# Patient Record
Sex: Female | Born: 1950 | Race: White | Hispanic: No | Smoking: Former smoker
Health system: Southern US, Community
[De-identification: ages and names within clinical notes are randomized; demographics above are authoritative.]

## PROBLEM LIST (undated history)

## (undated) DIAGNOSIS — I739 Peripheral vascular disease, unspecified: Secondary | ICD-10-CM

## (undated) DIAGNOSIS — Z8719 Personal history of other diseases of the digestive system: Secondary | ICD-10-CM

## (undated) DIAGNOSIS — F32A Depression, unspecified: Secondary | ICD-10-CM

## (undated) DIAGNOSIS — Z5189 Encounter for other specified aftercare: Secondary | ICD-10-CM

## (undated) DIAGNOSIS — G43909 Migraine, unspecified, not intractable, without status migrainosus: Secondary | ICD-10-CM

## (undated) DIAGNOSIS — I10 Essential (primary) hypertension: Secondary | ICD-10-CM

## (undated) DIAGNOSIS — M549 Dorsalgia, unspecified: Secondary | ICD-10-CM

## (undated) DIAGNOSIS — K219 Gastro-esophageal reflux disease without esophagitis: Secondary | ICD-10-CM

## (undated) DIAGNOSIS — Q8901 Asplenia (congenital): Secondary | ICD-10-CM

## (undated) DIAGNOSIS — F4310 Post-traumatic stress disorder, unspecified: Secondary | ICD-10-CM

## (undated) DIAGNOSIS — G4730 Sleep apnea, unspecified: Secondary | ICD-10-CM

## (undated) DIAGNOSIS — G4733 Obstructive sleep apnea (adult) (pediatric): Secondary | ICD-10-CM

## (undated) DIAGNOSIS — R739 Hyperglycemia, unspecified: Secondary | ICD-10-CM

## (undated) DIAGNOSIS — F329 Major depressive disorder, single episode, unspecified: Secondary | ICD-10-CM

## (undated) DIAGNOSIS — IMO0001 Reserved for inherently not codable concepts without codable children: Secondary | ICD-10-CM

## (undated) DIAGNOSIS — E669 Obesity, unspecified: Secondary | ICD-10-CM

## (undated) DIAGNOSIS — R011 Cardiac murmur, unspecified: Secondary | ICD-10-CM

## (undated) DIAGNOSIS — G8929 Other chronic pain: Secondary | ICD-10-CM

## (undated) DIAGNOSIS — E785 Hyperlipidemia, unspecified: Secondary | ICD-10-CM

## (undated) DIAGNOSIS — K5900 Constipation, unspecified: Secondary | ICD-10-CM

## (undated) DIAGNOSIS — M1990 Unspecified osteoarthritis, unspecified site: Secondary | ICD-10-CM

## (undated) DIAGNOSIS — F419 Anxiety disorder, unspecified: Secondary | ICD-10-CM

## (undated) HISTORY — DX: Anxiety disorder, unspecified: F41.9

## (undated) HISTORY — DX: Hyperglycemia, unspecified: R73.9

## (undated) HISTORY — DX: Gastro-esophageal reflux disease without esophagitis: K21.9

## (undated) HISTORY — DX: Sleep apnea, unspecified: G47.30

## (undated) HISTORY — DX: Obesity, unspecified: E66.9

## (undated) HISTORY — DX: Hyperlipidemia, unspecified: E78.5

## (undated) HISTORY — DX: Post-traumatic stress disorder, unspecified: F43.10

## (undated) HISTORY — PX: TUBAL LIGATION: SHX77

## (undated) HISTORY — DX: Asplenia (congenital): Q89.01

## (undated) HISTORY — DX: Constipation, unspecified: K59.00

## (undated) HISTORY — PX: COLONOSCOPY: SHX174

## (undated) HISTORY — PX: DILATION AND CURETTAGE OF UTERUS: SHX78

## (undated) HISTORY — PX: HERNIA REPAIR: SHX51

## (undated) HISTORY — PX: EYE SURGERY: SHX253

## (undated) HISTORY — DX: Obstructive sleep apnea (adult) (pediatric): G47.33

## (undated) HISTORY — DX: Essential (primary) hypertension: I10

## (undated) HISTORY — DX: Cardiac murmur, unspecified: R01.1

---

## 1976-09-16 HISTORY — PX: SPLENECTOMY, TOTAL: SHX788

## 1976-09-16 HISTORY — PX: LEG SURGERY: SHX1003

## 1998-03-05 ENCOUNTER — Emergency Department (HOSPITAL_COMMUNITY): Admission: EM | Admit: 1998-03-05 | Discharge: 1998-03-05 | Payer: Self-pay | Admitting: Emergency Medicine

## 1999-09-17 HISTORY — PX: FRACTURE SURGERY: SHX138

## 2000-05-03 ENCOUNTER — Encounter: Payer: Self-pay | Admitting: Emergency Medicine

## 2000-05-04 ENCOUNTER — Encounter: Payer: Self-pay | Admitting: Orthopedic Surgery

## 2000-05-04 ENCOUNTER — Inpatient Hospital Stay (HOSPITAL_COMMUNITY): Admission: EM | Admit: 2000-05-04 | Discharge: 2000-05-07 | Payer: Self-pay | Admitting: Emergency Medicine

## 2000-08-14 ENCOUNTER — Ambulatory Visit (HOSPITAL_COMMUNITY): Admission: RE | Admit: 2000-08-14 | Discharge: 2000-08-14 | Payer: Self-pay | Admitting: Orthopedic Surgery

## 2000-08-14 ENCOUNTER — Encounter: Payer: Self-pay | Admitting: Orthopedic Surgery

## 2000-09-03 ENCOUNTER — Ambulatory Visit (HOSPITAL_COMMUNITY): Admission: RE | Admit: 2000-09-03 | Discharge: 2000-09-03 | Payer: Self-pay | Admitting: Orthopedic Surgery

## 2000-09-03 ENCOUNTER — Encounter: Payer: Self-pay | Admitting: Orthopedic Surgery

## 2000-09-17 ENCOUNTER — Encounter: Payer: Self-pay | Admitting: Orthopedic Surgery

## 2000-09-17 ENCOUNTER — Ambulatory Visit (HOSPITAL_COMMUNITY): Admission: RE | Admit: 2000-09-17 | Discharge: 2000-09-17 | Payer: Self-pay | Admitting: Orthopedic Surgery

## 2000-10-15 ENCOUNTER — Encounter: Payer: Self-pay | Admitting: Orthopedic Surgery

## 2000-10-15 ENCOUNTER — Ambulatory Visit (HOSPITAL_COMMUNITY): Admission: RE | Admit: 2000-10-15 | Discharge: 2000-10-15 | Payer: Self-pay | Admitting: Orthopedic Surgery

## 2001-01-01 ENCOUNTER — Encounter: Payer: Self-pay | Admitting: Family Medicine

## 2001-01-01 ENCOUNTER — Ambulatory Visit (HOSPITAL_COMMUNITY): Admission: RE | Admit: 2001-01-01 | Discharge: 2001-01-01 | Payer: Self-pay | Admitting: Family Medicine

## 2002-04-02 ENCOUNTER — Encounter: Payer: Self-pay | Admitting: Internal Medicine

## 2002-04-02 ENCOUNTER — Ambulatory Visit (HOSPITAL_COMMUNITY): Admission: RE | Admit: 2002-04-02 | Discharge: 2002-04-02 | Payer: Self-pay | Admitting: Internal Medicine

## 2002-04-02 ENCOUNTER — Encounter (INDEPENDENT_AMBULATORY_CARE_PROVIDER_SITE_OTHER): Payer: Self-pay | Admitting: Specialist

## 2002-07-01 ENCOUNTER — Encounter: Payer: Self-pay | Admitting: Orthopedic Surgery

## 2002-07-01 ENCOUNTER — Ambulatory Visit (HOSPITAL_COMMUNITY): Admission: RE | Admit: 2002-07-01 | Discharge: 2002-07-01 | Payer: Self-pay | Admitting: Orthopedic Surgery

## 2002-08-17 ENCOUNTER — Encounter: Admission: RE | Admit: 2002-08-17 | Discharge: 2002-11-15 | Payer: Self-pay | Admitting: Anesthesiology

## 2002-11-22 ENCOUNTER — Encounter: Payer: Self-pay | Admitting: Internal Medicine

## 2002-11-22 ENCOUNTER — Ambulatory Visit (HOSPITAL_COMMUNITY): Admission: RE | Admit: 2002-11-22 | Discharge: 2002-11-22 | Payer: Self-pay | Admitting: Internal Medicine

## 2003-11-07 ENCOUNTER — Encounter: Admission: RE | Admit: 2003-11-07 | Discharge: 2003-11-07 | Payer: Self-pay | Admitting: Family Medicine

## 2003-11-29 ENCOUNTER — Encounter: Admission: RE | Admit: 2003-11-29 | Discharge: 2003-11-29 | Payer: Self-pay | Admitting: Family Medicine

## 2004-01-02 ENCOUNTER — Observation Stay (HOSPITAL_COMMUNITY): Admission: RE | Admit: 2004-01-02 | Discharge: 2004-01-03 | Payer: Self-pay | Admitting: Urology

## 2004-01-09 ENCOUNTER — Emergency Department (HOSPITAL_COMMUNITY): Admission: EM | Admit: 2004-01-09 | Discharge: 2004-01-09 | Payer: Self-pay

## 2004-03-17 ENCOUNTER — Encounter: Admission: RE | Admit: 2004-03-17 | Discharge: 2004-03-17 | Payer: Self-pay | Admitting: Family Medicine

## 2004-07-09 ENCOUNTER — Ambulatory Visit (HOSPITAL_BASED_OUTPATIENT_CLINIC_OR_DEPARTMENT_OTHER): Admission: RE | Admit: 2004-07-09 | Discharge: 2004-07-09 | Payer: Self-pay | Admitting: Family Medicine

## 2004-12-05 IMAGING — CR DG CHEST 1V PORT
1 series · 1 of 1 positions shown · non-contrast
Comparison: [DATE].

CLINICAL DATA: Shortness of breath, chest pain. 
 PORTABLE CHEST - [DATE]:

[view not recorded]
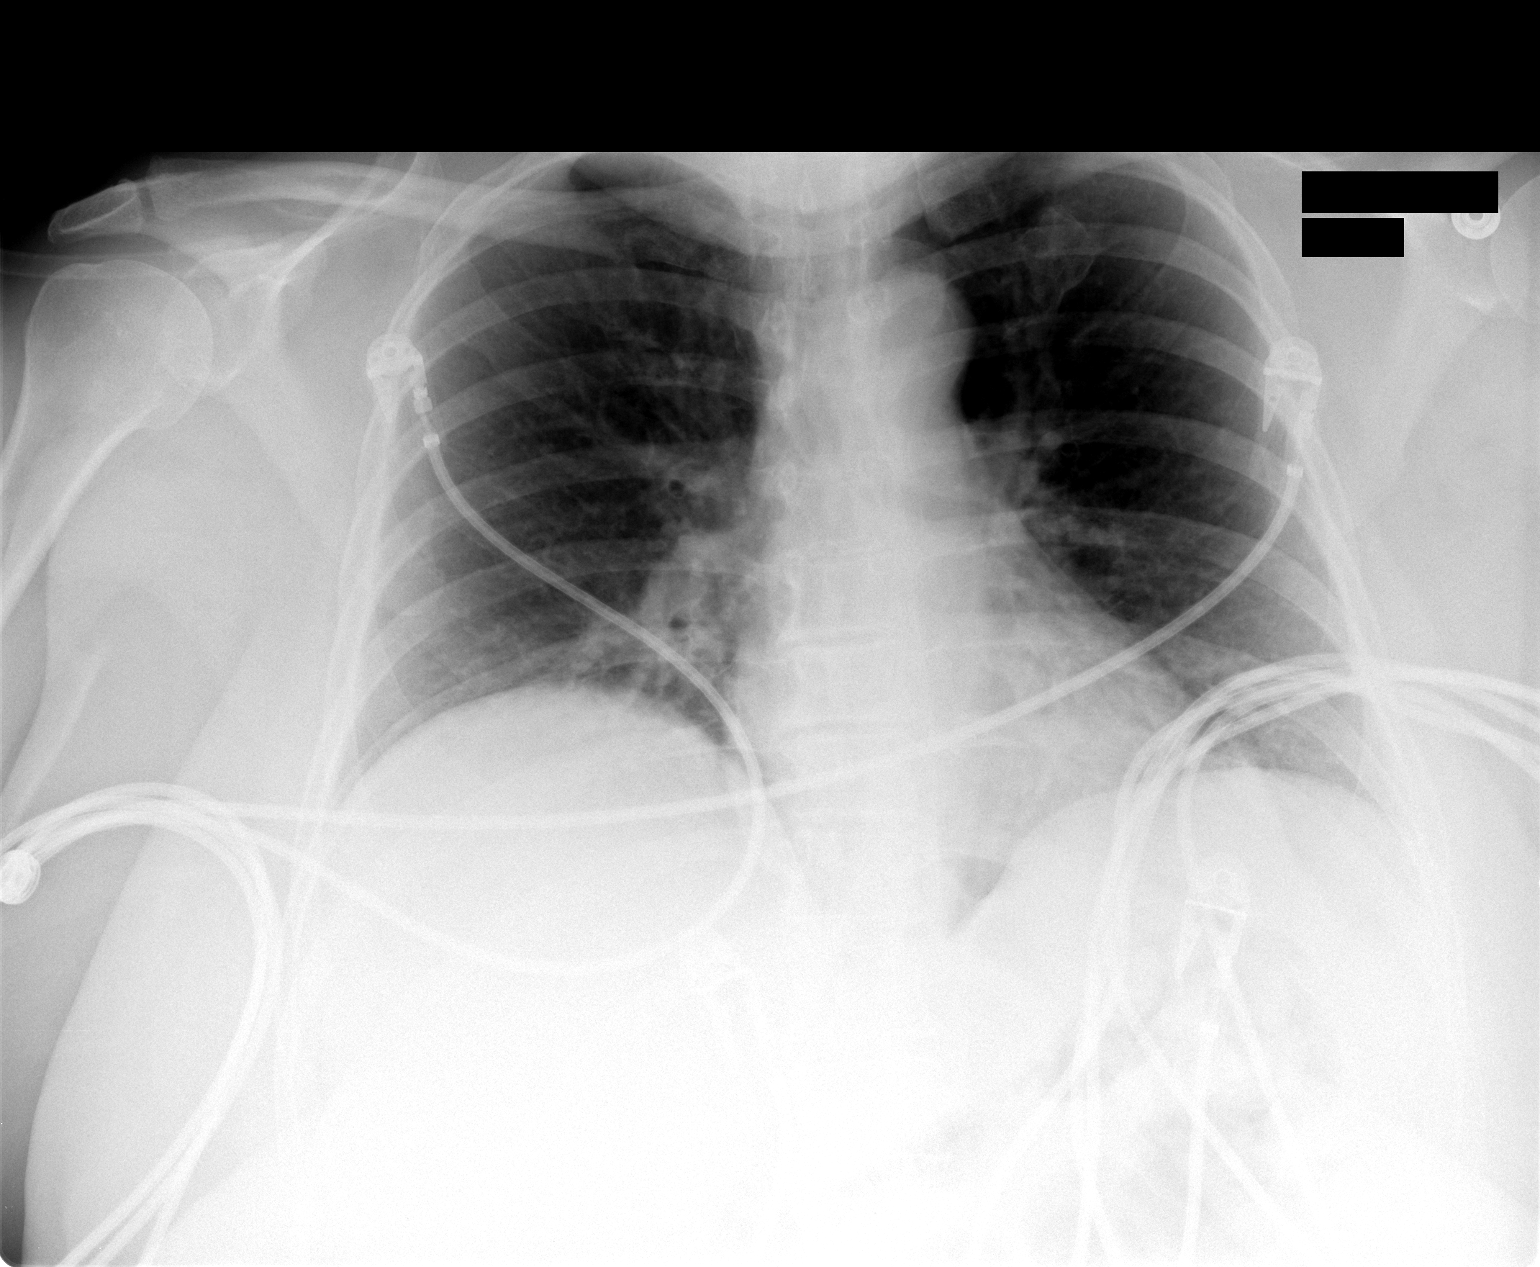

[1 of 1 positions shown; findings below may reference images not displayed]

FINDINGS: Low lung volumes are present causing crowding of the pulmonary vasculature.  When account is made for the low lung volumes, the appearance is similar to the prior exam.  No definite airspace opacity.  Heart size is within normal limits.
IMPRESSION: Low lung volumes.   No acute finding.

## 2004-12-06 ENCOUNTER — Ambulatory Visit: Payer: Self-pay | Admitting: Family Medicine

## 2004-12-06 ENCOUNTER — Observation Stay (HOSPITAL_COMMUNITY): Admission: EM | Admit: 2004-12-06 | Discharge: 2004-12-07 | Payer: Self-pay | Admitting: Emergency Medicine

## 2005-05-30 ENCOUNTER — Emergency Department (HOSPITAL_COMMUNITY): Admission: EM | Admit: 2005-05-30 | Discharge: 2005-05-30 | Payer: Self-pay | Admitting: Emergency Medicine

## 2006-04-16 ENCOUNTER — Encounter (INDEPENDENT_AMBULATORY_CARE_PROVIDER_SITE_OTHER): Payer: Self-pay | Admitting: Specialist

## 2006-04-16 ENCOUNTER — Ambulatory Visit (HOSPITAL_BASED_OUTPATIENT_CLINIC_OR_DEPARTMENT_OTHER): Admission: RE | Admit: 2006-04-16 | Discharge: 2006-04-16 | Payer: Self-pay | Admitting: Orthopedic Surgery

## 2006-07-17 HISTORY — PX: CARPAL TUNNEL RELEASE: SHX101

## 2006-08-06 ENCOUNTER — Ambulatory Visit (HOSPITAL_BASED_OUTPATIENT_CLINIC_OR_DEPARTMENT_OTHER): Admission: RE | Admit: 2006-08-06 | Discharge: 2006-08-06 | Payer: Self-pay | Admitting: Orthopedic Surgery

## 2006-10-08 ENCOUNTER — Ambulatory Visit (HOSPITAL_BASED_OUTPATIENT_CLINIC_OR_DEPARTMENT_OTHER): Admission: RE | Admit: 2006-10-08 | Discharge: 2006-10-08 | Payer: Self-pay | Admitting: Orthopedic Surgery

## 2007-07-23 ENCOUNTER — Ambulatory Visit (HOSPITAL_COMMUNITY): Admission: RE | Admit: 2007-07-23 | Discharge: 2007-07-23 | Payer: Self-pay | Admitting: Family Medicine

## 2007-10-09 ENCOUNTER — Inpatient Hospital Stay (HOSPITAL_COMMUNITY): Admission: RE | Admit: 2007-10-09 | Discharge: 2007-10-11 | Payer: Self-pay | Admitting: Neurosurgery

## 2007-10-09 HISTORY — PX: CERVICAL SPINE SURGERY: SHX589

## 2008-03-16 HISTORY — PX: CARDIAC CATHETERIZATION: SHX172

## 2008-06-14 ENCOUNTER — Encounter: Admission: RE | Admit: 2008-06-14 | Discharge: 2008-06-14 | Payer: Self-pay | Admitting: Neurosurgery

## 2009-10-24 ENCOUNTER — Encounter: Payer: Self-pay | Admitting: Internal Medicine

## 2009-11-03 ENCOUNTER — Encounter: Payer: Self-pay | Admitting: Internal Medicine

## 2009-12-03 ENCOUNTER — Encounter: Admission: RE | Admit: 2009-12-03 | Discharge: 2009-12-03 | Payer: Self-pay | Admitting: Neurosurgery

## 2010-02-02 ENCOUNTER — Encounter: Payer: Self-pay | Admitting: Internal Medicine

## 2010-03-22 ENCOUNTER — Encounter (INDEPENDENT_AMBULATORY_CARE_PROVIDER_SITE_OTHER): Payer: Self-pay | Admitting: *Deleted

## 2010-03-22 ENCOUNTER — Ambulatory Visit: Payer: Self-pay | Admitting: Internal Medicine

## 2010-03-22 DIAGNOSIS — K649 Unspecified hemorrhoids: Secondary | ICD-10-CM | POA: Insufficient documentation

## 2010-03-22 DIAGNOSIS — K921 Melena: Secondary | ICD-10-CM

## 2010-03-22 DIAGNOSIS — K5900 Constipation, unspecified: Secondary | ICD-10-CM | POA: Insufficient documentation

## 2010-04-04 ENCOUNTER — Encounter (INDEPENDENT_AMBULATORY_CARE_PROVIDER_SITE_OTHER): Payer: Self-pay | Admitting: *Deleted

## 2010-04-04 ENCOUNTER — Telehealth: Payer: Self-pay | Admitting: Internal Medicine

## 2010-04-10 ENCOUNTER — Ambulatory Visit: Payer: Self-pay | Admitting: Internal Medicine

## 2010-04-16 ENCOUNTER — Encounter: Payer: Self-pay | Admitting: Internal Medicine

## 2010-04-16 LAB — HM COLONOSCOPY

## 2010-08-02 LAB — HM MAMMOGRAPHY

## 2010-10-18 NOTE — Letter (Signed)
Summary: [facility] Instructions  [facility]  [address] [place], [place] [address]   Phone: [phone]  Fax: [phone]       Lynn [name]    10/28/1950    MRN: [id number]       Procedure Day /Date:  Tuesday 04/10/2010     Arrival Time: 1:30 pm     Procedure Time: 2:30 pm     Location of Procedure:                    _ x_  [facility] (4th Floor)    PREPARATION FOR COLONOSCOPY WITH MIRALAX  Starting 5 days prior to your procedure Thursday 04/05/2010 do not eat nuts, seeds, popcorn, corn, beans, peas,  salads, or any raw vegetables.  Do not take any fiber supplements (e.g. Metamucil, Citrucel, and Benefiber). ____________________________________________________________________________________________________   THE DAY BEFORE  YOUR PROCEDURE:  TUESDAY 7/26  1   Drink clear liquids the entire day-NO SOLID FOOD  2   Do not drink anything colored red or purple.  Avoid juices with pulp.  No orange juice.  3   Drink at least 64 oz. (8 glasses) of fluid/clear liquids during the day to prevent dehydration and help the prep work efficiently.  CLEAR LIQUIDS INCLUDE: Water Jello Ice Popsicles Tea (sugar ok, no milk/cream) Powdered fruit flavored drinks Coffee (sugar ok, no milk/cream) Gatorade Juice: apple, white grape, white cranberry  Lemonade Clear bullion, consomm, broth Carbonated beverages (any kind) Strained chicken noodle soup Hard Candy           THE DAY OF YOUR PROCEDURE:  TUESDAY 7/26  4   Mix the entire bottle of Miralax with 64 oz. of Gatorade/Powerade in the morning and put in the refrigerator to chill.  5   At 5:30 am take 2 Dulcolax/Bisacodyl tablets.  6.   At 7:00 am take one Reglan/Metoclopramide tablet.    7.  Starting at 7:30 am drink one 8 oz glass of the Miralax mixture every 15-20 minutes until you have finished drinking the entire 64 oz.   8   If you are nauseated, you may take the 2nd Reglan/Metoclopramide tablet  at 9:00 am        9    At 10:30 am  take 2 more DULCOLAX/Bisacodyl tablets.       You may drink clear liquids until 12:30 pm  (2 HOURS BEFORE PROCEDURE).   MEDICATION INSTRUCTIONS  Unless otherwise instructed, you should take regular prescription medications with a small sip of water as early as possible the morning of your procedure.    Additional medication instructions: Do not take Beanazapril/HCTZ day of procedure.         OTHER INSTRUCTIONS  You will need a responsible adult at least 58 years of age to accompany you and drive you home.   This person must remain in the waiting room during your procedure.  Wear loose fitting clothing that is easily removed.  Leave jewelry and other valuables at home.  However, you may wish to bring a book to read or an iPod/MP3 player to listen to music as you wait for your procedure to start.  Remove all body piercing jewelry and leave at home.  Total time from sign-in until discharge is approximately 2-3 hours.  You should go home directly after your procedure and rest.  You can resume normal activities the day after your procedure.  The day of your procedure you should not:  Drive   Make legal decisions   Operate machinery   Drink alcohol   Return to work  You will receive specific instructions about eating, activities and medications before you leave.  Instructions will be mailed and reviewed with patient by phone. The above instructions have been reviewed and explained to me by  Ezra Sites RN  April 04, 2010 12:43 PM     I fully understand and can verbalize these instructions _____________________________ Date _______

## 2010-10-18 NOTE — Letter (Signed)
Summary: [facility]  [facility]   Imported By: Sherian Rein 03/23/2010 12:18:04  _____________________________________________________________________  External Attachment:    Type:   Image     Comment:   External Document

## 2010-10-18 NOTE — Progress Notes (Signed)
Summary: prep ?  Phone Note Call from Patient Call back at Home Phone [phone]   Caller: Patient Call For: Dr. Leone Payor Reason for Call: Talk to Nurse Summary of Call: cannot afford prep Initial call taken by: Vallarie Mare,  April 04, 2010 11:48 AM  Follow-up for Phone Call        Pt says Medicaid will not pay for Moviprep. Will call in Rx for Miralax, Duccolax, and Reglan.  Will mail new prep instructions to patient and she will call us when she receives instructions to review with her. Follow-up by: Ezra Sites RN,  April 04, 2010 12:24 PM

## 2010-10-18 NOTE — Procedures (Signed)
Summary: Colonoscopy: Hemorrhoids   Colonoscopy  Procedure date:  04/02/2002  Findings:      Results: Hemorrhoids.     Pathology:  Hyperplastic polyp.     Location:  [facility].    Procedures Next Due Date:    Colonoscopy: 04/2012  Patient Name: Lynn [name], Lynn [name] MRN: [id number] Procedure Procedures: Colonoscopy CPT: 19147.    with biopsy. CPT: Q5068410.  Personnel: Endoscopist: Iva Boop, MD, [facility].  Referred By: Marcos Eke. Hal Hope, MD.  Exam Location: Exam performed in Endoscopy Suite. Outpatient  Patient Consent: Procedure, Alternatives, Risks and Benefits discussed, consent obtained,  Indications Symptoms: Constipation Hematochezia.  History  Pre-Exam Physical: Performed Apr 02, 2002. Cardio-pulmonary exam, HEENT exam , Abdominal exam, Mental status exam WNL.  Exam Exam: Extent of exam reached: Cecum, extent intended: Cecum.  Patient position: on left side. Colon retroflexion performed. Images taken. ASA Classification: II. Tolerance: good.  Monitoring: Pulse and BP monitoring, Oximetry used. Supplemental O2 given.  Colon Prep Used Visicol for colon prep. Prep results: fair, adequate exam.  Fluoroscopy: Fluoroscopy was not used.  Sedation Meds: Fentanyl 25 mcg. given IV. Versed 2 mg. given IV.  Findings - NORMAL EXAM: Cecum to Sigmoid Colon.  POLYP: Sigmoid Colon, diminutive, sessile polyp. Distance from Anus 20 cm. removed, retrieved, Polyp sent to pathology. ICD9: Colon Polyps: 211.3.  HEMORRHOIDS: External. Size: Grade II. Not bleeding. Not thrombosed. ICD9: Hemorrhoids, External: 455.3. Comments: Hypertrophied anal papillae also. Likely source of bleeding.   Assessment Abnormal examination, see findings above.  Diagnoses: 211.3: Colon Polyps.  455.3: Hemorrhoids, External.   Events  Unplanned Interventions: No intervention was required.  Plans Medication Plan: Continue current medications. Anti-constipation: Zelnorm   Patient  Education: Patient given standard instructions for: Polyps. Hemorrhoids.  Disposition: After procedure patient sent to recovery.  Scheduling/Referral: Await pathology to schedule patient.  Comments: If polyp is adenpmatous would repeat colonoscopy in 5 years. If not, routine colon cancer screening.  CC:   Nadyne Coombes, MD  This report was created from the original endoscopy report, which was reviewed and signed by the above listed endoscopist.

## 2010-10-18 NOTE — Procedures (Signed)
Summary: EGD:    EGD  Procedure date:  04/02/2002  Findings:      Findings: Esophagitis  Findings: Gastritis  Findings: Ulcer--Duodenal Findings: Hiatal Hernia Location: [facility]   Patient Name: Lynn [name], Lynn [name] MRN: [id number] Procedure Procedures: Panendoscopy (EGD) CPT: 43235.    with biopsy(s)/brushing(s). CPT: D1846139.    with esophageal dilation. CPT: G9296129.  Personnel: Endoscopist: Iva Boop, MD, [facility].  Referred By: Marcos Eke. Hal Hope, MD.  Exam Location: Exam performed in Endoscopy Suite. Outpatient  Patient Consent: Procedure, Alternatives, Risks and Benefits discussed, consent obtained,  Comments: 56 Fr. Elease Hashimoto passed without heme. Indications Symptoms: Dysphagia. Abdominal pain, location: RUQ.  History  Pre-Exam Physical: Performed Apr 02, 2002  Cardio-pulmonary exam, HEENT exam, Abdominal exam, Mental status exam WNL.  Exam Exam Info: Maximum depth of insertion Duodenum, intended Duodenum. Patient position: on left side. Vocal cords visualized. Gastric retroflexion performed. Images taken. ASA Classification: II. Tolerance: excellent.  Sedation Meds: Fentanyl 100 mcg. given IV. Versed 10 mg. given IV. Cetacaine Spray 2 sprays given aerosolized.  Monitoring: BP and pulse monitoring done. Oximetry used. Supplemental O2 given  Fluoroscopy: Fluoroscopy was not used.  Findings ESOPHAGEAL INFLAMMATION: suspected as a result of reflux. Severity is mild, erythema only.  Proximal margin 39 cm from mouth,  distal margin 39 cm. Edema present. [place] Classification: Grade 0. ICD9: Esophagitis, Reflux: 530.11.  HIATAL HERNIA: Diaphragm 39 cm from mouth. Z-line/GE Junction 40 cm from mouth. Regular, 1 cms. in length. ICD9: Hernia, Hiatal: 553.3. ULCER: in Duodenal Bulb Minimum Size: 4 mm. Maximum size: 5 mm. Superficial ulcers x 2. An image was taken. RUT done, results pending.  ICD9: Ulcer, Duodenal, Acute without Hemorrhage: 532.30.    MUCOSAL ABNORMALITY: Antrum. Erythematous mucosa. Biopsy/Mucosal Abn taken. RUT done, results pending. ICD9: Gastritis, Unspecified: 535. 50.  Normal: Duodenal Apex to Duodenal 2nd Portion.   Assessment Abnormal examination, see findings above.  Diagnoses: 530.11: Esophagitis, Reflux.  553.3: Hernia, Hiatal.  535.50: Gastritis, Unspecified.  532.30: Ulcer, Duodenal, Acute without Hemorrhage.   Comments: Mild esophagitis, small superficial ulcers in bulb. Events  Unplanned Intervention: No unplanned interventions were required.  Plans Medication(s): PPI: Aciphex 20 mg QD, starting Apr 02, 2002 for indefinitely.   Patient Education: Patient given standard instructions for: Ulcer. Reflux.  Comments: Will treat H. pylori if positive. PPI should treat reflux and ulcers.  Disposition: After procedure patient sent to recovery.  Scheduling: Clinic Visit, to Clydie Braun L. Hal Hope, MD, prn   Comments: Return to GI as needed. If persistent dysphagia consider Ba swallow.  CC:   Nadyne Coombes, MD  This report was created from the original endoscopy report, which was reviewed and signed by the above listed endoscopist.

## 2010-10-18 NOTE — Letter (Signed)
Summary: Patient Notice-Hyperplastic Polyps  [facility]  [address] [place], [place] [address]   Phone: [phone]  Fax: [phone]        April 16, 2010 MRN: [id number]    [facility] [address] [place], [place]  [address]    Dear [name],  I am pleased to inform you that the colon polyps removed during your recent colonoscopy were found to be hyperplastic.  These types of polyps are NOT pre-cancerous.  It is therefore my recommendation that you have a repeat colonoscopy examination in 10 years for routine colorectal cancer screening.  Should you develop new or worsening symptoms of abdominal pain, bowel habit changes or bleeding from the rectum or bowels, please schedule an evaluation with either your primary care physician or with me.  Please call us if you are having persistent problems or have questions about your condition that have not been fully answered at this time.   Sincerely,  Iva Boop MD, [facility] This letter has been electronically signed by your physician.  Appended Document: Patient Notice-Hyperplastic Polyps Letter mailed 8.3.2011

## 2010-10-18 NOTE — Letter (Signed)
Summary: [facility] Instructions  [facility]  [address] [place], [place] [address]   Phone: [phone]  Fax: [phone]       Lynn [name]    03-03-1951    MRN: [id number]      Procedure Day Dorna Bloom: TUESDAY, April 10, 2010     Arrival Time: 1:30 PM      Procedure Time: 2:30 PM    Location of Procedure:                    _X_  [facility] (4th Floor)  PREPARATION FOR COLONOSCOPY WITH MOVIPREP   Starting 5 days prior to your procedure 04/05/10 do not eat nuts, seeds, popcorn, corn, beans, peas,  salads, or any raw vegetables.  Do not take any fiber supplements (e.g. Metamucil, Citrucel, and Benefiber).  THE DAY BEFORE YOUR PROCEDURE         MONDAY, 04/09/10  1.  Drink clear liquids the entire day-NO SOLID FOOD  2.  Do not drink anything colored red or purple.  Avoid juices with pulp.  No orange juice.  3.  Drink at least 64 oz. (8 glasses) of fluid/clear liquids during the day to prevent dehydration and help the prep work efficiently.  CLEAR LIQUIDS INCLUDE: Water Jello Ice Popsicles Tea (sugar ok, no milk/cream) Powdered fruit flavored drinks Coffee (sugar ok, no milk/cream) Gatorade Juice: apple, white grape, white cranberry  Lemonade Clear bullion, consomm, broth Carbonated beverages (any kind) Strained chicken noodle soup Hard Candy                             4.  In the morning, mix first dose of MoviPrep solution:    Empty 1 Pouch A and 1 Pouch B into the disposable container    Add lukewarm drinking water to the top line of the container. Mix to dissolve    Refrigerate (mixed solution should be used within 24 hrs)  5.  Begin drinking the prep at 5:00 p.m. The MoviPrep container is divided by 4 marks.   Every 15 minutes drink the solution down to the next mark (approximately 8 oz) until the full liter is complete.   6.  Follow completed prep with 16 oz of clear liquid of your choice (Nothing red or purple).  Continue to drink  clear liquids until bedtime.  7.  Before going to bed, mix second dose of MoviPrep solution:    Empty 1 Pouch A and 1 Pouch B into the disposable container    Add lukewarm drinking water to the top line of the container. Mix to dissolve    Refrigerate  THE DAY OF YOUR PROCEDURE      TUESDAY, 04/10/10  Beginning at 9:30 a.m. (5 hours before procedure):         1. Every 15 minutes, drink the solution down to the next mark (approx 8 oz) until the full liter is complete.  2. Follow completed prep with 16 oz. of clear liquid of your choice.    3. You may drink clear liquids until 12:30 PM (2 HOURS BEFORE PROCEDURE).  MEDICATION INSTRUCTIONS  Unless otherwise instructed, you should take regular prescription medications with a small sip of water   as early as possible the morning of your procedure.       OTHER INSTRUCTIONS  You will need a responsible adult at least 58 years of age to accompany you and drive you  home.   This person must remain in the waiting room during your procedure.  Wear loose fitting clothing that is easily removed.  Leave jewelry and other valuables at home.  However, you may wish to bring a book to read or  an iPod/MP3 player to listen to music as you wait for your procedure to start.  Remove all body piercing jewelry and leave at home.  Total time from sign-in until discharge is approximately 2-3 hours.  You should go home directly after your procedure and rest.  You can resume normal activities the  day after your procedure.  The day of your procedure you should not:   Drive   Make legal decisions   Operate machinery   Drink alcohol   Return to work  You will receive specific instructions about eating, activities and medications before you leave.  The above instructions have been reviewed and explained to me by   Annabell Sabal, CMA (AAMA)    I fully understand and can verbalize these instructions _____________________________ Date  _________
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
## 2010-10-18 NOTE — Procedures (Signed)
Summary: Colonoscopy  Patient: Lynn [name] Note: All result statuses are Final unless otherwise noted.  Tests: (1) Colonoscopy (COL)   COL Colonoscopy           DONE     [facility]     [address]     [place], [place]  [address]           COLONOSCOPY PROCEDURE REPORT           PATIENT:  [name], [name]  MR#:  [id number]     BIRTHDATE:  Sep 21, 1950, 58 yrs. old  GENDER:  female     ENDOSCOPIST:  Iva Boop, MD, [facility]     REF. BY:  Lynnea Ferrier, [name].     PROCEDURE DATE:  04/10/2010     PROCEDURE:  Colonoscopy with snare polypectomy     ASA CLASS:  Class III     INDICATIONS:  hematochezia     MEDICATIONS:   Fentanyl 100 mcg, Versed 9 mg IV           DESCRIPTION OF PROCEDURE:   After the risks benefits and     alternatives of the procedure were thoroughly explained, informed     consent was obtained.  Digital rectal exam was performed and     revealed small external hemorrhoids.   The LB CF-H180AL [id number]     endoscope was introduced through the anus and advanced to the     cecum, which was identified by both the appendix and ileocecal     valve, without limitations.  The quality of the prep was adequate,     using MoviPrep.  The instrument was then slowly withdrawn as the     colon was fully examined.     Insertion: 2:46 minutes Withdrawal: 13:00 minutes     <<PROCEDUREIMAGES>>           FINDINGS:  There were multiple polyps identified and removed.     Ascending (7mm), sigmoid (3 polyps 2-54mm). Three polyps were     snared without cautery. Retrieval was successful. One sigmoid     polyp was snared without cautery and retrieval was unsuccessful.     External hemorrhoids were found.  This was otherwise a normal     examination of the colon.   Retroflexed views in the rectum     revealed no abnormalities.    The scope was then withdrawn from     the patient and the procedure completed.           COMPLICATIONS:  None     ENDOSCOPIC IMPRESSION:     1) Polyps,  multiple (4 removed, 3 recovered)     2) External hemorrhoids     3) Otherwise normal examination     RECOMMENDATIONS:     Continue MiraLax (as needed) for constipation. This should help     reduce or elimnate bleeding from hemorrhoids.     REPEAT EXAM:  In for Colonoscopy, pending biopsy results.           Iva Boop, MD, [facility]           CC:  Lynnea Ferrier, MD     The Patient           n.     eSIGNED:   Iva Boop at 04/10/2010 02:52 PM           Ree Kida, [id number]  Note: An exclamation mark (!) indicates a result that was not dispersed into  the flowsheet. Document Creation Date: 04/10/2010 2:52 PM _______________________________________________________________________  (1) Order result status: Final Collection or observation date-time: 04/10/2010 14:33 Requested date-time:  Receipt date-time:  Reported date-time:  Referring Physician:   Ordering Physician: Stan Head [phone]) Specimen Source:  Source: Launa Grill Order Number: [id number] Lab site:   Appended Document: Colonoscopy     Procedures Next Due Date:    Colonoscopy: 04/2020

## 2010-10-19 NOTE — Letter (Signed)
Summary: [facility]  [facility]   Imported By: Sherian Rein 03/23/2010 12:26:49  _____________________________________________________________________  External Attachment:    Type:   Image     Comment:   External Document

## 2010-12-14 ENCOUNTER — Encounter: Payer: Self-pay | Admitting: Internal Medicine

## 2010-12-14 DIAGNOSIS — E782 Mixed hyperlipidemia: Secondary | ICD-10-CM | POA: Insufficient documentation

## 2010-12-14 DIAGNOSIS — F410 Panic disorder [episodic paroxysmal anxiety] without agoraphobia: Secondary | ICD-10-CM | POA: Insufficient documentation

## 2010-12-14 DIAGNOSIS — I10 Essential (primary) hypertension: Secondary | ICD-10-CM | POA: Insufficient documentation

## 2010-12-14 DIAGNOSIS — E559 Vitamin D deficiency, unspecified: Secondary | ICD-10-CM | POA: Insufficient documentation

## 2010-12-14 DIAGNOSIS — K219 Gastro-esophageal reflux disease without esophagitis: Secondary | ICD-10-CM

## 2010-12-14 DIAGNOSIS — F4310 Post-traumatic stress disorder, unspecified: Secondary | ICD-10-CM | POA: Insufficient documentation

## 2010-12-14 DIAGNOSIS — E785 Hyperlipidemia, unspecified: Secondary | ICD-10-CM

## 2010-12-14 NOTE — Assessment & Plan Note (Signed)
Followed by Dr. Caryn Section

## 2011-01-29 NOTE — Op Note (Signed)
Lynn [name], Lynn [name]               ACCOUNT NO.:  [id number]   MEDICAL RECORD NO.:  [id number]          PATIENT TYPE:  INP   LOCATION:  3172                         FACILITY:  [facility]   PHYSICIAN:  [name], [name].  DATE OF BIRTH:  Jun 19, 1951   DATE OF PROCEDURE:  10/09/2007  DATE OF DISCHARGE:                               OPERATIVE REPORT   PREOPERATIVE DIAGNOSIS:  Herniated cervical disk with spondylosis,  cervical disk degeneration and radiculopathy C4-5, C5-6, C6-7 levels.   POSTOPERATIVE DIAGNOSIS:  Herniated cervical disk with spondylosis,  cervical disk degeneration and radiculopathy C4-5, C5-6, C6-7 levels.   PROCEDURE:  Anterior cervical decompression and fusion C4-5, C5-6 and C6-  7 levels with allograft bone graft, morselized bone autograft and  anterior cervical plate.   SURGEON:  [name], [name].   ASSISTANT:  Cristi Loron, [name]., and Georgiann Cocker, R.N.   ANESTHESIA:  General endotracheal anesthesia.   BLOOD LOSS:  Minimal.   COMPLICATIONS:  None.   DISPOSITION:  To recovery.   INDICATIONS:  Lynn [name] was a 60 year old woman with multilevel  cervical spondylosis and foraminal stenosis with right upper extremity  pain and weakness.  It was elected to take her to surgery for anterior  cervical decompression and fusion C4-5, C5-6 and C6-7 levels.   PROCEDURE:  Lynn [name] was brought to the operating room.  Following  satisfactory and uncomplicated induction of general endotracheal  anesthesia and placement of intravenous lines,  her neck was placed in  slight extension.  She was placed in 5 pounds of Halter retraction.  Anterior neck was then prepped and draped in the usual sterile fashion.  Planned incision was infiltrated with 0.25% Marcaine and 0.50% lidocaine  with 1:200,000 epinephrine.  Incision was made, carried through  platysmal layer.  Subplatysmal dissection was performed exposing the  anterior border of sternocleidomastoid muscle  using blunt dissection.  Carotid sheath was kept lateral, and the trachea and esophagus kept  medial exposing the anterior cervical spine.  A bent spinal needle was  placed over what was felt to the C4-5 level on intraoperative x-ray, did  not visualize this level well, so consequently an additional x-ray was  obtained with a marker at the C3-4 and C4-5 level, and on this x-ray,  the upper marker was clearly demonstrated.  Subsequently, anterior  cervical spine was exposed further with take down of longus colli  muscles bilaterally using electrocautery and Key elevator, and self-  retaining Shadow-Line retractor was placed along with up and down  retraction.  Ventral osteophytes were removed with a Leksell rongeur.  The interspaces at C4-5, C5-6 and C6-7 appeared to be highly  degenerated, and each was incised.  Disk material was removed in a  piecemeal fashion.  Endplates were stripped of residual disk material,  and subsequently distraction pins were placed initially at C6 and C7,  and using the high-speed drill, end plates were decorticated and  uncinate spurs drilled down, and subsequently under loupe magnification,  the central spinal cord dura was decompressed as were both the C7 nerve  roots.  Hemostasis was  assured, and after trial sizing, a 7-mm allograft  bone wedge was selected, fashioned with a high-speed drill, packed with  morcellized bone autograft and also demineralized bone matrix and tamped  into the interspace countersunk appropriately.  Attention was then  turned the C5-6 level where a similar decompression was performed, and  both C6 nerve roots were widely decompressed as they extend to the  neural foramina along with the spinal cord dura.  Hemostasis again  assured, and after trial sizing, a similarly sized graft was placed,  packed again with morselized bone autograft, and demineralized bone  matrix.  At the C4-5 level where a similar decompression was performed,   spinal cord dura and both C5 nerve roots were decompressed.  Hemostasis  was assured, and after trial sizing, a 6-mm bone graft was selected,  fashioned with a high-speed drill, packed with morselized bone autograft  and allograft and demineralized bone matrix inserted in the interspace  and countersunk appropriately.  A 54-mm Trestle anterior cervical plate  was then affixed to the anterior cervical spine using variable angle 14-  mm screws, two at C4, two at C5, two at C6, two at C7.  All screws had  excellent purchase.  Locking mechanisms were engaged.  Final x-ray was  obtained which demonstrated the upper aspect of the plate at C4, but  none of the remainder of the cervical spine was visualized.  Hemostasis  was assured.  The soft tissues inspected and found to be in good repair.  A #7 JP drain was placed through a separate stab incision, anchored with  a separate stitch.  The platysma layer was closed with 3-0 Vicryl  sutures, and the skin edges were reapproximated with 3-0 Vicryl  interrupted inverted sutures, and the wound was dressed with Benzoin and  Steri-Strips telfa gauze and tape.  The patient was extubated in the  operating room, taken to recovery, having tolerated the procedure well.      [name], [name].  Electronically Signed     JDS/MEDQ  D:  10/09/2007  T:  10/09/2007  Job:  [id number]

## 2011-02-01 NOTE — Procedures (Signed)
Lynn [name], [name]               ACCOUNT NO.:  [id number]   MEDICAL RECORD NO.:  [id number]          PATIENT TYPE:  OUT   LOCATION:  SLEEP CENTER                 FACILITY:  [facility]   PHYSICIAN:  Clinton D. Maple Hudson, [name]. DATE OF BIRTH:  07-16-1951   DATE OF STUDY:  07/09/2004                              NOCTURNAL POLYSOMNOGRAM   REFERRING PHYSICIAN:  Ernestina Penna, [name].   INDICATION FOR STUDY AND HISTORY:  1.  Hypersomnia with sleep apnea.  2.  Complaint of nightmares.   Epworth sleepiness score , neck size 19 inches, BMI 49, weight 315 pounds.   MEDICATIONS:  None listed.   SLEEP ARCHITECTURE:  She took all routine medications before arrival except  that she took Xanax at 10:53 p.m.  Total sleep time 310 minutes with sleep  efficiency of 78%.  Stage I was 15%; stage II, 77%; Stages III and IV were  absent.  REM was 7% of total sleep time.  Sleep latency was 42 minutes, REM  latency was 328 minutes.  Awake after sleep onset 45 minutes.  Arousal index  49 which is increased and corresponded fairly closely with respiratory  events.   RESPIRATORY DATA:  Split study protocol.  RDI 98.4 per hour indicating very  severe obstructive sleep apnea/hypopnea syndrome before CPAP.  This included  28 obstructive apneas and 182 hypopneas before CPAP.  Most events were while  sleeping supine.  REM RDI was 2.6  per hour.  CPAP was titrated to 16 CWP,  RDI 0.9 per our using a small Respironics Comfort gel nasal mask with heated  humidifier and chin strap.   OXYGEN DATA:  Moderate snoring with oxygen desaturation to a nadir of 84%  before CPAP.  After CPAP control, oxygen saturation held 95 to 96% on room  air.   CARDIAC DATA:  Sinus rhythm wit occasional PVCs.   MOVEMENT AND PARASOMNIA:  One hundred limb jerks were recorded of which 10  were associated with arousal or awakening for a periodic limb movement with  arousal index of 1.9 per hour which is probably insignificant.  No sleep  talking, sleep walking, or nightmares/night terror disturbance was noted by  the technician.   IMPRESSION AND RECOMMENDATIONS:  1.  Severe obstructive sleep apnea/hypopnea syndrome, RDI 98.4 per hour with      desaturation to 94%.  CPAP titration to 16 CWP using a small Respironics      Comfort gel nasal mask with heated humidifier and chin strap.   Clinton D. Maple Hudson, [name].  Diplomate, Biomedical engineer of Sleep Medicine                                                           Clinton D. Maple Hudson, [name].  Diplomate, American Board   CDY/MEDQ  D:  07/15/2004 09:04:20  T:  07/15/2004 10:05:37  Job:  [id number]
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
## 2011-02-01 NOTE — Op Note (Signed)
NAMERICHARDINE, PEPPERS               ACCOUNT NO.:  [id number]   MEDICAL RECORD NO.:  [id number]          PATIENT TYPE:  AMB   LOCATION:  DSC                          FACILITY:  [facility]   PHYSICIAN:  Cindee Salt, [name].       DATE OF BIRTH:  September 06, 1951   DATE OF PROCEDURE:  10/08/2006  DATE OF DISCHARGE:                               OPERATIVE REPORT   PREOPERATIVE DIAGNOSIS:  Carpal tunnel syndrome, right hand.   POSTOPERATIVE DIAGNOSIS:  Carpal tunnel syndrome, right hand.   OPERATION:  Decompression, right median nerve.   SURGEON:  Cindee Salt, [name].   ASSISTANT:  Carolyne Fiscal, RN   ANESTHESIA:  Forearm-based IV regional.   HISTORY:  The patient is a 60 year old female with a history of carpal  tunnel syndrome, EMG nerve conductions positive, which has not responded  to conservative treatment.  She is desirous of proceeding to have this  released.   Postoperative course is discussed along with the risks and  complications.  She is aware there is no guarantee with the surgery, the  possibility of infection, recurrence, injury to arteries, nerves,  tendons, incomplete relief of symptoms, dystrophy.  She has elected to  proceed.  The preoperative area she is seen, questions encouraged and  answered, the extremity marked by both the patient and surgeon.   PROCEDURE:  The patient is brought to the operating room, where a  forearm based-IV regional anesthetic was carried out without difficulty.  She was prepped using DuraPrep, supine position, right arm free.  A  longitudinal incision was made in the palm, carried down through  subcutaneous tissue.  Bleeders were electrocauterized.  The palmar  fascia was split, superficial palmar arch identified, the flexor tendon  to the ring and little finger identified to the ulnar side of the median  nerve.  The carpal retinaculum was incised with sharp dissection.  A  angle and Sewell retractor were placed between the skin and forearm  fascia.  The  fascia was released for approximately 1.5 cm proximal to  the wrist crease under direct vision.  The canal was explored and no  further lesions were identified.  Area of compression to the nerve was  immediately apparent.  The wound was irrigated.  The skin was then  closed with interrupted 5-0 nylon sutures.  A sterile compressive  dressing and splint was applied.  The patient tolerated the procedure  well and was taken to the recovery room for observation in satisfactory  condition.  She is discharged home to return to the [facility]  [place] in 1 week on [place] NX.           ______________________________  Cindee Salt, [name].     GK/MEDQ  D:  10/08/2006  T:  10/08/2006  Job:  [id number]
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
## 2011-02-01 NOTE — Op Note (Signed)
[name], [name]               ACCOUNT NO.:  [id number]   MEDICAL RECORD NO.:  [id number]          PATIENT TYPE:  AMB   LOCATION:  DSC                          FACILITY:  [facility]   PHYSICIAN:  Cindee Salt, [name].       DATE OF BIRTH:  [birth year]   DATE OF PROCEDURE:  04/16/2006  DATE OF DISCHARGE:                                 OPERATIVE REPORT   PREOPERATIVE DIAGNOSIS:  Carpal boss left hand.   POSTOPERATIVE DIAGNOSIS:  Carpal boss left hand.   OPERATION:  Excision carpal boss left hand.   SURGEON:  Dr. Cindee Salt.   ASSISTANT:  Carolyne Fiscal, R.N.   ANESTHESIA:  Forearm based IV regional.   HISTORY:  The patient is a 60 year old female referred for a mass on the  dorsal aspect of her left hand.  This is hard, firm, bony in nature.  X-rays  reveal carpal boss present at the base of the index, middle carpometacarpal  joint.  She is aware of the risks and complications including infection,  recurrence and instability.  She is desirous of proceeding.  She is also  aware of injury to the radial nerve, sensory branches and dystrophy.  In the  preoperative area, questions are entertained, encouraged and answered.  The  area marked by both the patient and surgeon.   PROCEDURE:  The patient was brought to the operating room where a forearm  based IV regional anesthetic was carried out without difficulty.  She was  prepped using DuraPrep, supine position, left arm free.  After 3-minute dry  time, she was draped.  A transverse incision was made over the mass, carried  down through subcutaneous tissue.  Bleeders were electrocauterized.  The  radial nerve was identified and protected.  Dissection carried down to the  mass which was immediately apparent. With blunt and sharp dissection, this  was isolated. A large bony excrescent was present.  This was removed down to  the level of the carpometacarpal joint.  This was saucerization until normal  cartilage was identified, no gross instability was  noted.  The wound was  irrigated.  The specimen was sent to pathology.  The periosteum was then  closed with figure-of-eight 4-0 Vicryl sutures.  The extensor tendons were  not violated. The skin was closed with a subcuticular 4-0 Monocryl suture.  Steri-Strips were applied.  A sterile compressive dressing and splint  applied.  The patient tolerated the procedure well and was taken to the  recovery room for observation in satisfactory condition.  She is discharged  home to return to the [facility] [place] in 1 week on Vicodin.           ______________________________  Cindee Salt, [name].    GK/MEDQ  D:  04/16/2006  T:  04/16/2006  Job:  [id number]   cc:   Ernestina Penna, [name].
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
## 2011-02-01 NOTE — Consult Note (Signed)
NAME:  Lynn [name], Lynn [name]                         ACCOUNT NO.:  [id number]   MEDICAL RECORD NO.:  [id number]                   PATIENT TYPE:  REC   LOCATION:  TPC                                  FACILITY:  [facility]   PHYSICIAN:  Zachary George, DO                      DATE OF BIRTH:  05/28/1951   DATE OF CONSULTATION:  11/08/2002  DATE OF DISCHARGE:                                   CONSULTATION   HISTORY OF PRESENT ILLNESS:  The patient returns to clinic today for  reevaluation.  She was last seen on September 28, 2002.  She continues to do  very well in terms of her low back pain which has improved following  lumbar  epidural steroid injections.  She denies any numbness and paresthesias in  her right lower extremity at this point.  Her pain is 4/10 on a subjective  scale and quite tolerable.  She was unable to take Celebrex secondary to  side effects.  Overall her  function and quality of life indices have  improved.  Her sleep has been variable and improve typically with  Alprazolam.  She does describe some cramping in her lumbar paraspinal  muscles from time to time as well as a drawing sensation in the plantar  aspects of her feet bilaterally.  I reviewed the health and history form and  14-point review of systems.   PHYSICAL EXAMINATION:  GENERAL APPEARANCE:  A healthy female in no acute  distress.  VITAL SIGNS:  Blood pressure 125/66, pulse 78, respiratory rate 20, O2  saturation 98% on room air.  BACK:  Examination of the back reveals level pelvis without scoliosis.  There is minimal tenderness to palpation of the lumbar paraspinals but some  tightness noted.  Range of motion is functional.  NEUROLOGIC:  Manual muscle testing is 5/5 bilateral lower extremities with  the exception of 3/5 left ankle dorsiflexion.  Sensory examination is intact  to light touch bilateral lower extremities.  Muscle stretch reflexes are  diminished bilateral lower extremities but symmetric.    IMPRESSION:  1. Degenerative disc disease of lumbar spine.  2. Low back pain with right lower extremity radicular symptoms, greatly     improved.  Radicular symptoms have essentially resolved.  3. Nonrestorative sleep disorder.   PLAN:  1. Discuss further treatment options with the patient.  I will give her a     trial of Flexeril 10 mg one p.o. q.h.s. as needed #30 with two refills to     help with sleep as well as intermittent lumbar spasms.  This should also     help with the cramping in her feet.  2. The patient to continue with her home exercise program.  3. The patient is to return to clinic in three months for reevaluation.   The patient was educated on the above findings and recommendations and  understands.  There were no barriers to communication.                                               Zachary George, DO    JW/MEDQ  D:  11/08/2002  T:  11/08/2002  Job:  [id number]   cc:   Reinaldo Meeker, [name].  [address]  [place]  [place] [address]  Fax: [phone]

## 2011-02-01 NOTE — Op Note (Signed)
Lynn [name], Lynn [name]               ACCOUNT NO.:  [id number]   MEDICAL RECORD NO.:  [id number]          PATIENT TYPE:  AMB   LOCATION:  DSC                          FACILITY:  [facility]   PHYSICIAN:  Cindee Salt, [name].       DATE OF BIRTH:  April 03, 1951   DATE OF PROCEDURE:  08/06/2006  DATE OF DISCHARGE:                                 OPERATIVE REPORT   PREOPERATIVE DIAGNOSIS:  Carpal tunnel syndrome, left hand.   POSTOPERATIVE DIAGNOSIS:  Carpal tunnel syndrome, left hand.   OPERATION:  Decompression, left median nerve.   SURGEON:  Cindee Salt, [name].   ASSISTANT:  Carolyne Fiscal R.N.   ANESTHESIA:  Forearm based IV regional.   HISTORY:  The patient is a 60 year old female with a history of carpal  tunnel syndrome, EMG nerve conductions positive not responsive to  conservative treatment.  She is admitted now for decompression of the median  nerve.  She is aware of risks and complications including incomplete relief  of symptoms, dystrophy, infection, recurrence, injury to arteries, nerves,  tendons, possibility of a period of pain prior to return of sensation due to  the changes on nerve conduction.  In the preoperative area, she is seen,  questions encouraged and answered.  The extremity marked by both the patient  and surgeon.   PROCEDURE:  The patient is brought to the operating room where a IV regional  anesthetic forearm based was carried out without difficulty.  She was  prepped using DuraPrep, supine position, left arm free.  After 3-minute dry  time she was draped.  A longitudinal incision was made in the palm, carried  down through subcutaneous tissue.  Bleeders were electrocauterized, palmar  fascia was split, superficial palmar arch identified, the flexor tendon to  the ring and little finger identified to the ulnar side of the median nerve.  Carpal retinaculum was incised with sharp dissection.  A right-angle and  Sewall retractor were placed between skin and forearm fascia.  The  fascia  was released for approximately a centimeter and half proximal to the wrist  crease under direct vision.  Canal was explored.  Tenosynovium tissue was  moderately thickened.  A very significant compression to the nerve with a  area of hyperemia was immediately apparent.  The wound was irrigated.  Skin  was closed interrupted 5-0 nylon sutures.  Sterile compressive dressing and  splint was applied.  The patient tolerated the procedure well and was taken  to the recovery observation in satisfactory condition.  She is discharged  home to return to the [facility] [place] in one week on Vicodin.          ______________________________  Cindee Salt, [name].    GK/MEDQ  D:  08/06/2006  T:  08/06/2006  Job:  [id number]   cc:   Dr. Toni Arthurs

## 2011-02-01 NOTE — Consult Note (Signed)
NAME:  Lynn [name], Lynn [name]                         ACCOUNT NO.:  [id number]   MEDICAL RECORD NO.:  [id number]                   PATIENT TYPE:  REC   LOCATION:  [facility]                                  FACILITY:  [facility]   PHYSICIAN:  Zachary George, DO                      DATE OF BIRTH:  20-Sep-1950   DATE OF CONSULTATION:  08/31/2002  DATE OF DISCHARGE:                                   CONSULTATION   HISTORY OF PRESENT ILLNESS:  Ms. Lynn [name] returns to clinic today as scheduled  for a lumbar epidural steroid injection for degenerative disk disease of the  lumbar spine with right lower extremity radicular symptoms. She was  initially seen by Dr. Stevphen Rochester on 08/18/02. Ms. Lynn [name] continues to complain of  pain radiating from her low back into bilateral buttocks and right posterior  thigh. She has undergone three lumbar epidural steroid injections in 2001 at  [facility] with significant improvement. Dr. Stevphen Rochester has prescribed Tramadol.  She is taking one pill twice a day without any significant improvement. She  wishes to proceed with lumbar epidural steroid injection. I reviewed the  health and history form and 14-point review of systems. Pain today is an  8/10 on a subjective scale. She denies bowel or bladder dysfunction.   PHYSICAL EXAMINATION:  Reveals a healthy female in no acute distress. Blood  pressure 107/58, pulse 88, respirations 16, O2 saturation 95% on room air.  Examination of the back reveals tenderness to palpation bilateral lumbar  paraspinals with level pelvis. No scoliosis. Range of motion is guarded.  Manual muscle testing is 5/5 bilateral lower extremities. Sensory  examination intact to light touch bilateral lower extremities. Muscle  stretch reflexes are symmetrical bilateral lower extremities. Straight leg  raise is equivocal on the right, negative on the left.   IMPRESSION:  Degenerative disk disease of the lumbar spine with right lower  extremity radicular symptoms in a S1  distribution.   PLAN:  1. Lumbar epidural steroid injection.  2. Continue Ultram. Instruct patient to increase to two pills up to four     times a day as needed for pain.  3. The patient to return to clinic in two weeks for reevaluation and     possible repeat injection as predicated by patient's response and     symptoms.   PROCEDURE:  Lumbar epidural steroid injection. The procedure was described  to patient in detail including risks, benefits, limitations, and  alternatives. Risks included bleeding, infection, spinal headache, nerve  injury, increased low back pain. The patient wishes to proceed. Informed  consent was obtained. The patient was brought back to the fluoroscopy suite  and placed on the table in prone position. Skin was prepped and draped in  usual sterile fashion. Skin and subcutaneous tissues were anesthetized with  3 cc of 1% preservative free lidocaine. Under direct fluoroscopic guidance,  an 18-gauge 3-1/2-inch Hustead needle was advanced into the right paramedian  L5-S1 epidural space with loss-of-resistance technique. No CSF, heme, or  paresthesias were noted. This was then followed by an injection of 1.5 cc of  Kenalog 40 mg/cc plus 2.5 cc of normal saline with needle flush. There were  no complications. The patient tolerated the procedure well. Discharge  instructions were given. The patient was monitored and released in stable  condition. Post injection pain level is 0/10.   The patient was educated about findings and recommendations and understands.  There were no barriers to communication.                                               Zachary George, DO    JW/MEDQ  D:  08/31/2002  T:  09/01/2002  Job:  [id number]   cc:   Reinaldo Meeker, [name].  [address]  [place]  [place] [address]  Fax: [phone]
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
## 2011-02-01 NOTE — Op Note (Signed)
[facility]  Patient:    Lynn, [name]                      MRN: [id number] Proc. Date: 05/24/00 Adm. Date:  [id number] Disc. Date: [id number] Attending:  Burnard Bunting                           Operative Report  This is a redictation.  PREOPERATIVE DIAGNOSES:  Left hip intertrochanteric fracture.  POSTOPERATIVE DIAGNOSES:  Left hip intertrochanteric hip fracture.  OPERATION PERFORMED:  Left hip intertrochanteric fracture open reduction internal fixation with 4 hole titanium 135 degree dynamic hip screw.  SURGEON:  Burnard Bunting, [name].  ANESTHESIA:  General endotracheal.  ESTIMATED BLOOD LOSS:  300 cc.  DRAINS:  Hemovac x 1.  INDICATIONS FOR PROCEDURE:  [name] is a 60 year old female who fell on her deck on May 03, 2000 and at that time she was unable to ambulate. Plane x-rays demonstrate intertrochanteric fracture.  DESCRIPTION OF PROCEDURE:  The patient was brought to the operating room where she was placed on the fracture table. General endotracheal anesthesia was administered. Preoperative IV antibiotics were administered. Reduction was performed with a slight amount of traction and internal rotation of the left leg. Reduction was confirmed in AP and lateral planes under fluoroscopy. The left hip region was then prepped with Betadine and Duraprep solution and draped in a sterile manner using a wall drape. This wall drape utilized Ioban to cover the operative field. A 10 cm incision was then made over the lateral aspect of the femur. The skin and subcutaneous tissue was sharply divided. The fascia lata was divided, the vastus lateralis was elevated and detachment of the femur using inverted J incision. Bennett retractors were placed. Starting point for the guide pin was placed just slightly inferior to the vastus lateralis ridge. A guide pin was placed through the center portion of the head and was checked in the AP and lateral  planes. A measurement was obtained. The femoral head and neck was then reamed over the guide pin to the appropriate depth. The bone was tapped and then a 4 hole dynamic hip screw system was placed. Titanium was utilized as the patient had a reported delayed allergic reaction to the stainless steel rod implanted into her femur in the past. The sliding hip screw was checked in the AP and lateral planes and was found to be in good position. The implant was then secured to the femur using 4 bicortical screws. The traction was released and a compression screw was applied and removed. Fracture reduction was checked in the AP and lateral planes and was found to be good. The incision was thoroughly irrigated and a Hemovac drain was placed beneath the fascia lata. The fascia over the vastus lateralis was closed using a running 4-0 Vicryl suture. The fascia lata was then closed using interrupted #0 Vicryl figure-of-eight sutures. The skin and subcutaneous tissue was closed in layers using 2-0 Vicryl suture and skin staples. A compressive dressing was applied around the thigh. The patient tolerated the procedure well without immediate complications and was transferred to the recovery room in stable condition. DD:  05/24/00 TD:  05/26/00 Job: [id number]

## 2011-02-01 NOTE — Consult Note (Signed)
NAME:  Lynn [name], Lynn [name]                         ACCOUNT NO.:  [id number]   MEDICAL RECORD NO.:  [id number]                   PATIENT TYPE:  REC   LOCATION:  [facility]                                  FACILITY:  [facility]   PHYSICIAN:  Zachary George, DO                      DATE OF BIRTH:  10-22-1950   DATE OF CONSULTATION:  09/28/2002  DATE OF DISCHARGE:                                   CONSULTATION   REASON FOR CONSULTATION:  Lynn [name] returns to the clinic today for re-  evaluation and possible repeat lumbar epidural steroid injection.  She has  undergone two epidural steroid injections for degenerative disk disease of  the lumbar spine with right lower extremity radicular symptoms.  She states  overall she is improved, but she continues to have low back pain with mild  radiating pain to her right posterolateral thigh which again has improved  following two lumbar epidural steroid injections.  She feels like she still  has room for improvement and we discussed the option of repeating the  injection and the patient wishes to repeat the epidural steroid injection.  Her pain today is a 6 over 10 on a subjective scale.  Following the second  epidural steroid injections, her pain dropped down to 4 over 10 and now it  is returning back towards baseline.  Her function and quality of life  indices have improved significantly.  She also asked me about anti-  inflammatory medication.  She has a history of ulcers, but she takes  ibuprofen at this point without any significant relief.  She also does not  notice any adverse effect from the ibuprofen.  I have reviewed health and  history form and 14 point review of systems.   PHYSICAL EXAMINATION:  GENERAL:  Reveals a healthy female in no acute  distress.  VITAL SIGNS:  Blood pressure 129/67, pulse 87, respirations 20, and O2  saturations 98% on room air.  MUSCULOSKELETAL:  Major muscle testing is 5/5 bilateral lower extremities  with the exception of  3/5 left ankle dorsiflexion.  Sensory examination is  intact to light touch, bilateral lower extremities.  Muscle stretch reflexes  are diminished bilateral left extremities, but symmetric.  Examination of  the back reveals level pelvis without scoliosis and tenderness to palpation  bilateral lumbar paraspinal muscles.   IMPRESSION:  Degenerative disk disease of the lumbar spine with right lower  extremity radicular symptoms, improved.   PLAN:  1. I discussed treatment options with Ms. Bowman which include continued     conservative measures versus repeat lumbar epidural steroid injection.     The patient has had significant relief with lumbar epidural steroid     injections.  However, her pain seems to be gradually returning towards     baseline.  I think it is reasonable to proceed with a third lumbar  epidural steroid injection.  The patient wishes to proceed in this     regard.  2. I will give the patient a trial of Celebrex 200 mg 1 p.o. daily, and we     will monitor for any adverse GI effects.  I instructed the patient to     discontinue the Celebrex if she notices any gastric irritation.  3. Continue Ultram as needed.  4. The patient will return to the clinic in one month for re-evaluation.   PROCEDURE:  Lumbar epidural steroid injection.   DESCRIPTION OF PROCEDURE:  Procedure, risk stratification in detail  including the risks, benefits, limitations, and alternatives.  The risks  include bleeding, infection, spinal headache, nerve injury, increased low  back pain, and allergic reaction to medication.  The patient wishes to  proceed.  Informed consent was obtained.  The patient was brought back to  the fluoroscopy suite and placed on the table in prone position.  The skin  was prepped and draped in the usual sterile fashion.  The skin and  subcutaneous tissues were anesthetized with 3 cubic centimeters of 1%  preservative-free lidocaine.  Under direct fluoroscopic  guidance, a #18-  gauge 3-1/2 Hustead needle was advanced into the right paramedian L5-S1  epidural space with loss of resistance technique.  No CSF, heme, or  paresthesias were noted.  This was then followed by an injection of 1.5  cubic centimeters of Kenalog, 40 mg/cubic centimeter plus 2.5 cubic  centimeters of normal saline with needle flush.  There were no  complications.  The patient tolerated the procedure well.  Discharge  instructions were given.  The patient was monitored and released in stable  condition.   The patient was educated about findings and recommendations and understands.  No barriers to communication.                                                  Zachary George, DO    JW/MEDQ  D:  09/28/2002  T:  09/28/2002  Job:  [id number]   cc:   Reinaldo Meeker, [name].  [address]  [place]  [place] [address]  Fax: [phone]

## 2011-02-01 NOTE — Op Note (Signed)
NAME:  Lynn [name], Lynn [name]                         ACCOUNT NO.:  [id number]   MEDICAL RECORD NO.:  [id number]                   PATIENT TYPE:  OBV   LOCATION:  0478                                 FACILITY:  [facility]   PHYSICIAN:  Maretta Bees. Vonita Moss, [name].             DATE OF BIRTH:  1951/07/16   DATE OF PROCEDURE:  01/02/2004  DATE OF DISCHARGE:                                 OPERATIVE REPORT   PREOPERATIVE DIAGNOSIS:  Stress urinary incontinence.   POSTOPERATIVE DIAGNOSIS:  Stress urinary incontinence.   PROCEDURES PERFORMED:  1. Transobturator sling procedure.  2. Cystoscopy.   SURGEON:  Maretta Bees. Vonita Moss, MD   ASSISTANT:  Thyra Breed, MD   ANESTHESIA:  General endotracheal.   DRAINS:  A 16 French Foley catheter to straight drainage.   ESTIMATED BLOOD LOSS:  30 mL.   COMPLICATIONS:  None.   INDICATIONS FOR PROCEDURE:  Ms. Figiel is a pleasant 60 year old female,  originally evaluated for a history of urinary tract infections as well as  hematuria.  Additionally, she was found to have significant stress  incontinence with coughing, sneezing, and emesis, especially with her GERD  and also at times with intercourse.  She denies any urge incontinence and  has some mild frequency of urination.  She currently wears pads.  Following  a complete hematuria evaluation including cystoscopy to evaluate her lower  tracts as well as a renal ultrasound to evaluate her upper urinary tract,  procedures to help with her stress incontinence were described.  After  hearing the risks, benefits, and alternatives of the transobturator OB tape  sling procedure, the patient understands and is willing to proceed.   PROCEDURE IN DETAIL:  Following identification by her arm bracelet, the  patient was brought to the operating room and placed in the supine position.  She then successful induction of general endotracheal anesthesia and  received preoperative IV antibiotics.  She was then moved to the  dorsal  lithotomy position.  Her perineum and genitalia were then prepped with  Betadine and draped in the usual sterile fashion.  We then placed a 16  French Foley catheter, and the bladder was drained in its entirety.  A  weighted vaginal speculum was then placed for easier visualization of the  anterior vaginal mucosa.  We first marked the location of our two incisions  to pass through the obturator canal.  These were approximately 5 cm from the  clitoris on both the right and the left just below the palpable adductor  longus muscle.  Once these marks were complete, we placed an Allis clamp on  the anterior vaginal mucosa just proximal to the urethral opening to elevate  the vaginal mucosa.  Marcaine 0.5% was then injected into the anterior  vaginal mucosa right along the midline and approximating mid urethra.  A  long-handled knife was then used to create an approximate 3 cm incision in  the midline  overlying the urethra.  This incision was further opened  laterally with Struhle scissors and forceps.  Care was taken to avoid the  urethra.  Surgeon's finger was then used to evaluate the openings created on  either side of the urethra.  The surgeon's finger was easy to pass on either  side of the urethra just below the pubic bone.  Therefore, we proceeded with  passage of the obturator C-shaped needle.  First on the patient's left, a  #11 blade was used to make two small incisions overlying the previously  aforementioned markings.  The C-shaped obturator needle was then placed  through the incision made on the patient's left.  The needle was passed in  such a fashion that it hugged to the inside of the pubic bone and was easily  palpable throughout its course, guided by the surgeon's finger in the  previous anterior vaginal wall incision.  The needle was then passed into  plain view.  One end of the obturator sling tape was then passed through  into the needle passer.  The C needle was  then brought back through the  previous incision, thereby delivering one end of the sling material.  We  then performed an identical procedure on the patient's right and again, the  C-shaped needle passer was used to deliver the opposite end of the tape  through the incision.  A right-angle clamp was then placed between the  urethra and the tape to ensure the proper tension was applied.  Once we were  satisfied that the sling placement was adequate, the Foley catheter balloon  was deflated, and rigid cystoscopy was performed with the 70-degreee  cystoscopic lens through a 22 French sheath.  This revealed both the right  and the left ureteral orifice in their normal anatomic location, effluxing  clear urine.  Further evaluation of the bladder, especially the anterior  lateral walls revealed no evidence of trauma, perforation, or foreign  bodies.  Furthermore, the entire bladder was evaluated to reveal no evidence  of foreign body stones, bladder tumors, or mucosal irregularities.  The  cystoscope was then removed, the Foley catheter reinserted in and the  bladder again drained.  With proper tension on the tape, we then performed  closure of the anterior vaginal mucosal defect.  Then 2-0 Vicryl was  utilized, and the incision was closed in a running fashion after copiously  irrigation with antibiotic solution to the incision.  Following this, the  anterior vaginal mucosa more distal to the incision was somewhat separated  from the Allis clamp.  Therefore, the 2-0 Vicryl suture was used for two  additional passes to close this mucosa in its entirety.  The excess sling  material exiting both incisions was then trimmed.  The vaginal vault was  again copiously irrigated and dried with a sponge, the weighted vaginal  speculum removed, and an Estrace-soaked vaginal packing was inserted into  the vagina.  The two obturator incisions were then washed and dried. Dermabond was used to close these  incisions.  The patient tolerated the  procedure well, and there were no complications.  Please note that Dr. Larey Dresser was present and participated in the entire procedure as he was the  responsible surgeon.   DISPOSITION:  After awaking from general anesthesia, the patient was  transported to the postanesthesia care unit in stable condition.  From here,  she was transferred to the floor for a 23-hour admission and further  postoperative observation.  Thyra Breed, MD                            Maretta Bees. Vonita Moss, [name].    EG/MEDQ  D:  01/02/2004  T:  01/03/2004  Job:  [id number]

## 2011-06-06 LAB — PROTIME-INR
INR: 0.9
Prothrombin Time: 12.5

## 2011-06-06 LAB — BASIC METABOLIC PANEL
BUN: 24 — ABNORMAL HIGH
CO2: 29
Calcium: 9.4
Creatinine, Ser: 0.92
Glucose, Bld: 128 — ABNORMAL HIGH

## 2011-06-06 LAB — CBC
Hemoglobin: 15.2 — ABNORMAL HIGH
MCHC: 34
RDW: 12.9

## 2011-08-27 ENCOUNTER — Other Ambulatory Visit: Payer: Self-pay | Admitting: Neurosurgery

## 2011-08-27 DIAGNOSIS — IMO0002 Reserved for concepts with insufficient information to code with codable children: Secondary | ICD-10-CM

## 2011-08-31 ENCOUNTER — Ambulatory Visit
Admission: RE | Admit: 2011-08-31 | Discharge: 2011-08-31 | Disposition: A | Discharge status: Home / Self Care | Payer: Medicare Other | Source: Ambulatory Visit | Attending: Neurosurgery | Admitting: Neurosurgery

## 2011-08-31 DIAGNOSIS — IMO0002 Reserved for concepts with insufficient information to code with codable children: Secondary | ICD-10-CM

## 2011-10-07 ENCOUNTER — Encounter (HOSPITAL_COMMUNITY): Payer: Self-pay | Admitting: Pharmacy Technician

## 2011-10-07 ENCOUNTER — Other Ambulatory Visit: Payer: Self-pay | Admitting: Neurosurgery

## 2011-10-07 DIAGNOSIS — M7120 Synovial cyst of popliteal space [Baker], unspecified knee: Secondary | ICD-10-CM | POA: Diagnosis not present

## 2011-10-07 DIAGNOSIS — M545 Low back pain: Secondary | ICD-10-CM | POA: Diagnosis not present

## 2011-10-07 DIAGNOSIS — M47817 Spondylosis without myelopathy or radiculopathy, lumbosacral region: Secondary | ICD-10-CM | POA: Diagnosis not present

## 2011-10-07 DIAGNOSIS — IMO0002 Reserved for concepts with insufficient information to code with codable children: Secondary | ICD-10-CM | POA: Diagnosis not present

## 2011-10-10 DIAGNOSIS — F4310 Post-traumatic stress disorder, unspecified: Secondary | ICD-10-CM | POA: Diagnosis not present

## 2011-10-10 DIAGNOSIS — F331 Major depressive disorder, recurrent, moderate: Secondary | ICD-10-CM | POA: Diagnosis not present

## 2011-10-11 ENCOUNTER — Ambulatory Visit (HOSPITAL_COMMUNITY)
Admission: RE | Admit: 2011-10-11 | Discharge: 2011-10-11 | Disposition: A | Discharge status: Home / Self Care | Payer: Medicare Other | Source: Ambulatory Visit | Attending: Anesthesiology | Admitting: Anesthesiology

## 2011-10-11 ENCOUNTER — Inpatient Hospital Stay (HOSPITAL_COMMUNITY)
Admission: RE | Admit: 2011-10-11 | Discharge: 2011-10-11 | Disposition: A | Discharge status: Home / Self Care | Payer: Medicare Other | Source: Ambulatory Visit | Attending: Neurosurgery | Admitting: Neurosurgery

## 2011-10-11 ENCOUNTER — Other Ambulatory Visit: Payer: Self-pay

## 2011-10-11 ENCOUNTER — Encounter (HOSPITAL_COMMUNITY): Payer: Self-pay

## 2011-10-11 DIAGNOSIS — M47817 Spondylosis without myelopathy or radiculopathy, lumbosacral region: Secondary | ICD-10-CM | POA: Diagnosis not present

## 2011-10-11 DIAGNOSIS — Z01811 Encounter for preprocedural respiratory examination: Secondary | ICD-10-CM | POA: Diagnosis not present

## 2011-10-11 DIAGNOSIS — G4730 Sleep apnea, unspecified: Secondary | ICD-10-CM | POA: Diagnosis not present

## 2011-10-11 DIAGNOSIS — M5126 Other intervertebral disc displacement, lumbar region: Secondary | ICD-10-CM | POA: Diagnosis not present

## 2011-10-11 DIAGNOSIS — M7130 Other bursal cyst, unspecified site: Secondary | ICD-10-CM | POA: Diagnosis not present

## 2011-10-11 HISTORY — DX: Major depressive disorder, single episode, unspecified: F32.9

## 2011-10-11 HISTORY — DX: Peripheral vascular disease, unspecified: I73.9

## 2011-10-11 HISTORY — DX: Depression, unspecified: F32.A

## 2011-10-11 HISTORY — DX: Personal history of other diseases of the digestive system: Z87.19

## 2011-10-11 HISTORY — DX: Unspecified osteoarthritis, unspecified site: M19.90

## 2011-10-11 HISTORY — DX: Reserved for inherently not codable concepts without codable children: IMO0001

## 2011-10-11 HISTORY — DX: Encounter for other specified aftercare: Z51.89

## 2011-10-11 LAB — BASIC METABOLIC PANEL
CO2: 30 mEq/L (ref 19–32)
Calcium: 9.4 mg/dL (ref 8.4–10.5)
Creatinine, Ser: 0.81 mg/dL (ref 0.50–1.10)
GFR calc non Af Amer: 79 mL/min — ABNORMAL LOW (ref >90)
Glucose, Bld: 110 mg/dL — ABNORMAL HIGH (ref 70–99)

## 2011-10-11 LAB — SURGICAL PCR SCREEN: Staphylococcus aureus: NEGATIVE

## 2011-10-11 LAB — CBC
MCH: 31.4 pg (ref 26.0–34.0)
MCV: 91.2 fL (ref 78.0–100.0)
Platelets: 237 10*3/uL (ref 150–400)
RDW: 13.3 % (ref 11.5–15.5)
WBC: 10 10*3/uL (ref 4.0–10.5)

## 2011-10-11 NOTE — Progress Notes (Signed)
Response back from [facility], pt. was seen- 2007, records in storage, it would take a couple of weeks to retrieve the records. Will not continue to pursue .

## 2011-10-11 NOTE — Progress Notes (Signed)
Call to [facility], requested records relative to cardiac surveillance done 2009 or 2007?

## 2011-10-11 NOTE — Pre-Procedure Instructions (Signed)
[name]  10/11/2011   Your procedure is scheduled on:  10/15/2011  Report to [facility] at 10:45 AM.  Call this number if you have problems the morning of surgery: [phone]   Remember:   Do not eat food:After Midnight.  May have clear liquids: up to 4 Hours before arrival.  Clear liquids include soda, tea, black coffee, apple or grape juice, broth.  Take these medicines the morning of surgery with A SIP OF WATER:  XANAX, NEXIUM, PAXIL   Do not wear jewelry, make-up or nail polish.  Do not wear lotions, powders, or perfumes. You may wear deodorant.  Do not shave 48 hours prior to surgery.  Do not bring valuables to the hospital.  Contacts, dentures or bridgework may not be worn into surgery.  Leave suitcase in the car. After surgery it may be brought to your room.  For patients admitted to the hospital, checkout time is 11:00 AM the day of discharge.   Patients discharged the day of surgery will not be allowed to drive home.  Name and phone number of your driver:  With mother & daughter   Special Instructions: CHG Shower Use Special Wash: 1/2 bottle night before surgery and 1/2 bottle morning of surgery.   Please read over the following fact sheets that you were given: Pain Booklet, Coughing and Deep Breathing, MRSA Information and Surgical Site Infection Prevention

## 2011-10-14 MED ORDER — CEFAZOLIN SODIUM-DEXTROSE 2-3 GM-% IV SOLR
2.0000 g | INTRAVENOUS | Status: DC
  Filled 2011-10-14: qty 50

## 2011-10-15 ENCOUNTER — Encounter (HOSPITAL_COMMUNITY)
Admission: RE | Disposition: A | Discharge status: Home / Self Care | Payer: Self-pay | Source: Ambulatory Visit | Attending: Neurosurgery

## 2011-10-15 ENCOUNTER — Encounter (HOSPITAL_COMMUNITY): Payer: Self-pay | Admitting: Anesthesiology

## 2011-10-15 ENCOUNTER — Inpatient Hospital Stay (HOSPITAL_COMMUNITY): Payer: Medicare Other

## 2011-10-15 ENCOUNTER — Inpatient Hospital Stay (HOSPITAL_COMMUNITY): Payer: Medicare Other | Admitting: Anesthesiology

## 2011-10-15 ENCOUNTER — Encounter (HOSPITAL_COMMUNITY): Payer: Self-pay | Admitting: *Deleted

## 2011-10-15 ENCOUNTER — Inpatient Hospital Stay (HOSPITAL_COMMUNITY)
Admission: RE | Admit: 2011-10-15 | Discharge: 2011-10-16 | DRG: 491 | Disposition: A | Discharge status: Home / Self Care | Payer: Medicare Other | Source: Ambulatory Visit | Attending: Neurosurgery | Admitting: Neurosurgery

## 2011-10-15 DIAGNOSIS — M7130 Other bursal cyst, unspecified site: Secondary | ICD-10-CM | POA: Diagnosis not present

## 2011-10-15 DIAGNOSIS — I10 Essential (primary) hypertension: Secondary | ICD-10-CM | POA: Diagnosis present

## 2011-10-15 DIAGNOSIS — M47817 Spondylosis without myelopathy or radiculopathy, lumbosacral region: Secondary | ICD-10-CM | POA: Diagnosis not present

## 2011-10-15 DIAGNOSIS — M5126 Other intervertebral disc displacement, lumbar region: Secondary | ICD-10-CM | POA: Diagnosis not present

## 2011-10-15 DIAGNOSIS — E559 Vitamin D deficiency, unspecified: Secondary | ICD-10-CM | POA: Diagnosis present

## 2011-10-15 DIAGNOSIS — G4730 Sleep apnea, unspecified: Secondary | ICD-10-CM | POA: Diagnosis not present

## 2011-10-15 DIAGNOSIS — F410 Panic disorder [episodic paroxysmal anxiety] without agoraphobia: Secondary | ICD-10-CM | POA: Diagnosis present

## 2011-10-15 DIAGNOSIS — Z87891 Personal history of nicotine dependence: Secondary | ICD-10-CM

## 2011-10-15 DIAGNOSIS — F4310 Post-traumatic stress disorder, unspecified: Secondary | ICD-10-CM | POA: Diagnosis present

## 2011-10-15 DIAGNOSIS — IMO0002 Reserved for concepts with insufficient information to code with codable children: Secondary | ICD-10-CM | POA: Diagnosis not present

## 2011-10-15 DIAGNOSIS — E785 Hyperlipidemia, unspecified: Secondary | ICD-10-CM | POA: Diagnosis present

## 2011-10-15 DIAGNOSIS — K449 Diaphragmatic hernia without obstruction or gangrene: Secondary | ICD-10-CM | POA: Diagnosis present

## 2011-10-15 DIAGNOSIS — K219 Gastro-esophageal reflux disease without esophagitis: Secondary | ICD-10-CM | POA: Diagnosis present

## 2011-10-15 HISTORY — PX: LUMBAR LAMINECTOMY/DECOMPRESSION MICRODISCECTOMY: SHX5026

## 2011-10-15 SURGERY — LUMBAR LAMINECTOMY/DECOMPRESSION MICRODISCECTOMY
Anesthesia: General | Site: Back | Laterality: Right | Wound class: Clean

## 2011-10-15 MED ORDER — NEOSTIGMINE METHYLSULFATE 1 MG/ML IJ SOLN
INTRAMUSCULAR | Status: DC | PRN
  Administered 2011-10-15: 3 mg via INTRAVENOUS

## 2011-10-15 MED ORDER — LIDOCAINE HCL (CARDIAC) 20 MG/ML IV SOLN
INTRAVENOUS | Status: DC | PRN
  Administered 2011-10-15: 100 mg via INTRAVENOUS

## 2011-10-15 MED ORDER — ACETAMINOPHEN 325 MG PO TABS: 650.0000 mg | ORAL_TABLET | ORAL | Status: DC | PRN

## 2011-10-15 MED ORDER — MORPHINE SULFATE 4 MG/ML IJ SOLN
1.0000 mg | INTRAMUSCULAR | Status: DC | PRN
  Administered 2011-10-16: 4 mg via INTRAVENOUS
  Filled 2011-10-15: qty 1

## 2011-10-15 MED ORDER — SIMVASTATIN 20 MG PO TABS
20.0000 mg | ORAL_TABLET | Freq: Every day | ORAL | Status: DC
  Filled 2011-10-15: qty 1

## 2011-10-15 MED ORDER — KCL IN DEXTROSE-NACL 20-5-0.45 MEQ/L-%-% IV SOLN
INTRAVENOUS | Status: DC
  Administered 2011-10-15: 17:00:00 via INTRAVENOUS
  Filled 2011-10-15 (×3): qty 1000

## 2011-10-15 MED ORDER — LIDOCAINE-EPINEPHRINE 1 %-1:100000 IJ SOLN
INTRAMUSCULAR | Status: DC | PRN
  Administered 2011-10-15: 20 mL

## 2011-10-15 MED ORDER — FENTANYL CITRATE 0.05 MG/ML IJ SOLN
INTRAMUSCULAR | Status: DC | PRN
  Administered 2011-10-15: 50 ug via INTRAVENOUS
  Administered 2011-10-15: 100 ug via INTRAVENOUS
  Administered 2011-10-15 (×3): 50 ug via INTRAVENOUS
  Administered 2011-10-15: 100 ug via INTRAVENOUS
  Administered 2011-10-15 (×2): 50 ug via INTRAVENOUS

## 2011-10-15 MED ORDER — ONDANSETRON HCL 4 MG/2ML IJ SOLN: 4.0000 mg | INTRAMUSCULAR | Status: DC | PRN

## 2011-10-15 MED ORDER — FENTANYL CITRATE 0.05 MG/ML IJ SOLN
INTRAMUSCULAR | Status: AC
  Filled 2011-10-15: qty 2

## 2011-10-15 MED ORDER — ACETAMINOPHEN 650 MG RE SUPP: 650.0000 mg | RECTAL | Status: DC | PRN

## 2011-10-15 MED ORDER — ALPRAZOLAM 0.5 MG PO TABS
1.0000 mg | ORAL_TABLET | Freq: Four times a day (QID) | ORAL | Status: DC
  Administered 2011-10-15: 1 mg via ORAL
  Filled 2011-10-15: qty 2

## 2011-10-15 MED ORDER — BUPIVACAINE HCL (PF) 0.5 % IJ SOLN
INTRAMUSCULAR | Status: DC | PRN
  Administered 2011-10-15: 30 mL

## 2011-10-15 MED ORDER — OXYCODONE-ACETAMINOPHEN 10-325 MG PO TABS: 1.0000 | ORAL_TABLET | ORAL | Status: DC | PRN

## 2011-10-15 MED ORDER — PAROXETINE HCL 20 MG PO TABS
40.0000 mg | ORAL_TABLET | ORAL | Status: DC
  Administered 2011-10-16: 40 mg via ORAL
  Filled 2011-10-15 (×2): qty 2

## 2011-10-15 MED ORDER — 0.9 % SODIUM CHLORIDE (POUR BTL) OPTIME
TOPICAL | Status: DC | PRN
  Administered 2011-10-15: 1000 mL

## 2011-10-15 MED ORDER — HYDROMORPHONE HCL PF 1 MG/ML IJ SOLN
INTRAMUSCULAR | Status: AC
  Filled 2011-10-15: qty 1

## 2011-10-15 MED ORDER — EPHEDRINE SULFATE 50 MG/ML IJ SOLN
INTRAMUSCULAR | Status: DC | PRN
  Administered 2011-10-15 (×4): 5 mg via INTRAVENOUS

## 2011-10-15 MED ORDER — SODIUM CHLORIDE 0.9 % IJ SOLN: 3.0000 mL | Freq: Two times a day (BID) | INTRAMUSCULAR | Status: DC

## 2011-10-15 MED ORDER — LACTATED RINGERS IV SOLN
INTRAVENOUS | Status: DC | PRN
  Administered 2011-10-15: 15:00:00 via INTRAVENOUS

## 2011-10-15 MED ORDER — METHYLPREDNISOLONE ACETATE PF 80 MG/ML IJ SUSP
INTRAMUSCULAR | Status: DC | PRN
  Administered 2011-10-15: 80 mg

## 2011-10-15 MED ORDER — BENAZEPRIL HCL 5 MG PO TABS
5.0000 mg | ORAL_TABLET | ORAL | Status: DC
  Administered 2011-10-16: 5 mg via ORAL
  Filled 2011-10-15 (×2): qty 1

## 2011-10-15 MED ORDER — MEPERIDINE HCL 25 MG/ML IJ SOLN: 6.2500 mg | INTRAMUSCULAR | Status: DC | PRN

## 2011-10-15 MED ORDER — SODIUM CHLORIDE 0.9 % IV SOLN
INTRAVENOUS | Status: AC
  Filled 2011-10-15: qty 500

## 2011-10-15 MED ORDER — ONDANSETRON HCL 4 MG/2ML IJ SOLN
INTRAMUSCULAR | Status: DC | PRN
  Administered 2011-10-15: 4 mg via INTRAVENOUS

## 2011-10-15 MED ORDER — PHENOL 1.4 % MT LIQD: 1.0000 | OROMUCOSAL | Status: DC | PRN

## 2011-10-15 MED ORDER — CEFAZOLIN SODIUM 1-5 GM-% IV SOLN
1.0000 g | Freq: Three times a day (TID) | INTRAVENOUS | Status: AC
  Administered 2011-10-15 – 2011-10-16 (×2): 1 g via INTRAVENOUS
  Filled 2011-10-15 (×2): qty 50

## 2011-10-15 MED ORDER — SODIUM CHLORIDE 0.9 % IR SOLN
Status: DC | PRN
  Administered 2011-10-15: 16:00:00

## 2011-10-15 MED ORDER — ONDANSETRON HCL 4 MG/2ML IJ SOLN: 4.0000 mg | Freq: Once | INTRAMUSCULAR | Status: DC | PRN

## 2011-10-15 MED ORDER — MORPHINE SULFATE 4 MG/ML IJ SOLN: 0.0500 mg/kg | INTRAMUSCULAR | Status: DC | PRN

## 2011-10-15 MED ORDER — GLYCOPYRROLATE 0.2 MG/ML IJ SOLN
INTRAMUSCULAR | Status: DC | PRN
  Administered 2011-10-15: .5 mg via INTRAVENOUS

## 2011-10-15 MED ORDER — OXYCODONE-ACETAMINOPHEN 5-325 MG PO TABS
1.0000 | ORAL_TABLET | ORAL | Status: DC | PRN
  Administered 2011-10-15 – 2011-10-16 (×2): 2 via ORAL
  Filled 2011-10-15 (×2): qty 2

## 2011-10-15 MED ORDER — CEFAZOLIN SODIUM 1-5 GM-% IV SOLN
INTRAVENOUS | Status: DC | PRN
  Administered 2011-10-15: 2 g via INTRAVENOUS

## 2011-10-15 MED ORDER — KCL IN DEXTROSE-NACL 20-5-0.45 MEQ/L-%-% IV SOLN
INTRAVENOUS | Status: AC
  Administered 2011-10-15: 1000 mL
  Filled 2011-10-15: qty 1000

## 2011-10-15 MED ORDER — FENTANYL CITRATE 0.05 MG/ML IJ SOLN
INTRAMUSCULAR | Status: DC | PRN
  Administered 2011-10-15: 100 ug via INTRAVENOUS

## 2011-10-15 MED ORDER — HEMOSTATIC AGENTS (NO CHARGE) OPTIME
TOPICAL | Status: DC | PRN
  Administered 2011-10-15: 1 via TOPICAL

## 2011-10-15 MED ORDER — HYDROMORPHONE HCL PF 1 MG/ML IJ SOLN
0.2500 mg | INTRAMUSCULAR | Status: DC | PRN
  Administered 2011-10-15 (×2): 0.5 mg via INTRAVENOUS

## 2011-10-15 MED ORDER — PROPOFOL 10 MG/ML IV EMUL
INTRAVENOUS | Status: DC | PRN
  Administered 2011-10-15: 200 mg via INTRAVENOUS

## 2011-10-15 MED ORDER — MENTHOL 3 MG MT LOZG: 1.0000 | LOZENGE | OROMUCOSAL | Status: DC | PRN

## 2011-10-15 MED ORDER — PANTOPRAZOLE SODIUM 40 MG PO TBEC: 40.0000 mg | DELAYED_RELEASE_TABLET | Freq: Every day | ORAL | Status: DC

## 2011-10-15 MED ORDER — MORPHINE SULFATE 2 MG/ML IJ SOLN: 0.0500 mg/kg | INTRAMUSCULAR | Status: DC | PRN

## 2011-10-15 MED ORDER — DOCUSATE SODIUM 100 MG PO CAPS
100.0000 mg | ORAL_CAPSULE | Freq: Two times a day (BID) | ORAL | Status: DC
  Administered 2011-10-15: 100 mg via ORAL
  Filled 2011-10-15: qty 1

## 2011-10-15 MED ORDER — ROCURONIUM BROMIDE 100 MG/10ML IV SOLN
INTRAVENOUS | Status: DC | PRN
  Administered 2011-10-15: 50 mg via INTRAVENOUS

## 2011-10-15 MED ORDER — BACITRACIN 50000 UNITS IM SOLR
INTRAMUSCULAR | Status: AC
  Filled 2011-10-15: qty 1

## 2011-10-15 MED ORDER — THROMBIN 5000 UNITS EX KIT
PACK | CUTANEOUS | Status: DC | PRN
  Administered 2011-10-15 (×2): 5000 [IU] via TOPICAL

## 2011-10-15 MED ORDER — SODIUM CHLORIDE 0.9 % IJ SOLN: 3.0000 mL | INTRAMUSCULAR | Status: DC | PRN

## 2011-10-15 SURGICAL SUPPLY — 59 items
ADH SKN CLS APL DERMABOND .7 (GAUZE/BANDAGES/DRESSINGS) ×1
APL SKNCLS STERI-STRIP NONHPOA (GAUZE/BANDAGES/DRESSINGS)
BAG DECANTER FOR FLEXI CONT (MISCELLANEOUS) ×2 IMPLANT
BENZOIN TINCTURE PRP APPL 2/3 (GAUZE/BANDAGES/DRESSINGS) IMPLANT
BIT DRILL NEURO 2X3.1 SFT TUCH (MISCELLANEOUS) ×1 IMPLANT
BLADE SURG ROTATE 9660 (MISCELLANEOUS) IMPLANT
BUR ROUND FLUTED 5 RND (BURR) ×2 IMPLANT
CANISTER SUCTION 2500CC (MISCELLANEOUS) ×2 IMPLANT
CLOTH BEACON ORANGE TIMEOUT ST (SAFETY) ×2 IMPLANT
CONT SPEC 4OZ CLIKSEAL STRL BL (MISCELLANEOUS) IMPLANT
DERMABOND ADVANCED (GAUZE/BANDAGES/DRESSINGS) ×1
DERMABOND ADVANCED .7 DNX12 (GAUZE/BANDAGES/DRESSINGS) ×1 IMPLANT
DRAPE LAPAROTOMY 100X72 PEDS (DRAPES) ×2 IMPLANT
DRAPE MICROSCOPE LEICA (MISCELLANEOUS) ×2 IMPLANT
DRAPE POUCH INSTRU U-SHP 10X18 (DRAPES) ×2 IMPLANT
DRESSING TELFA 8X3 (GAUZE/BANDAGES/DRESSINGS) IMPLANT
DRILL NEURO 2X3.1 SOFT TOUCH (MISCELLANEOUS) ×2
DURAPREP 26ML APPLICATOR (WOUND CARE) ×2 IMPLANT
ELECT BLADE 4.0 EZ CLEAN MEGAD (MISCELLANEOUS) ×2
ELECT REM PT RETURN 9FT ADLT (ELECTROSURGICAL) ×2
ELECTRODE BLDE 4.0 EZ CLN MEGD (MISCELLANEOUS) ×1 IMPLANT
ELECTRODE REM PT RTRN 9FT ADLT (ELECTROSURGICAL) ×1 IMPLANT
GAUZE SPONGE 4X4 16PLY XRAY LF (GAUZE/BANDAGES/DRESSINGS) IMPLANT
GLOVE BIO SURGEON STRL SZ 6.5 (GLOVE) ×4 IMPLANT
GLOVE BIO SURGEON STRL SZ8 (GLOVE) ×2 IMPLANT
GLOVE BIOGEL PI IND STRL 7.0 (GLOVE) ×2 IMPLANT
GLOVE BIOGEL PI IND STRL 8 (GLOVE) ×1 IMPLANT
GLOVE BIOGEL PI IND STRL 8.5 (GLOVE) ×1 IMPLANT
GLOVE BIOGEL PI INDICATOR 7.0 (GLOVE) ×2
GLOVE BIOGEL PI INDICATOR 8 (GLOVE) ×1
GLOVE BIOGEL PI INDICATOR 8.5 (GLOVE) ×1
GLOVE EXAM NITRILE LRG STRL (GLOVE) IMPLANT
GLOVE EXAM NITRILE MD LF STRL (GLOVE) IMPLANT
GLOVE EXAM NITRILE XL STR (GLOVE) IMPLANT
GLOVE EXAM NITRILE XS STR PU (GLOVE) IMPLANT
GLOVE OPTIFIT SS 6.5 STRL BRWN (GLOVE) ×4 IMPLANT
GOWN BRE IMP SLV AUR LG STRL (GOWN DISPOSABLE) ×4 IMPLANT
GOWN BRE IMP SLV AUR XL STRL (GOWN DISPOSABLE) ×2 IMPLANT
GOWN STRL REIN 2XL LVL4 (GOWN DISPOSABLE) ×2 IMPLANT
KIT BASIN OR (CUSTOM PROCEDURE TRAY) ×2 IMPLANT
KIT ROOM TURNOVER OR (KITS) ×2 IMPLANT
NEEDLE BLUNT 18X1 FOR OR ONLY (NEEDLE) ×2 IMPLANT
NEEDLE HYPO 25X1 1.5 SAFETY (NEEDLE) ×2 IMPLANT
NS IRRIG 1000ML POUR BTL (IV SOLUTION) ×2 IMPLANT
PACK LAMINECTOMY NEURO (CUSTOM PROCEDURE TRAY) ×2 IMPLANT
PAD ARMBOARD 7.5X6 YLW CONV (MISCELLANEOUS) ×6 IMPLANT
SPONGE GAUZE 4X4 12PLY (GAUZE/BANDAGES/DRESSINGS) IMPLANT
SPONGE SURGIFOAM ABS GEL SZ50 (HEMOSTASIS) ×2 IMPLANT
STAPLER SKIN PROX WIDE 3.9 (STAPLE) IMPLANT
STRIP CLOSURE SKIN 1/2X4 (GAUZE/BANDAGES/DRESSINGS) IMPLANT
SUT VIC AB 0 CT1 18XCR BRD8 (SUTURE) ×1 IMPLANT
SUT VIC AB 0 CT1 8-18 (SUTURE) ×2
SUT VIC AB 2-0 CT1 18 (SUTURE) ×2 IMPLANT
SUT VIC AB 3-0 SH 8-18 (SUTURE) ×2 IMPLANT
SYR 20CC LL (SYRINGE) ×2 IMPLANT
SYR 3ML LL SCALE MARK (SYRINGE) ×2 IMPLANT
TOWEL OR 17X24 6PK STRL BLUE (TOWEL DISPOSABLE) ×2 IMPLANT
TOWEL OR 17X26 10 PK STRL BLUE (TOWEL DISPOSABLE) ×2 IMPLANT
WATER STERILE IRR 1000ML POUR (IV SOLUTION) ×2 IMPLANT

## 2011-10-15 NOTE — Preoperative (Signed)
Beta Blockers   Reason not to administer Beta Blockers:Not Applicable 

## 2011-10-15 NOTE — Anesthesia Postprocedure Evaluation (Signed)
  Anesthesia Post-op Note  Patient: Lynn [name]  Procedure(s) Performed:  LUMBAR LAMINECTOMY/DECOMPRESSION MICRODISCECTOMY - RIGHT Lumbar Four-Five Laminectomy with resection of synovial cyst  Patient Location: PACU  Anesthesia Type: General  Level of Consciousness: awake and alert   Airway and Oxygen Therapy: Patient Spontanous Breathing and Patient connected to nasal cannula oxygen  Post-op Pain: mild  Post-op Assessment: Post-op Vital signs reviewed, Patient's Cardiovascular Status Stable, Respiratory Function Stable, Patent Airway, No signs of Nausea or vomiting and Pain level controlled  Post-op Vital Signs: Reviewed and stable  Complications: No apparent anesthesia complications

## 2011-10-15 NOTE — Anesthesia Procedure Notes (Signed)
Procedure Name: Intubation Date/Time: 10/15/2011 3:09 PM Performed by: Julianne Rice Z Pre-anesthesia Checklist: Patient identified, Timeout performed, Emergency Drugs available, Suction available and Patient being monitored Patient Re-evaluated:Patient Re-evaluated prior to inductionOxygen Delivery Method: Circle System Utilized Preoxygenation: Pre-oxygenation with 100% oxygen Intubation Type: IV induction Ventilation: Mask ventilation without difficulty Laryngoscope Size: Mac and 3 Grade View: Grade I Tube type: Oral Tube size: 7.5 mm Number of attempts: 1 Airway Equipment and Method: stylet Placement Confirmation: ETT inserted through vocal cords under direct vision,  breath sounds checked- equal and bilateral and positive ETCO2 Secured at: 22 cm Tube secured with: Tape Dental Injury: Teeth and Oropharynx as per pre-operative assessment
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
## 2011-10-15 NOTE — Op Note (Signed)
10/15/2011  4:51 PM  PATIENT:  Lynn [name]  61 y.o. female  PRE-OPERATIVE DIAGNOSIS:  synovial cyst, lumbar spondylosis, HNP, lumbar radiculopathy  POST-OPERATIVE DIAGNOSIS:  synovial cyst, lumbar spondylosis, HNP, lumbar radiculopathy  PROCEDURE:  Procedure(s): LUMBAR LAMINECTOMY/DECOMPRESSION MICRODISCECTOMY and resection of synovial cyst with microdissection  SURGEON:  Surgeon(s): Dorian Heckle, MD Karn Cassis, MD  PHYSICIAN ASSISTANT:   ASSISTANTS: none   ANESTHESIA:   general  EBL:  Total I/O In: 1700 [I.V.:1700] Out: 60 [Blood:60]  BLOOD ADMINISTERED:none  DRAINS: none   LOCAL MEDICATIONS USED:  LIDOCAINE 10CC  SPECIMEN:  No Specimen  DISPOSITION OF SPECIMEN:  N/A  COUNTS:  YES  TOURNIQUET:  * No tourniquets in log *  DICTATION: DICTATION: Patient has a  L4-5 disc rupture on the right with a synovial cyst and significant right leg weakness. It was elected to take her to surgery for right L4-5 microdiscectomy and resection of synovial cyst.  Procedure: Patient was brought to the operating room and following the smooth and uncomplicated induction of general endotracheal anesthesia he was placed in a prone position on the Wilson frame. Low back was prepped and draped in the usual sterile fashion with DuraPrep. Area of planned incision was infiltrated with local lidocaine. Incision was made in the midline and carried to the lumbodorsal fascia which was incised on the right side of midline. Subperiosteal dissection was performed exposing what was felt to be L45 level. Intraoperative x-ray demonstrated marker probes at L4-5. A hemi-semi-laminectomy of L4 was performed a high-speed drill and completed with Kerrison rongeurs and a generous foraminotomy was performed overlying the superior aspect of the L5 lamina. Ligamentum flavum was detached and removed in a piecemeal fashion and the L5 nerve root was decompressed laterally with removal of the superior aspect of  the facet and ligamentum causing nerve root compression. The microscope was brought into the field and the L5 nerve root was mobilized medially. This exposed soft disc material. Multiple fragments were removed and these extended into the interspace which appeared to be quite soft with a disrupted annulus overlying the interspace. As a result it was elected to further decompress the interspace and remove loose disc material and this was done with pituitary rongeurs. The redundant annulus was also removed with osteophyte tool. A synovial cyst was identified which was adherent to the dura and was compressing the takeoff of the L5 nerve root.  Using microdissection, the cyst was mobilized and removed with resultant significant decompression of the nerve root and thecal sac. At this point it was felt that all neural elements were well decompressed and there was no evidence of residual loose disc material within the interspace. Hemostasis was assured with bipolar electrocautery and the interspace was irrigated with Depo-Medrol and fentanyl. The lumbodorsal fascia was closed with 0 Vicryl sutures the subcutaneous tissues reapproximated 2-0 Vicryl inverted sutures and the skin edges were reapproximated with 3-0 Vicryl subcuticular stitch. The wound was dressed with Dermabond. Patient was extubated in the operating room and taken to recovery in stable and satisfactory condition having tolerated his operation well counts were correct at the end of the case.  PLAN OF CARE: Admit for overnight observation  PATIENT DISPOSITION:  PACU - hemodynamically stable.   Delay start of Pharmacological VTE agent (>24hrs) due to surgical blood loss or risk of bleeding:  YES

## 2011-10-15 NOTE — Interval H&P Note (Signed)
History and Physical Interval Note:  10/15/2011 7:32 AM  Lynn [name]  has presented today for surgery, with the diagnosis of synovial cyst lumbar spondylosis lumbar radiculopathy  The various methods of treatment have been discussed with the patient and family. After consideration of risks, benefits and other options for treatment, the patient has consented to  Procedure(s): LUMBAR LAMINECTOMY/DECOMPRESSION MICRODISCECTOMY as a surgical intervention .  The patients' history has been reviewed, patient examined, no change in status, stable for surgery.  I have reviewed the patients' chart and labs.  Questions were answered to the patient's satisfaction.     [name]  Date of Initial H&P: 10/07/2011  History reviewed, patient examined, no change in status, stable for surgery.

## 2011-10-15 NOTE — Anesthesia Preprocedure Evaluation (Addendum)
Anesthesia Evaluation  Patient identified by MRN, date of birth, ID band Patient awake    Reviewed: Allergy & Precautions, H&P , NPO status , Patient's Chart, lab work & pertinent test results  Airway Mallampati: I TM Distance: >3 FB Neck ROM: Full    Dental  (+) Teeth Intact and Dental Advisory Given   Pulmonary sleep apnea , former smoker clear to auscultation Pt. has raspy voice.  States she was a smoker but voice changed after anterior cerv. surg.        Cardiovascular hypertension, Pt. on medications Regular Normal    Neuro/Psych    GI/Hepatic hiatal hernia, GERD-  Medicated and Controlled,  Endo/Other    Renal/GU      Musculoskeletal   Abdominal (+) obese,   Peds  Hematology   Anesthesia Other Findings   Reproductive/Obstetrics                         Anesthesia Physical Anesthesia Plan  ASA: III  Anesthesia Plan: General   Post-op Pain Management:    Induction: Intravenous  Airway Management Planned: Oral ETT  Additional Equipment:   Intra-op Plan:   Post-operative Plan: Extubation in OR  Informed Consent: I have reviewed the patients History and Physical, chart, labs and discussed the procedure including the risks, benefits and alternatives for the proposed anesthesia with the patient or authorized representative who has indicated his/her understanding and acceptance.   Dental advisory given  Plan Discussed with: CRNA, Anesthesiologist and Surgeon  Anesthesia Plan Comments:         Anesthesia Quick Evaluation

## 2011-10-15 NOTE — Plan of Care (Signed)
Problem: Consults Goal: Diagnosis - Spinal Surgery Outcome: Completed/Met Date Met:  10/15/11 Lumbar Laminectomy (Complex)

## 2011-10-15 NOTE — Transfer of Care (Signed)
Immediate Anesthesia Transfer of Care Note  Patient: Lynn [name]  Procedure(s) Performed:  LUMBAR LAMINECTOMY/DECOMPRESSION MICRODISCECTOMY - RIGHT Lumbar Four-Five Laminectomy with resection of synovial cyst  Patient Location: PACU  Anesthesia Type: General  Level of Consciousness: awake, alert  and oriented  Airway & Oxygen Therapy: Patient Spontanous Breathing and Patient connected to nasal cannula oxygen  Post-op Assessment: Report given to PACU RN, Post -op Vital signs reviewed and stable and Patient moving all extremities X 4  Post vital signs: Reviewed and stable  Complications: No apparent anesthesia complications

## 2011-10-16 ENCOUNTER — Encounter (HOSPITAL_COMMUNITY): Payer: Self-pay | Admitting: Neurosurgery

## 2011-10-16 NOTE — Progress Notes (Signed)
Subjective: Patient reports doing well  Objective: Vital signs in last 24 hours: Temp:  [97 F (36.1 C)-98.4 F (36.9 C)] 97.3 F (36.3 C) (01/30 0400) Pulse Rate:  [70-102] 80  (01/30 0400) Resp:  [10-25] 16  (01/30 0400) BP: (103-149)/(56-92) 103/56 mmHg (01/30 0400) SpO2:  [69 %-100 %] 96 % (01/30 0400)  Intake/Output from previous day: 01/29 0701 - 01/30 0700 In: 1940 [P.O.:240; I.V.:1700] Out: 60 [Blood:60] Intake/Output this shift:    Physical Exam: Full strength, Dressing CDI  Lab Results: No results found for this basename: WBC:2,HGB:2,HCT:2,PLT:2 in the last 72 hours BMET No results found for this basename: NA:2,K:2,CL:2,CO2:2,GLUCOSE:2,BUN:2,CREATININE:2,CALCIUM:2 in the last 72 hours  Studies/Results: Dg Lumbar Spine 1 View  10/15/2011  *RADIOLOGY REPORT*  Clinical Data: L4-L5 laminectomy.  Resection of synovial cyst.  LUMBAR SPINE - 1 VIEW  Comparison: MRI 08/31/2011.  Findings: Using the preoperative MRI for labeling, intraoperative localization demonstrates probes posterior to the L5 vertebra. Soft tissue retractors are dorsal to the L4-L5 interspace.  The probe appears to be in the L4-L5 interspinous space.  IMPRESSION: L4-L5 localization as described above.  Original Report Authenticated By: Andreas Newport, [name].    Assessment/Plan: Doing well.  DC home    LOS: 1 day    Dorian Heckle, MD 10/16/2011, 7:21 AM

## 2011-10-16 NOTE — Progress Notes (Signed)
CARE MANAGEMENT NOTE 10/16/2011   No Home Health discharge needs identified

## 2011-10-16 NOTE — Discharge Summary (Signed)
Physician Discharge Summary  Patient ID: EMEREE MAHLER MRN: [id number] DOB/AGE: May 24, 1951 61 y.o.  Admit date: 10/15/2011 Discharge date: 10/16/2011  Admission Diagnoses:  Discharge Diagnoses:  Active Problems:  * No active hospital problems. *    Discharged Condition: good  Hospital Course: unremarkable recovery following discectomy and resection of synovial cyst L4/5 Right  Consults: None  Significant Diagnostic Studies: None  Treatments: surgery: discectomy and resection of synovial cyst L4/5 Right  Discharge Exam: Blood pressure 103/56, pulse 80, temperature 97.3 F (36.3 C), temperature source Oral, resp. rate 16, SpO2 96.00%. Neurologic: Alert and oriented X 3, normal strength and tone. Normal symmetric reflexes. Normal coordination and gait Incision/Wound:CDI  Disposition:    Medication List  As of 10/16/2011  8:13 AM   TAKE these medications         ALPRAZolam 1 MG tablet   Commonly known as: XANAX   Take 1 mg by mouth 4 (four) times daily.      aspirin 81 MG tablet   Take 81 mg by mouth daily.      benazepril 5 MG tablet   Commonly known as: LOTENSIN   Take 5 mg by mouth every morning.      calcium-vitamin D 500-200 MG-UNIT per tablet   Take 1 tablet by mouth 2 (two) times daily with a meal.      Cholecalciferol 1000 UNITS tablet   Take 2,000 Units by mouth daily.      esomeprazole 40 MG capsule   Commonly known as: NEXIUM   Take 40 mg by mouth daily before breakfast.      ibuprofen 200 MG tablet   Commonly known as: ADVIL,MOTRIN   Take 400 mg by mouth every 6 (six) hours as needed.      oxyCODONE-acetaminophen 10-325 MG per tablet   Commonly known as: PERCOCET   Take 1 tablet by mouth every 4 (four) hours as needed.      PARoxetine 40 MG tablet   Commonly known as: PAXIL   Take 40 mg by mouth every morning.      pravastatin 40 MG tablet   Commonly known as: PRAVACHOL   Take 40 mg by mouth daily at 2 PM daily at 2 PM.              Signed: Denessa Cavan D 10/16/2011, 8:13 AM

## 2011-12-11 DIAGNOSIS — F331 Major depressive disorder, recurrent, moderate: Secondary | ICD-10-CM | POA: Diagnosis not present

## 2011-12-26 DIAGNOSIS — N3945 Continuous leakage: Secondary | ICD-10-CM | POA: Diagnosis not present

## 2011-12-26 DIAGNOSIS — R350 Frequency of micturition: Secondary | ICD-10-CM | POA: Diagnosis not present

## 2011-12-26 DIAGNOSIS — N3941 Urge incontinence: Secondary | ICD-10-CM | POA: Diagnosis not present

## 2012-01-24 DIAGNOSIS — I10 Essential (primary) hypertension: Secondary | ICD-10-CM | POA: Diagnosis not present

## 2012-01-24 DIAGNOSIS — E559 Vitamin D deficiency, unspecified: Secondary | ICD-10-CM | POA: Diagnosis not present

## 2012-01-24 DIAGNOSIS — E785 Hyperlipidemia, unspecified: Secondary | ICD-10-CM | POA: Diagnosis not present

## 2012-03-31 DIAGNOSIS — F331 Major depressive disorder, recurrent, moderate: Secondary | ICD-10-CM | POA: Diagnosis not present

## 2012-04-08 DIAGNOSIS — Z1231 Encounter for screening mammogram for malignant neoplasm of breast: Secondary | ICD-10-CM | POA: Diagnosis not present

## 2012-06-26 ENCOUNTER — Other Ambulatory Visit: Payer: Self-pay | Admitting: Physician Assistant

## 2012-06-26 ENCOUNTER — Ambulatory Visit
Admission: RE | Admit: 2012-06-26 | Discharge: 2012-06-26 | Disposition: A | Discharge status: Home / Self Care | Payer: Medicare Other | Source: Ambulatory Visit | Attending: Physician Assistant | Admitting: Physician Assistant

## 2012-06-26 DIAGNOSIS — R51 Headache: Secondary | ICD-10-CM | POA: Diagnosis not present

## 2012-06-26 DIAGNOSIS — R55 Syncope and collapse: Secondary | ICD-10-CM

## 2012-06-26 DIAGNOSIS — E039 Hypothyroidism, unspecified: Secondary | ICD-10-CM | POA: Diagnosis not present

## 2012-06-26 DIAGNOSIS — H538 Other visual disturbances: Secondary | ICD-10-CM | POA: Diagnosis not present

## 2012-06-26 MED ORDER — IOHEXOL 300 MG/ML  SOLN: 75.0000 mL | Freq: Once | INTRAMUSCULAR | Status: AC | PRN

## 2012-07-08 DIAGNOSIS — R55 Syncope and collapse: Secondary | ICD-10-CM | POA: Diagnosis not present

## 2012-07-14 ENCOUNTER — Other Ambulatory Visit: Payer: Self-pay | Admitting: Diagnostic Neuroimaging

## 2012-07-14 DIAGNOSIS — R55 Syncope and collapse: Secondary | ICD-10-CM

## 2012-07-15 DIAGNOSIS — R55 Syncope and collapse: Secondary | ICD-10-CM | POA: Diagnosis not present

## 2012-07-17 ENCOUNTER — Ambulatory Visit
Admission: RE | Admit: 2012-07-17 | Discharge: 2012-07-17 | Disposition: A | Discharge status: Home / Self Care | Payer: Medicare Other | Source: Ambulatory Visit | Attending: Diagnostic Neuroimaging | Admitting: Diagnostic Neuroimaging

## 2012-07-17 DIAGNOSIS — R55 Syncope and collapse: Secondary | ICD-10-CM | POA: Diagnosis not present

## 2012-07-17 MED ORDER — GADOBENATE DIMEGLUMINE 529 MG/ML IV SOLN
18.0000 mL | Freq: Once | INTRAVENOUS | Status: AC | PRN
  Administered 2012-07-17: 18 mL via INTRAVENOUS

## 2012-10-05 DIAGNOSIS — M542 Cervicalgia: Secondary | ICD-10-CM | POA: Diagnosis not present

## 2012-10-05 DIAGNOSIS — M545 Low back pain: Secondary | ICD-10-CM | POA: Diagnosis not present

## 2012-10-06 ENCOUNTER — Other Ambulatory Visit: Payer: Self-pay | Admitting: Neurosurgery

## 2012-10-06 ENCOUNTER — Other Ambulatory Visit (HOSPITAL_COMMUNITY): Payer: Self-pay | Admitting: Neurosurgery

## 2012-10-06 DIAGNOSIS — M545 Low back pain: Secondary | ICD-10-CM

## 2012-10-06 DIAGNOSIS — M546 Pain in thoracic spine: Secondary | ICD-10-CM

## 2012-10-06 DIAGNOSIS — M542 Cervicalgia: Secondary | ICD-10-CM

## 2012-10-07 ENCOUNTER — Encounter (HOSPITAL_COMMUNITY): Payer: Self-pay | Admitting: Pharmacy Technician

## 2012-10-08 ENCOUNTER — Ambulatory Visit (HOSPITAL_COMMUNITY)
Admission: RE | Admit: 2012-10-08 | Discharge: 2012-10-08 | Payer: Medicare Other | Source: Ambulatory Visit | Attending: Neurosurgery | Admitting: Neurosurgery

## 2012-10-08 ENCOUNTER — Ambulatory Visit (HOSPITAL_COMMUNITY): Payer: Medicare Other

## 2012-10-08 ENCOUNTER — Ambulatory Visit (HOSPITAL_COMMUNITY)
Admission: RE | Admit: 2012-10-08 | Discharge: 2012-10-08 | Disposition: A | Discharge status: Home / Self Care | Payer: Medicare Other | Source: Ambulatory Visit | Attending: Neurosurgery | Admitting: Neurosurgery

## 2012-10-08 ENCOUNTER — Other Ambulatory Visit (HOSPITAL_COMMUNITY): Payer: Medicare Other

## 2012-10-08 ENCOUNTER — Other Ambulatory Visit (HOSPITAL_COMMUNITY): Payer: Self-pay | Admitting: Neurosurgery

## 2012-10-08 DIAGNOSIS — M545 Low back pain, unspecified: Secondary | ICD-10-CM | POA: Insufficient documentation

## 2012-10-08 DIAGNOSIS — M4802 Spinal stenosis, cervical region: Secondary | ICD-10-CM | POA: Diagnosis not present

## 2012-10-08 DIAGNOSIS — I700 Atherosclerosis of aorta: Secondary | ICD-10-CM | POA: Insufficient documentation

## 2012-10-08 DIAGNOSIS — M542 Cervicalgia: Secondary | ICD-10-CM

## 2012-10-08 DIAGNOSIS — M79609 Pain in unspecified limb: Secondary | ICD-10-CM | POA: Insufficient documentation

## 2012-10-08 DIAGNOSIS — M546 Pain in thoracic spine: Secondary | ICD-10-CM

## 2012-10-08 DIAGNOSIS — K449 Diaphragmatic hernia without obstruction or gangrene: Secondary | ICD-10-CM | POA: Insufficient documentation

## 2012-10-08 DIAGNOSIS — M48061 Spinal stenosis, lumbar region without neurogenic claudication: Secondary | ICD-10-CM | POA: Diagnosis not present

## 2012-10-08 DIAGNOSIS — M47817 Spondylosis without myelopathy or radiculopathy, lumbosacral region: Secondary | ICD-10-CM | POA: Insufficient documentation

## 2012-10-08 DIAGNOSIS — IMO0002 Reserved for concepts with insufficient information to code with codable children: Secondary | ICD-10-CM | POA: Diagnosis not present

## 2012-10-08 DIAGNOSIS — M5380 Other specified dorsopathies, site unspecified: Secondary | ICD-10-CM | POA: Diagnosis not present

## 2012-10-08 DIAGNOSIS — I2510 Atherosclerotic heart disease of native coronary artery without angina pectoris: Secondary | ICD-10-CM | POA: Diagnosis not present

## 2012-10-08 DIAGNOSIS — Z981 Arthrodesis status: Secondary | ICD-10-CM | POA: Diagnosis not present

## 2012-10-08 DIAGNOSIS — M549 Dorsalgia, unspecified: Secondary | ICD-10-CM | POA: Diagnosis not present

## 2012-10-08 MED ORDER — OXYCODONE-ACETAMINOPHEN 5-325 MG PO TABS
ORAL_TABLET | ORAL | Status: AC
  Filled 2012-10-08: qty 1

## 2012-10-08 MED ORDER — OXYCODONE-ACETAMINOPHEN 5-325 MG PO TABS
1.0000 | ORAL_TABLET | ORAL | Status: DC | PRN
  Administered 2012-10-08: 1 via ORAL

## 2012-10-08 MED ORDER — IOHEXOL 300 MG/ML  SOLN
10.0000 mL | Freq: Once | INTRAMUSCULAR | Status: AC | PRN
  Administered 2012-10-08: 10 mL via INTRATHECAL

## 2012-10-08 MED ORDER — ONDANSETRON HCL 4 MG/2ML IJ SOLN: 4.0000 mg | Freq: Four times a day (QID) | INTRAMUSCULAR | Status: DC | PRN

## 2012-10-08 MED ORDER — DIAZEPAM 5 MG PO TABS
ORAL_TABLET | ORAL | Status: AC
  Filled 2012-10-08: qty 2

## 2012-10-08 MED ORDER — DIAZEPAM 5 MG PO TABS
10.0000 mg | ORAL_TABLET | Freq: Once | ORAL | Status: AC
  Administered 2012-10-08: 10 mg via ORAL

## 2012-10-08 NOTE — Procedures (Signed)
Omnipaque 300 6 cc L23

## 2012-10-12 DIAGNOSIS — M4310 Spondylolisthesis, site unspecified: Secondary | ICD-10-CM | POA: Diagnosis not present

## 2012-10-12 DIAGNOSIS — IMO0002 Reserved for concepts with insufficient information to code with codable children: Secondary | ICD-10-CM | POA: Diagnosis not present

## 2012-10-12 DIAGNOSIS — M4120 Other idiopathic scoliosis, site unspecified: Secondary | ICD-10-CM | POA: Diagnosis not present

## 2012-10-12 DIAGNOSIS — M48061 Spinal stenosis, lumbar region without neurogenic claudication: Secondary | ICD-10-CM | POA: Diagnosis not present

## 2012-10-15 ENCOUNTER — Other Ambulatory Visit: Payer: Self-pay | Admitting: Neurosurgery

## 2012-10-22 ENCOUNTER — Encounter (HOSPITAL_COMMUNITY): Payer: Self-pay | Admitting: Pharmacist

## 2012-10-29 ENCOUNTER — Inpatient Hospital Stay (HOSPITAL_COMMUNITY): Admission: RE | Admit: 2012-10-29 | Payer: Medicare Other | Source: Ambulatory Visit

## 2012-11-04 ENCOUNTER — Encounter (HOSPITAL_COMMUNITY)
Admission: RE | Admit: 2012-11-04 | Discharge: 2012-11-04 | Disposition: A | Discharge status: Home / Self Care | Payer: Medicare Other | Source: Ambulatory Visit | Attending: Neurosurgery | Admitting: Neurosurgery

## 2012-11-04 ENCOUNTER — Encounter (HOSPITAL_COMMUNITY): Payer: Self-pay

## 2012-11-04 DIAGNOSIS — M47817 Spondylosis without myelopathy or radiculopathy, lumbosacral region: Secondary | ICD-10-CM | POA: Diagnosis not present

## 2012-11-04 DIAGNOSIS — I10 Essential (primary) hypertension: Secondary | ICD-10-CM | POA: Diagnosis present

## 2012-11-04 DIAGNOSIS — M4120 Other idiopathic scoliosis, site unspecified: Secondary | ICD-10-CM | POA: Diagnosis not present

## 2012-11-04 DIAGNOSIS — E669 Obesity, unspecified: Secondary | ICD-10-CM | POA: Diagnosis present

## 2012-11-04 DIAGNOSIS — J984 Other disorders of lung: Secondary | ICD-10-CM | POA: Diagnosis not present

## 2012-11-04 DIAGNOSIS — Z23 Encounter for immunization: Secondary | ICD-10-CM | POA: Diagnosis not present

## 2012-11-04 DIAGNOSIS — M48061 Spinal stenosis, lumbar region without neurogenic claudication: Secondary | ICD-10-CM | POA: Diagnosis not present

## 2012-11-04 DIAGNOSIS — F329 Major depressive disorder, single episode, unspecified: Secondary | ICD-10-CM | POA: Diagnosis present

## 2012-11-04 DIAGNOSIS — Z01812 Encounter for preprocedural laboratory examination: Secondary | ICD-10-CM | POA: Diagnosis not present

## 2012-11-04 DIAGNOSIS — Z01818 Encounter for other preprocedural examination: Secondary | ICD-10-CM | POA: Diagnosis not present

## 2012-11-04 DIAGNOSIS — Q762 Congenital spondylolisthesis: Secondary | ICD-10-CM | POA: Diagnosis not present

## 2012-11-04 DIAGNOSIS — IMO0002 Reserved for concepts with insufficient information to code with codable children: Secondary | ICD-10-CM | POA: Diagnosis not present

## 2012-11-04 DIAGNOSIS — M4180 Other forms of scoliosis, site unspecified: Secondary | ICD-10-CM | POA: Diagnosis not present

## 2012-11-04 DIAGNOSIS — F411 Generalized anxiety disorder: Secondary | ICD-10-CM | POA: Diagnosis present

## 2012-11-04 DIAGNOSIS — M545 Low back pain, unspecified: Secondary | ICD-10-CM | POA: Diagnosis not present

## 2012-11-04 DIAGNOSIS — Z6841 Body Mass Index (BMI) 40.0 and over, adult: Secondary | ICD-10-CM | POA: Diagnosis not present

## 2012-11-04 DIAGNOSIS — M4310 Spondylolisthesis, site unspecified: Secondary | ICD-10-CM | POA: Diagnosis not present

## 2012-11-04 LAB — BASIC METABOLIC PANEL
BUN: 21 mg/dL (ref 6–23)
CO2: 28 mEq/L (ref 19–32)
Chloride: 106 mEq/L (ref 96–112)
Creatinine, Ser: 0.66 mg/dL (ref 0.50–1.10)
Glucose, Bld: 107 mg/dL — ABNORMAL HIGH (ref 70–99)
Potassium: 4 mEq/L (ref 3.5–5.1)

## 2012-11-04 LAB — TYPE AND SCREEN: Antibody Screen: NEGATIVE

## 2012-11-04 LAB — SURGICAL PCR SCREEN: MRSA, PCR: NEGATIVE

## 2012-11-04 LAB — CBC
HCT: 47 % — ABNORMAL HIGH (ref 36.0–46.0)
Hemoglobin: 15.8 g/dL — ABNORMAL HIGH (ref 12.0–15.0)
MCH: 30.9 pg (ref 26.0–34.0)
MCHC: 33.6 g/dL (ref 30.0–36.0)
MCV: 91.8 fL (ref 78.0–100.0)
RDW: 13.2 % (ref 11.5–15.5)

## 2012-11-04 MED ORDER — CEFAZOLIN SODIUM-DEXTROSE 2-3 GM-% IV SOLR
2.0000 g | INTRAVENOUS | Status: AC
  Administered 2012-11-05 (×2): 2 g via INTRAVENOUS
  Filled 2012-11-04: qty 50

## 2012-11-04 NOTE — Pre-Procedure Instructions (Signed)
Lynn [name]  11/04/2012   Your procedure is scheduled on:  Thursday November 05, 2012.  Report to [facility] at 5:30 AM.  Call this number if you have problems the morning of surgery: [phone]   Remember:   Do not eat food or drink liquids after midnight.   Take these medicines the morning of surgery with A SIP OF WATER: Esomeprazole (Nexium)    Do not wear jewelry, make-up or nail polish.  Do not wear lotions, powders, or perfumes.  Do not shave 48 hours prior to surgery.   Do not bring valuables to the hospital.  Contacts, dentures or bridgework may not be worn into surgery.  Leave suitcase in the car. After surgery it may be brought to your room.  For patients admitted to the hospital, checkout time is 11:00 AM the day of  discharge.   Patients discharged the day of surgery will not be allowed to drive  home.  Name and phone number of your driver: Family/Friend  Special Instructions: Shower using CHG 2 nights before surgery and the night before surgery.  If you shower the day of surgery use CHG.  Use special wash - you have one bottle of CHG for all showers.  You should use approximately 1/3 of the bottle for each shower.   Please read over the following fact sheets that you were given: Pain Booklet, Coughing and Deep Breathing, Blood Transfusion Information, MRSA Information and Surgical Site Infection Prevention
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
## 2012-11-04 NOTE — Progress Notes (Signed)
11/04/12 0833  OBSTRUCTIVE SLEEP APNEA  Score 4 or greater  Results sent to PCP

## 2012-11-05 ENCOUNTER — Encounter (HOSPITAL_COMMUNITY): Payer: Self-pay | Admitting: Anesthesiology

## 2012-11-05 ENCOUNTER — Inpatient Hospital Stay (HOSPITAL_COMMUNITY)
Admission: RE | Admit: 2012-11-05 | Discharge: 2012-11-07 | DRG: 457 | Disposition: A | Discharge status: Home Health | Payer: Medicare Other | Source: Ambulatory Visit | Attending: Neurosurgery | Admitting: Neurosurgery

## 2012-11-05 ENCOUNTER — Encounter (HOSPITAL_COMMUNITY): Payer: Self-pay | Admitting: *Deleted

## 2012-11-05 ENCOUNTER — Inpatient Hospital Stay (HOSPITAL_COMMUNITY): Payer: Medicare Other

## 2012-11-05 ENCOUNTER — Encounter (HOSPITAL_COMMUNITY)
Admission: RE | Disposition: A | Discharge status: Home Health | Payer: Self-pay | Source: Ambulatory Visit | Attending: Neurosurgery

## 2012-11-05 ENCOUNTER — Inpatient Hospital Stay (HOSPITAL_COMMUNITY): Payer: Medicare Other | Admitting: Anesthesiology

## 2012-11-05 DIAGNOSIS — Z01818 Encounter for other preprocedural examination: Secondary | ICD-10-CM

## 2012-11-05 DIAGNOSIS — M545 Low back pain: Secondary | ICD-10-CM | POA: Diagnosis not present

## 2012-11-05 DIAGNOSIS — Q762 Congenital spondylolisthesis: Secondary | ICD-10-CM

## 2012-11-05 DIAGNOSIS — Z6841 Body Mass Index (BMI) 40.0 and over, adult: Secondary | ICD-10-CM

## 2012-11-05 DIAGNOSIS — M47817 Spondylosis without myelopathy or radiculopathy, lumbosacral region: Secondary | ICD-10-CM | POA: Diagnosis present

## 2012-11-05 DIAGNOSIS — E669 Obesity, unspecified: Secondary | ICD-10-CM | POA: Diagnosis present

## 2012-11-05 DIAGNOSIS — I10 Essential (primary) hypertension: Secondary | ICD-10-CM | POA: Diagnosis present

## 2012-11-05 DIAGNOSIS — Z01812 Encounter for preprocedural laboratory examination: Secondary | ICD-10-CM

## 2012-11-05 DIAGNOSIS — F329 Major depressive disorder, single episode, unspecified: Secondary | ICD-10-CM | POA: Diagnosis present

## 2012-11-05 DIAGNOSIS — M4180 Other forms of scoliosis, site unspecified: Principal | ICD-10-CM | POA: Diagnosis present

## 2012-11-05 DIAGNOSIS — Z23 Encounter for immunization: Secondary | ICD-10-CM

## 2012-11-05 DIAGNOSIS — F411 Generalized anxiety disorder: Secondary | ICD-10-CM | POA: Diagnosis present

## 2012-11-05 DIAGNOSIS — F3289 Other specified depressive episodes: Secondary | ICD-10-CM | POA: Diagnosis present

## 2012-11-05 HISTORY — PX: ANTERIOR LAT LUMBAR FUSION: SHX1168

## 2012-11-05 HISTORY — PX: LUMBAR PERCUTANEOUS PEDICLE SCREW 3 LEVEL: SHX5562

## 2012-11-05 SURGERY — ANTERIOR LATERAL LUMBAR FUSION 3 LEVELS
Anesthesia: General | Site: Back | Laterality: Right | Wound class: Clean

## 2012-11-05 MED ORDER — LIDOCAINE HCL (CARDIAC) 20 MG/ML IV SOLN
INTRAVENOUS | Status: DC | PRN
  Administered 2012-11-05: 80 mg via INTRAVENOUS

## 2012-11-05 MED ORDER — SODIUM CHLORIDE 0.9 % IR SOLN
Status: DC | PRN
  Administered 2012-11-05: 09:00:00

## 2012-11-05 MED ORDER — OXYCODONE-ACETAMINOPHEN 5-325 MG PO TABS: 1.0000 | ORAL_TABLET | ORAL | Status: DC | PRN

## 2012-11-05 MED ORDER — DIPHENHYDRAMINE HCL 50 MG/ML IJ SOLN: 12.5000 mg | Freq: Four times a day (QID) | INTRAMUSCULAR | Status: DC | PRN

## 2012-11-05 MED ORDER — PANTOPRAZOLE SODIUM 40 MG IV SOLR
40.0000 mg | Freq: Every day | INTRAVENOUS | Status: DC
  Administered 2012-11-05 – 2012-11-06 (×2): 40 mg via INTRAVENOUS
  Filled 2012-11-05 (×3): qty 40

## 2012-11-05 MED ORDER — 0.9 % SODIUM CHLORIDE (POUR BTL) OPTIME
TOPICAL | Status: DC | PRN
  Administered 2012-11-05: 1000 mL

## 2012-11-05 MED ORDER — SENNA 8.6 MG PO TABS
1.0000 | ORAL_TABLET | Freq: Two times a day (BID) | ORAL | Status: DC
  Administered 2012-11-05 – 2012-11-07 (×4): 8.6 mg via ORAL
  Filled 2012-11-05 (×5): qty 1

## 2012-11-05 MED ORDER — DOCUSATE SODIUM 100 MG PO CAPS
100.0000 mg | ORAL_CAPSULE | Freq: Two times a day (BID) | ORAL | Status: DC
  Administered 2012-11-05 – 2012-11-07 (×4): 100 mg via ORAL
  Filled 2012-11-05 (×4): qty 1

## 2012-11-05 MED ORDER — SENNOSIDES-DOCUSATE SODIUM 8.6-50 MG PO TABS
1.0000 | ORAL_TABLET | Freq: Every evening | ORAL | Status: DC | PRN
  Filled 2012-11-05: qty 1

## 2012-11-05 MED ORDER — MORPHINE SULFATE (PF) 1 MG/ML IV SOLN
INTRAVENOUS | Status: DC
  Administered 2012-11-05: 17:00:00 via INTRAVENOUS
  Administered 2012-11-05: 1.5 mg via INTRAVENOUS
  Administered 2012-11-05: 14:00:00 via INTRAVENOUS
  Administered 2012-11-05: 19.5 mg via INTRAVENOUS
  Administered 2012-11-05: 7.5 mg via INTRAVENOUS
  Administered 2012-11-05: 6 mg via INTRAVENOUS
  Administered 2012-11-06: 5 mg via INTRAVENOUS
  Administered 2012-11-06: 4.5 mg via INTRAVENOUS
  Administered 2012-11-06: 1.5 mg via INTRAVENOUS
  Filled 2012-11-05: qty 25

## 2012-11-05 MED ORDER — FENTANYL CITRATE 0.05 MG/ML IJ SOLN: 50.0000 ug | Freq: Once | INTRAMUSCULAR | Status: DC

## 2012-11-05 MED ORDER — PHENOL 1.4 % MT LIQD: 1.0000 | OROMUCOSAL | Status: DC | PRN

## 2012-11-05 MED ORDER — HYDROMORPHONE HCL PF 1 MG/ML IJ SOLN
0.2500 mg | INTRAMUSCULAR | Status: DC | PRN
  Administered 2012-11-05 (×4): 0.5 mg via INTRAVENOUS

## 2012-11-05 MED ORDER — FLEET ENEMA 7-19 GM/118ML RE ENEM: 1.0000 | ENEMA | Freq: Once | RECTAL | Status: AC | PRN

## 2012-11-05 MED ORDER — PANTOPRAZOLE SODIUM 40 MG PO TBEC
40.0000 mg | DELAYED_RELEASE_TABLET | Freq: Every day | ORAL | Status: DC
  Administered 2012-11-05 – 2012-11-07 (×3): 40 mg via ORAL
  Filled 2012-11-05 (×3): qty 1

## 2012-11-05 MED ORDER — GLYCOPYRROLATE 0.2 MG/ML IJ SOLN
INTRAMUSCULAR | Status: DC | PRN
  Administered 2012-11-05: .6 mg via INTRAVENOUS

## 2012-11-05 MED ORDER — ZOLPIDEM TARTRATE 5 MG PO TABS: 5.0000 mg | ORAL_TABLET | Freq: Every evening | ORAL | Status: DC | PRN

## 2012-11-05 MED ORDER — ROCURONIUM BROMIDE 100 MG/10ML IV SOLN
INTRAVENOUS | Status: DC | PRN
  Administered 2012-11-05 (×2): 20 mg via INTRAVENOUS

## 2012-11-05 MED ORDER — NALOXONE HCL 0.4 MG/ML IJ SOLN: 0.4000 mg | INTRAMUSCULAR | Status: DC | PRN

## 2012-11-05 MED ORDER — BISACODYL 10 MG RE SUPP: 10.0000 mg | Freq: Every day | RECTAL | Status: DC | PRN

## 2012-11-05 MED ORDER — SODIUM CHLORIDE 0.9 % IJ SOLN: 9.0000 mL | INTRAMUSCULAR | Status: DC | PRN

## 2012-11-05 MED ORDER — SODIUM CHLORIDE 0.9 % IV SOLN: 250.0000 mL | INTRAVENOUS | Status: DC

## 2012-11-05 MED ORDER — ONDANSETRON HCL 4 MG/2ML IJ SOLN
INTRAMUSCULAR | Status: DC | PRN
  Administered 2012-11-05: 4 mg via INTRAVENOUS

## 2012-11-05 MED ORDER — ACETAMINOPHEN 650 MG RE SUPP: 650.0000 mg | RECTAL | Status: DC | PRN

## 2012-11-05 MED ORDER — SUCCINYLCHOLINE CHLORIDE 20 MG/ML IJ SOLN
INTRAMUSCULAR | Status: DC | PRN
  Administered 2012-11-05: 140 mg via INTRAVENOUS

## 2012-11-05 MED ORDER — MIDAZOLAM HCL 2 MG/2ML IJ SOLN: 1.0000 mg | INTRAMUSCULAR | Status: DC | PRN

## 2012-11-05 MED ORDER — KCL IN DEXTROSE-NACL 20-5-0.45 MEQ/L-%-% IV SOLN
INTRAVENOUS | Status: DC
  Administered 2012-11-05: 17:00:00 via INTRAVENOUS
  Filled 2012-11-05 (×5): qty 1000

## 2012-11-05 MED ORDER — BUPIVACAINE HCL (PF) 0.5 % IJ SOLN
INTRAMUSCULAR | Status: DC | PRN
  Administered 2012-11-05: 16 mL

## 2012-11-05 MED ORDER — MIDAZOLAM HCL 5 MG/5ML IJ SOLN
INTRAMUSCULAR | Status: DC | PRN
  Administered 2012-11-05 (×2): 2 mg via INTRAVENOUS

## 2012-11-05 MED ORDER — CEFAZOLIN SODIUM-DEXTROSE 2-3 GM-% IV SOLR
INTRAVENOUS | Status: AC
  Filled 2012-11-05: qty 50

## 2012-11-05 MED ORDER — ACETAMINOPHEN 325 MG PO TABS: 650.0000 mg | ORAL_TABLET | ORAL | Status: DC | PRN

## 2012-11-05 MED ORDER — DIAZEPAM 5 MG PO TABS
5.0000 mg | ORAL_TABLET | Freq: Four times a day (QID) | ORAL | Status: DC | PRN
  Administered 2012-11-06 – 2012-11-07 (×3): 5 mg via ORAL
  Filled 2012-11-05 (×4): qty 1

## 2012-11-05 MED ORDER — PAROXETINE HCL 20 MG PO TABS
40.0000 mg | ORAL_TABLET | Freq: Every day | ORAL | Status: DC
  Filled 2012-11-05 (×3): qty 2

## 2012-11-05 MED ORDER — BACITRACIN 50000 UNITS IM SOLR
INTRAMUSCULAR | Status: AC
  Filled 2012-11-05: qty 1

## 2012-11-05 MED ORDER — OXYCODONE-ACETAMINOPHEN 5-325 MG PO TABS
1.0000 | ORAL_TABLET | ORAL | Status: DC | PRN
  Administered 2012-11-05 – 2012-11-07 (×7): 1 via ORAL
  Filled 2012-11-05 (×3): qty 1
  Filled 2012-11-05: qty 2
  Filled 2012-11-05 (×3): qty 1

## 2012-11-05 MED ORDER — OXYCODONE-ACETAMINOPHEN 10-325 MG PO TABS: 1.0000 | ORAL_TABLET | ORAL | Status: DC | PRN

## 2012-11-05 MED ORDER — LACTATED RINGERS IV SOLN
INTRAVENOUS | Status: DC | PRN
  Administered 2012-11-05 (×4): via INTRAVENOUS

## 2012-11-05 MED ORDER — NEOSTIGMINE METHYLSULFATE 1 MG/ML IJ SOLN
INTRAMUSCULAR | Status: DC | PRN
  Administered 2012-11-05: 4 mg via INTRAVENOUS

## 2012-11-05 MED ORDER — HYDROMORPHONE HCL PF 1 MG/ML IJ SOLN
INTRAMUSCULAR | Status: AC
  Filled 2012-11-05: qty 1

## 2012-11-05 MED ORDER — FENTANYL CITRATE 0.05 MG/ML IJ SOLN
INTRAMUSCULAR | Status: DC | PRN
  Administered 2012-11-05 (×2): 50 ug via INTRAVENOUS
  Administered 2012-11-05: 100 ug via INTRAVENOUS
  Administered 2012-11-05: 50 ug via INTRAVENOUS
  Administered 2012-11-05: 100 ug via INTRAVENOUS
  Administered 2012-11-05 (×5): 50 ug via INTRAVENOUS
  Administered 2012-11-05: 100 ug via INTRAVENOUS
  Administered 2012-11-05: 50 ug via INTRAVENOUS

## 2012-11-05 MED ORDER — CEFAZOLIN SODIUM 1-5 GM-% IV SOLN
1.0000 g | Freq: Three times a day (TID) | INTRAVENOUS | Status: AC
  Administered 2012-11-05 – 2012-11-06 (×2): 1 g via INTRAVENOUS
  Filled 2012-11-05 (×2): qty 50

## 2012-11-05 MED ORDER — PROMETHAZINE HCL 25 MG/ML IJ SOLN: 6.2500 mg | INTRAMUSCULAR | Status: DC | PRN

## 2012-11-05 MED ORDER — ALBUMIN HUMAN 5 % IV SOLN
INTRAVENOUS | Status: DC | PRN
  Administered 2012-11-05 (×2): via INTRAVENOUS

## 2012-11-05 MED ORDER — DIPHENHYDRAMINE HCL 12.5 MG/5ML PO ELIX: 12.5000 mg | ORAL_SOLUTION | Freq: Four times a day (QID) | ORAL | Status: DC | PRN

## 2012-11-05 MED ORDER — ONDANSETRON HCL 4 MG/2ML IJ SOLN
4.0000 mg | INTRAMUSCULAR | Status: DC | PRN
  Administered 2012-11-06: 4 mg via INTRAVENOUS
  Filled 2012-11-05: qty 2

## 2012-11-05 MED ORDER — EPHEDRINE SULFATE 50 MG/ML IJ SOLN
INTRAMUSCULAR | Status: DC | PRN
  Administered 2012-11-05: 10 mg via INTRAVENOUS
  Administered 2012-11-05: 15 mg via INTRAVENOUS

## 2012-11-05 MED ORDER — LIDOCAINE-EPINEPHRINE 1 %-1:100000 IJ SOLN
INTRAMUSCULAR | Status: DC | PRN
  Administered 2012-11-05: 16 mL

## 2012-11-05 MED ORDER — MORPHINE SULFATE (PF) 1 MG/ML IV SOLN
INTRAVENOUS | Status: AC
  Filled 2012-11-05: qty 25

## 2012-11-05 MED ORDER — SODIUM CHLORIDE 0.9 % IJ SOLN: 3.0000 mL | INTRAMUSCULAR | Status: DC | PRN

## 2012-11-05 MED ORDER — PROPOFOL 10 MG/ML IV BOLUS
INTRAVENOUS | Status: DC | PRN
  Administered 2012-11-05: 180 mg via INTRAVENOUS
  Administered 2012-11-05: 100 mg via INTRAVENOUS

## 2012-11-05 MED ORDER — SODIUM CHLORIDE 0.9 % IJ SOLN
3.0000 mL | Freq: Two times a day (BID) | INTRAMUSCULAR | Status: DC
  Administered 2012-11-06 (×2): 3 mL via INTRAVENOUS

## 2012-11-05 MED ORDER — ALBUTEROL SULFATE HFA 108 (90 BASE) MCG/ACT IN AERS
INHALATION_SPRAY | RESPIRATORY_TRACT | Status: DC | PRN
  Administered 2012-11-05: 4 via RESPIRATORY_TRACT

## 2012-11-05 MED ORDER — OXYCODONE HCL 5 MG PO TABS
5.0000 mg | ORAL_TABLET | ORAL | Status: DC | PRN
  Administered 2012-11-05 – 2012-11-06 (×2): 5 mg via ORAL
  Filled 2012-11-05 (×2): qty 1

## 2012-11-05 MED ORDER — MENTHOL 3 MG MT LOZG: 1.0000 | LOZENGE | OROMUCOSAL | Status: DC | PRN

## 2012-11-05 MED ORDER — BENAZEPRIL HCL 10 MG PO TABS
10.0000 mg | ORAL_TABLET | Freq: Every day | ORAL | Status: DC
  Administered 2012-11-05 – 2012-11-07 (×3): 10 mg via ORAL
  Filled 2012-11-05 (×3): qty 1

## 2012-11-05 MED ORDER — SODIUM CHLORIDE 0.9 % IV SOLN
10.0000 mg | INTRAVENOUS | Status: DC | PRN
  Administered 2012-11-05: 20 ug/min via INTRAVENOUS

## 2012-11-05 MED ORDER — SODIUM CHLORIDE 0.9 % IV SOLN
INTRAVENOUS | Status: AC
  Filled 2012-11-05: qty 500

## 2012-11-05 MED ORDER — ONDANSETRON HCL 4 MG/2ML IJ SOLN: 4.0000 mg | Freq: Four times a day (QID) | INTRAMUSCULAR | Status: DC | PRN

## 2012-11-05 SURGICAL SUPPLY — 85 items
ADH SKN CLS APL DERMABOND .7 (GAUZE/BANDAGES/DRESSINGS) ×4
ADH SKN CLS LQ APL DERMABOND (GAUZE/BANDAGES/DRESSINGS) ×2
APL SKNCLS STERI-STRIP NONHPOA (GAUZE/BANDAGES/DRESSINGS) ×2
BAG DECANTER FOR FLEXI CONT (MISCELLANEOUS) ×6 IMPLANT
BENZOIN TINCTURE PRP APPL 2/3 (GAUZE/BANDAGES/DRESSINGS) ×3 IMPLANT
BLADE SURG ROTATE 9660 (MISCELLANEOUS) IMPLANT
BONE VOID FILLER STRIP 10CC (Bone Implant) ×3 IMPLANT
CLOTH BEACON ORANGE TIMEOUT ST (SAFETY) ×6 IMPLANT
CONT SPEC 4OZ CLIKSEAL STRL BL (MISCELLANEOUS) ×6 IMPLANT
CORENT WIDE 10X22X55 (Orthopedic Implant) ×3 IMPLANT
COROENT WIDE 10X22X55 (Orthopedic Implant) ×2 IMPLANT
COROENT XL-W 10X22X50 (Orthopedic Implant) ×6 IMPLANT
COVER BACK TABLE 24X17X13 BIG (DRAPES) IMPLANT
COVER TABLE BACK 60X90 (DRAPES) ×3 IMPLANT
DERMABOND ADHESIVE PROPEN (GAUZE/BANDAGES/DRESSINGS) ×1
DERMABOND ADVANCED (GAUZE/BANDAGES/DRESSINGS) ×4
DERMABOND ADVANCED .7 DNX12 (GAUZE/BANDAGES/DRESSINGS) ×8 IMPLANT
DERMABOND ADVANCED .7 DNX6 (GAUZE/BANDAGES/DRESSINGS) ×2 IMPLANT
DRAPE C-ARM 42X72 X-RAY (DRAPES) ×6 IMPLANT
DRAPE C-ARMOR (DRAPES) ×6 IMPLANT
DRAPE LAPAROTOMY 100X72X124 (DRAPES) ×6 IMPLANT
DRAPE POUCH INSTRU U-SHP 10X18 (DRAPES) ×6 IMPLANT
DRAPE SURG 17X23 STRL (DRAPES) ×3 IMPLANT
DRESSING TELFA 8X3 (GAUZE/BANDAGES/DRESSINGS) ×3 IMPLANT
DURAPREP 26ML APPLICATOR (WOUND CARE) ×6 IMPLANT
ELECT REM PT RETURN 9FT ADLT (ELECTROSURGICAL) ×6
ELECTRODE REM PT RTRN 9FT ADLT (ELECTROSURGICAL) ×4 IMPLANT
GAUZE SPONGE 4X4 16PLY XRAY LF (GAUZE/BANDAGES/DRESSINGS) ×3 IMPLANT
GLOVE BIO SURGEON STRL SZ8 (GLOVE) ×12 IMPLANT
GLOVE BIOGEL PI IND STRL 7.5 (GLOVE) ×2 IMPLANT
GLOVE BIOGEL PI IND STRL 8 (GLOVE) ×4 IMPLANT
GLOVE BIOGEL PI IND STRL 8.5 (GLOVE) ×6 IMPLANT
GLOVE BIOGEL PI INDICATOR 7.5 (GLOVE) ×1
GLOVE BIOGEL PI INDICATOR 8 (GLOVE) ×2
GLOVE BIOGEL PI INDICATOR 8.5 (GLOVE) ×3
GLOVE ECLIPSE 7.5 STRL STRAW (GLOVE) ×18 IMPLANT
GLOVE ECLIPSE 8.0 STRL XLNG CF (GLOVE) ×6 IMPLANT
GLOVE EXAM NITRILE LRG STRL (GLOVE) IMPLANT
GLOVE EXAM NITRILE MD LF STRL (GLOVE) IMPLANT
GLOVE EXAM NITRILE XL STR (GLOVE) IMPLANT
GLOVE EXAM NITRILE XS STR PU (GLOVE) IMPLANT
GLOVE INDICATOR 8.0 STRL GRN (GLOVE) ×6 IMPLANT
GOWN BRE IMP SLV AUR LG STRL (GOWN DISPOSABLE) IMPLANT
GOWN BRE IMP SLV AUR XL STRL (GOWN DISPOSABLE) ×18 IMPLANT
GOWN STRL REIN 2XL LVL4 (GOWN DISPOSABLE) IMPLANT
KIT BASIN OR (CUSTOM PROCEDURE TRAY) ×6 IMPLANT
KIT DILATOR XLIF 5 (KITS) ×2 IMPLANT
KIT INFUSE MEDIUM (Orthopedic Implant) ×3 IMPLANT
KIT MAXCESS (KITS) ×3 IMPLANT
KIT NEEDLE NVM5 EMG ELECT (KITS) ×2 IMPLANT
KIT NEEDLE NVM5 EMG ELECTRODE (KITS) ×1
KIT POSITION SURG JACKSON T1 (MISCELLANEOUS) ×3 IMPLANT
KIT ROOM TURNOVER OR (KITS) ×6 IMPLANT
KIT XLIF (KITS) ×1
MARKER SKIN DUAL TIP RULER LAB (MISCELLANEOUS) ×3 IMPLANT
NEEDLE HYPO 25X1 1.5 SAFETY (NEEDLE) ×6 IMPLANT
NEEDLE TARGET 11MM (NEEDLE) ×12 IMPLANT
NS IRRIG 1000ML POUR BTL (IV SOLUTION) ×6 IMPLANT
PACK LAMINECTOMY NEURO (CUSTOM PROCEDURE TRAY) ×6 IMPLANT
PAD ARMBOARD 7.5X6 YLW CONV (MISCELLANEOUS) ×9 IMPLANT
PATTIES SURGICAL .5 X.5 (GAUZE/BANDAGES/DRESSINGS) IMPLANT
PATTIES SURGICAL .5 X1 (DISPOSABLE) IMPLANT
PATTIES SURGICAL 1X1 (DISPOSABLE) IMPLANT
ROD 100MM (Rod) ×6 IMPLANT
SCREW CANN PA ILLICO 5.5X45 (Screw) ×6 IMPLANT
SCREW CANN PA ILLICO 6.5X45 (Screw) ×12 IMPLANT
SCREW SET SPINAL STD HEXALOBE (Screw) ×18 IMPLANT
SPONGE GAUZE 4X4 12PLY (GAUZE/BANDAGES/DRESSINGS) ×3 IMPLANT
SPONGE LAP 4X18 X RAY DECT (DISPOSABLE) IMPLANT
STAPLER SKIN PROX WIDE 3.9 (STAPLE) ×3 IMPLANT
STRIP CLOSURE SKIN 1/2X4 (GAUZE/BANDAGES/DRESSINGS) ×3 IMPLANT
STRIP NEXOSS 5CC (Neuro Prosthesis/Implant) ×3 IMPLANT
SUT VIC AB 1 CT1 18XBRD ANBCTR (SUTURE) ×8 IMPLANT
SUT VIC AB 1 CT1 8-18 (SUTURE) ×12
SUT VIC AB 2-0 CT1 18 (SUTURE) ×12 IMPLANT
SUT VIC AB 3-0 SH 8-18 (SUTURE) ×12 IMPLANT
SYR 20ML ECCENTRIC (SYRINGE) ×6 IMPLANT
SYR INSULIN 1ML 31GX6 SAFETY (SYRINGE) IMPLANT
TAPE CLOTH 3X10 TAN LF (GAUZE/BANDAGES/DRESSINGS) ×9 IMPLANT
TAPE CLOTH SURG 4X10 WHT LF (GAUZE/BANDAGES/DRESSINGS) ×3 IMPLANT
TIP TROCAR NITINOL ILLICO 18 (INSTRUMENTS) ×18 IMPLANT
TOWEL OR 17X24 6PK STRL BLUE (TOWEL DISPOSABLE) ×6 IMPLANT
TOWEL OR 17X26 10 PK STRL BLUE (TOWEL DISPOSABLE) ×6 IMPLANT
TRAY FOLEY CATH 14FRSI W/METER (CATHETERS) ×6 IMPLANT
WATER STERILE IRR 1000ML POUR (IV SOLUTION) ×6 IMPLANT

## 2012-11-05 NOTE — OR Nursing (Signed)
End of part #1 of procedure 1052 Start of part #2 iof procedure 1126

## 2012-11-05 NOTE — Anesthesia Procedure Notes (Signed)
Procedure Name: Intubation Date/Time: 11/05/2012 7:42 AM Performed by: Armandina Gemma Pre-anesthesia Checklist: Patient identified, Emergency Drugs available, Suction available, Patient being monitored and Timeout performed Patient Re-evaluated:Patient Re-evaluated prior to inductionOxygen Delivery Method: Circle system utilized Preoxygenation: Pre-oxygenation with 100% oxygen Intubation Type: IV induction Ventilation: Mask ventilation without difficulty Laryngoscope Size: Miller and 2 Grade View: Grade I Tube type: Oral Tube size: 7.0 mm Number of attempts: 1 Airway Equipment and Method: Stylet Placement Confirmation: ETT inserted through vocal cords under direct vision,  positive ETCO2 and breath sounds checked- equal and bilateral Secured at: 22 cm Tube secured with: Tape Dental Injury: Teeth and Oropharynx as per pre-operative assessment  Comments: Very poor dentition- teeth as preop

## 2012-11-05 NOTE — Progress Notes (Signed)
Utilization Review Completed.   Jahquan Klugh, RN, BSN Nurse Case Manager  [phone]  

## 2012-11-05 NOTE — Preoperative (Signed)
Beta Blockers   Reason not to administer Beta Blockers:Not Applicable 

## 2012-11-05 NOTE — Op Note (Signed)
11/05/2012  1:35 PM  PATIENT:  Lynn [name]  61 y.o. female  PRE-OPERATIVE DIAGNOSIS:  Spondylolisthesis, Scoliosis, Lumbar stenosis, Lumbar radiculopathy L 23, L 34, L 45  POST-OPERATIVE DIAGNOSIS:  Spondylolisthesis, Scoliosis, Lumbar stenosis, Lumbar radiculopathy L 23, L 34, L 45   PROCEDURE:  Procedure(s) with comments: ANTERIOR LATERAL LUMBAR FUSION 3 LEVELS (Right) - Right Lumbar two -three, three-four,four-five  XLIF with percutaneous pedicle screws, C ARM LUMBAR PERCUTANEOUS PEDICLE SCREW 3 LEVEL (N/A)  SURGEON:  Surgeon(s) and Role:    * Keva Darty, MD - Primary    * David S Jones, MD - Assisting  PHYSICIAN ASSISTANT:   ASSISTANTS: Poteat, RN   ANESTHESIA:   general  EBL:  Total I/O In: 3500 [I.V.:3000; IV Piggyback:500] Out: 450 [Urine:300; Blood:150]  BLOOD ADMINISTERED:none  DRAINS: none   LOCAL MEDICATIONS USED:  LIDOCAINE   SPECIMEN:  No Specimen  DISPOSITION OF SPECIMEN:  N/A  COUNTS:  YES  TOURNIQUET:  * No tourniquets in log *  DICTATION: Patient is a 61-year-old with spondylosis stenosis and scoliosis of the lumbar spine. It was elected to take her to surgery for anterolateral decompression and posterior pedicle screw fixation.  Procedure: Patient was brought to the operating room and placed in a left lateral decubitus position on the operative table and using orthogonally projected C-arm fluoroscopy the patient was placed so that the L2-3 L3-4 and L4-5 levels were visualized in AP and lateral plane. The patient was then taped into position. The table was flexed so as to expose the L4-5 level as the patient has a high iliac crest. Skin was marked along with a posterior finger dissection incision. Her flank was then prepped and draped in usual sterile fashion and incisions were made sequentially at L4-5 L3-4 and L2-3 levels. Posterior finger dissection was made to enter the retroperitoneal space and then subsequently the probe was inserted into the  psoas muscle from the right side initially at the L4-5 level. After mapping the neural elements were able to dock the probe per the midpoint of this vertebral level and without indications electrically of too close proximity to the neural tissues. Subsequently the self-retaining tractor was.after sequential dilators were utilized the shim was employed and the interspace was cleared of psoas muscle and then incised. A thorough discectomy was performed. Various instruments were used to clear the interspace of disc material. After thorough discectomy was performed and this was performed using AP and lateral fluoroscopy a 10 lordotic by 55 x 22 mm implant was packed with BMP and NexOss with autologous blood. This was tamped into position using the slides and its position was confirmed on AP and lateral fluoroscopy. Subsequently exposure was performed at the L3-4 level and similar dissection was performed with locking of the self-retaining retractor. At this level were able to place a 10 lordotic by 22 x 55 mm implant packed in a similar fashion. At the L2-3 level were able to place a 10 mm lordotic by 55 x 22 mm implant packed in a similar fashion. Hemostasis  was assured the wounds were irrigated interrupted Vicryl sutures.  Patient was then turned into a prone position on the operating table on chest rolls and using AP and lateral fluoroscopy throughout this portion of the procedure, pedicle screws were placed using alphatech cannulated percutaneous screws. 2 screws were placed at L2 and (5.5 x 45 mm) and 1 at L3 and one at L4 of  (6.5 x 45 mm)  and 2 at L5 of   similar size. 100 mm rods were then affixed to the screw heads do a separate stab incision and locked down on the screws. All connections were then torqued and the Towers were disassembled. The wounds were irrigated and then closed with 1, 2-0 and 3-0 Vicryl stitches. Sterile occlusive dressing was placed with Dermabond. The patient was then extubated in the  operating room and taken to recovery in stable and satisfactory condition having tolerated her operation well. Counts were correct at the end of the case.   PLAN OF CARE: Admit to inpatient   PATIENT DISPOSITION:  PACU - hemodynamically stable.   Delay start of Pharmacological VTE agent (>24hrs) due to surgical blood loss or risk of bleeding: yes  

## 2012-11-05 NOTE — Anesthesia Postprocedure Evaluation (Signed)
  Anesthesia Post-op Note  Patient: Lynn [name]  Procedure(s) Performed: Procedure(s) with comments: ANTERIOR LATERAL LUMBAR FUSION 3 LEVELS (Right) - Right Lumbar two -three, three-four,four-five  XLIF with percutaneous pedicle screws, C ARM LUMBAR PERCUTANEOUS PEDICLE SCREW 3 LEVEL (N/A)  Patient Location: PACU  Anesthesia Type:General  Level of Consciousness: awake  Airway and Oxygen Therapy: Patient Spontanous Breathing  Post-op Pain: mild  Post-op Assessment: Post-op Vital signs reviewed  Post-op Vital Signs: stable  Complications: No apparent anesthesia complications

## 2012-11-05 NOTE — Progress Notes (Signed)
Awake, alert, conversant.  Complaining of back pain, no leg pain.  Full strength both lower extremities.  Doing well.

## 2012-11-05 NOTE — Anesthesia Preprocedure Evaluation (Addendum)
Anesthesia Evaluation  Patient identified by MRN, date of birth, ID band Patient awake    Reviewed: Allergy & Precautions, H&P , NPO status , Patient's Chart, lab work & pertinent test results  Airway Mallampati: II TM Distance: >3 FB Neck ROM: Full    Dental  (+) Teeth Intact and Dental Advisory Given   Pulmonary shortness of breath, sleep apnea , former smoker,  breath sounds clear to auscultation Pt. has raspy voice.  States she was a smoker but voice changed after anterior cerv. surg.        Cardiovascular hypertension, Pt. on medications Rhythm:Regular Rate:Normal     Neuro/Psych Anxiety Depression    GI/Hepatic hiatal hernia, GERD-  Medicated and Controlled,  Endo/Other  Morbid obesity  Renal/GU      Musculoskeletal   Abdominal (+) + obese,   Peds  Hematology   Anesthesia Other Findings hoarse  Reproductive/Obstetrics                          Anesthesia Physical Anesthesia Plan  ASA: III  Anesthesia Plan: General   Post-op Pain Management:    Induction: Intravenous  Airway Management Planned: Oral ETT  Additional Equipment:   Intra-op Plan:   Post-operative Plan: Extubation in OR  Informed Consent: I have reviewed the patients History and Physical, chart, labs and discussed the procedure including the risks, benefits and alternatives for the proposed anesthesia with the patient or authorized representative who has indicated his/her understanding and acceptance.     Plan Discussed with: CRNA and Surgeon  Anesthesia Plan Comments:         Anesthesia Quick Evaluation

## 2012-11-05 NOTE — Transfer of Care (Signed)
Immediate Anesthesia Transfer of Care Note  Patient: Lynn [name]  Procedure(s) Performed: Procedure(s) with comments: ANTERIOR LATERAL LUMBAR FUSION 3 LEVELS (Right) - Right Lumbar two -three, three-four,four-five  XLIF with percutaneous pedicle screws, C ARM LUMBAR PERCUTANEOUS PEDICLE SCREW 3 LEVEL (N/A)  Patient Location: PACU  Anesthesia Type:General  Level of Consciousness: awake, alert  and oriented  Airway & Oxygen Therapy: Patient Spontanous Breathing and Patient connected to face mask oxygen  Post-op Assessment: Report given to PACU RN and Post -op Vital signs reviewed and stable  Post vital signs: Reviewed and stable  Complications: No apparent anesthesia complications

## 2012-11-05 NOTE — Interval H&P Note (Signed)
History and Physical Interval Note:  11/05/2012 6:27 AM  Lynn [name]  has presented today for surgery, with the diagnosis of Spondylolisthesis, Scoliosis, Lumbar stenosis, Lumbar radiculopathy  The various methods of treatment have been discussed with the patient and family. After consideration of risks, benefits and other options for treatment, the patient has consented to  Procedure(s) with comments: ANTERIOR LATERAL LUMBAR FUSION 3 LEVELS (Right) - Right L2-3, 3-4, 4-5 XLIF with percutaneous pedicle screws, C ARM LUMBAR PERCUTANEOUS PEDICLE SCREW 3 LEVEL (N/A) as a surgical intervention .  The patient's history has been reviewed, patient examined, no change in status, stable for surgery.  I have reviewed the patient's chart and labs.  Questions were answered to the patient's satisfaction.     [name]

## 2012-11-05 NOTE — Brief Op Note (Signed)
11/05/2012  1:35 PM  PATIENT:  Lynn [name]  62 y.o. female  PRE-OPERATIVE DIAGNOSIS:  Spondylolisthesis, Scoliosis, Lumbar stenosis, Lumbar radiculopathy L 23, L 34, L 45  POST-OPERATIVE DIAGNOSIS:  Spondylolisthesis, Scoliosis, Lumbar stenosis, Lumbar radiculopathy L 23, L 34, L 45   PROCEDURE:  Procedure(s) with comments: ANTERIOR LATERAL LUMBAR FUSION 3 LEVELS (Right) - Right Lumbar two -three, three-four,four-five  XLIF with percutaneous pedicle screws, C ARM LUMBAR PERCUTANEOUS PEDICLE SCREW 3 LEVEL (N/A)  SURGEON:  Surgeon(s) and Role:    * Maeola Harman, MD - Primary    * Tia Alert, MD - Assisting  PHYSICIAN ASSISTANT:   ASSISTANTS: Poteat, RN   ANESTHESIA:   general  EBL:  Total I/O In: 3500 [I.V.:3000; IV Piggyback:500] Out: 450 [Urine:300; Blood:150]  BLOOD ADMINISTERED:none  DRAINS: none   LOCAL MEDICATIONS USED:  LIDOCAINE   SPECIMEN:  No Specimen  DISPOSITION OF SPECIMEN:  N/A  COUNTS:  YES  TOURNIQUET:  * No tourniquets in log *  DICTATION: Patient is a 62 year old with spondylosis stenosis and scoliosis of the lumbar spine. It was elected to take her to surgery for anterolateral decompression and posterior pedicle screw fixation.  Procedure: Patient was brought to the operating room and placed in a left lateral decubitus position on the operative table and using orthogonally projected C-arm fluoroscopy the patient was placed so that the L2-3 L3-4 and L4-5 levels were visualized in AP and lateral plane. The patient was then taped into position. The table was flexed so as to expose the L4-5 level as the patient has a high iliac crest. Skin was marked along with a posterior finger dissection incision. Her flank was then prepped and draped in usual sterile fashion and incisions were made sequentially at L4-5 L3-4 and L2-3 levels. Posterior finger dissection was made to enter the retroperitoneal space and then subsequently the probe was inserted into the  psoas muscle from the right side initially at the L4-5 level. After mapping the neural elements were able to dock the probe per the midpoint of this vertebral level and without indications electrically of too close proximity to the neural tissues. Subsequently the self-retaining tractor was.after sequential dilators were utilized the shim was employed and the interspace was cleared of psoas muscle and then incised. A thorough discectomy was performed. Various instruments were used to clear the interspace of disc material. After thorough discectomy was performed and this was performed using AP and lateral fluoroscopy a 10 lordotic by 55 x 22 mm implant was packed with BMP and NexOss with autologous blood. This was tamped into position using the slides and its position was confirmed on AP and lateral fluoroscopy. Subsequently exposure was performed at the L3-4 level and similar dissection was performed with locking of the self-retaining retractor. At this level were able to place a 10 lordotic by 22 x 55 mm implant packed in a similar fashion. At the L2-3 level were able to place a 10 mm lordotic by 55 x 22 mm implant packed in a similar fashion. Hemostasis  was assured the wounds were irrigated interrupted Vicryl sutures.  Patient was then turned into a prone position on the operating table on chest rolls and using AP and lateral fluoroscopy throughout this portion of the procedure, pedicle screws were placed using alphatech cannulated percutaneous screws. 2 screws were placed at L2 and (5.5 x 45 mm) and 1 at L3 and one at L4 of  (6.5 x 45 mm)  and 2 at L5 of  similar size. 100 mm rods were then affixed to the screw heads do a separate stab incision and locked down on the screws. All connections were then torqued and the Towers were disassembled. The wounds were irrigated and then closed with 1, 2-0 and 3-0 Vicryl stitches. Sterile occlusive dressing was placed with Dermabond. The patient was then extubated in the  operating room and taken to recovery in stable and satisfactory condition having tolerated her operation well. Counts were correct at the end of the case.   PLAN OF CARE: Admit to inpatient   PATIENT DISPOSITION:  PACU - hemodynamically stable.   Delay start of Pharmacological VTE agent (>24hrs) due to surgical blood loss or risk of bleeding: yes

## 2012-11-06 ENCOUNTER — Encounter (HOSPITAL_COMMUNITY): Payer: Self-pay | Admitting: Neurosurgery

## 2012-11-06 NOTE — Plan of Care (Signed)
Problem: Consults Goal: Diagnosis - Spinal Surgery Thoraco/Lumbar Spine Fusion     

## 2012-11-06 NOTE — Progress Notes (Signed)
Foley cath d/c'd at 0645. Patient tolerated well.

## 2012-11-06 NOTE — Evaluation (Signed)
Occupational Therapy Evaluation Patient Details Name: Lynn [name] MRN: [id number] DOB: [birth year] Today's Date: 11/06/2012 Time: 1478-2956 OT Time Calculation (min): 22 min  OT Assessment / Plan / Recommendation Clinical Impression  This 62 y.o. female underwent lumbar fusion.  Pt. will benefit from OT for the below deficits, to allow her to return home with supervision - min A from family    OT Assessment  Patient needs continued OT Services    Follow Up Recommendations  No OT follow up;Supervision - Intermittent    Barriers to Discharge None    Equipment Recommendations  3 in 1 bedside comode (tub DME TBD)    Recommendations for Other Services    Frequency  Min 2X/week    Precautions / Restrictions Precautions Precautions: Back Precaution Booklet Issued: Yes (comment) Precaution Comments: Pt instructed in 3/3 back precautions and log rolling technique Required Braces or Orthoses: Spinal Brace Spinal Brace: Lumbar corset Restrictions Weight Bearing Restrictions: No       ADL  Eating/Feeding: Independent Where Assessed - Eating/Feeding: Chair Grooming: Wash/dry hands;Supervision/safety Where Assessed - Grooming: Supported standing Upper Body Bathing: Minimal assistance Where Assessed - Upper Body Bathing: Unsupported sitting Lower Body Bathing: Maximal assistance Where Assessed - Lower Body Bathing: Supported sit to stand Upper Body Dressing: Minimal assistance Where Assessed - Upper Body Dressing: Unsupported sitting Lower Body Dressing: +1 Total assistance Where Assessed - Lower Body Dressing: Supported sit to Pharmacist, hospital: Hydrographic surveyor Method: Sit to Barista: Raised toilet seat with arms (or 3-in-1 over toilet) Toileting - Clothing Manipulation and Hygiene: Maximal assistance (unable to perform peri care due to pannis) Where Assessed - Toileting Clothing Manipulation and Hygiene: Standing Equipment Used: Back  brace;Rolling walker Transfers/Ambulation Related to ADLs: min guard with RW ADL Comments: Pt. unable to access feet for LB ADLs, and unable to perfom peri care due to pannis.  Will need AE    OT Diagnosis:    OT Problem List: Decreased strength;Decreased knowledge of use of DME or AE;Decreased knowledge of precautions;Pain;Obesity OT Treatment Interventions: Self-care/ADL training;DME and/or AE instruction;Therapeutic activities;Patient/family education   OT Goals Acute Rehab OT Goals OT Goal Formulation: With patient Time For Goal Achievement: 11/13/12 Potential to Achieve Goals: Good ADL Goals Pt Will Perform Grooming: with modified independence;Standing at sink ADL Goal: Grooming - Progress: Goal set today Pt Will Perform Lower Body Bathing: with supervision;Sit to stand from bed;Sit to stand from chair;with adaptive equipment ADL Goal: Lower Body Bathing - Progress: Goal set today Pt Will Perform Lower Body Dressing: with supervision;Sit to stand from chair;Sit to stand from bed;with adaptive equipment ADL Goal: Lower Body Dressing - Progress: Goal set today Pt Will Transfer to Toilet: with modified independence;3-in-1;Maintaining back safety precautions ADL Goal: Toilet Transfer - Progress: Goal set today Pt Will Perform Toileting - Clothing Manipulation: with modified independence;Standing ADL Goal: Toileting - Clothing Manipulation - Progress: Goal set today Pt Will Perform Toileting - Hygiene: with supervision;with adaptive equipment;Sit to stand from 3-in-1/toilet ADL Goal: Toileting - Hygiene - Progress: Goal set today Pt Will Perform Tub/Shower Transfer: with supervision;with DME;Ambulation;Tub transfer;Maintaining back safety precautions ADL Goal: Tub/Shower Transfer - Progress: Goal set today  Visit Information  Last OT Received On: 11/06/12 Assistance Needed: +1    Subjective Data  Subjective: "I think it's making me feel better"  re: working with therapies Patient  Stated Goal: To regain independence   Prior Functioning     Home Living Lives With: Daughter Available Help at  Discharge: Family;Available 24 hours/day;Other (Comment) (pt. will stay with Mom and brother at DC) Type of Home: Mobile home Home Access: Level entry Home Layout: One level Bathroom Shower/Tub: Tub/shower unit;Curtain Firefighter: Standard Bathroom Accessibility: Yes How Accessible: Accessible via walker Home Adaptive Equipment: Reacher Prior Function Level of Independence: Independent Able to Take Stairs?: Yes Driving: Yes Vocation: On disability Communication Communication: No difficulties Dominant Hand: Right         Vision/Perception     Cognition  Cognition Overall Cognitive Status: Appears within functional limits for tasks assessed/performed Arousal/Alertness: Awake/alert Orientation Level: Oriented X4 / Intact Behavior During Session: [facility] for tasks performed    Extremity/Trunk Assessment Right Upper Extremity Assessment RUE ROM/Strength/Tone: WFL for tasks assessed RUE Coordination: WFL - gross/fine motor Left Upper Extremity Assessment LUE ROM/Strength/Tone: WFL for tasks assessed LUE Coordination: WFL - gross/fine motor     Mobility Bed Mobility Bed Mobility: Rolling Right;Right Sidelying to Sit;Sitting - Scoot to [place] of Bed Rolling Right: 4: Min guard Right Sidelying to Sit: 4: Min guard;HOB flat Sitting - Scoot to Edge of Bed: 6: Modified independent (Device/Increase time) Sit to Sidelying Right: 5: Supervision;HOB flat Details for Bed Mobility Assistance: Pt demonstrates good technique and adheres to back precautions Transfers Transfers: Sit to Stand;Stand to Sit Sit to Stand: 4: Min guard;With upper extremity assist;From chair/3-in-1;From bed Stand to Sit: 4: Min guard;With upper extremity assist;To bed;To chair/3-in-1 Details for Transfer Assistance: Cues for hand placement     Exercise     Balance     End of Session OT -  End of Session Equipment Utilized During Treatment: Back brace Activity Tolerance: Patient tolerated treatment well Patient left: in bed;with call bell/phone within reach;with bed alarm set;with family/visitor present  GO     Parrish Daddario, [name] M 11/06/2012, 3:07 PM

## 2012-11-06 NOTE — Evaluation (Signed)
Physical Therapy Evaluation Patient Details Name: Lynn [name] MRN: [id number] DOB: [birth year] Today's Date: 11/06/2012 Time: 4098-1191 PT Time Calculation (min): 38 min  PT Assessment / Plan / Recommendation Clinical Impression  Pt. underwent 3 level lumbar fusion and presents to PT with decreased independence in bed mobility, transfers gait, pain, and residual numbness right thigh.  She will benefit from acute PT to address these and below issues.  Pt. did well first time up and as long as she continues along this line of progression, I believe she will be able to DC home with 24 hour assist from family.    PT Assessment  Patient needs continued PT services    Follow Up Recommendations  Home health PT;Supervision/Assistance - 24 hour;Supervision for mobility/OOB    Does the patient have the potential to tolerate intense rehabilitation      Barriers to Discharge None pt. says brother and mother available 24/7 at mother's home    Equipment Recommendations  Rolling walker with 5" wheels    Recommendations for Other Services     Frequency Min 5X/week    Precautions / Restrictions Precautions Precautions: Back Precaution Booklet Issued: Yes (comment) Precaution Comments: Pt instructed in 3/3 back precautions and log rolling technique Required Braces or Orthoses: Spinal Brace Spinal Brace: Lumbar corset Restrictions Weight Bearing Restrictions: No   Pertinent Vitals/Pain Back pain 10/10; RN in with pain med      Mobility  Bed Mobility Bed Mobility: Rolling Right;Right Sidelying to Sit;Sitting - Scoot to [place] of Bed Rolling Right: 4: Min assist Right Sidelying to Sit: 4: Min assist;HOB flat Sitting - Scoot to Edge of Bed: 6: Modified independent (Device/Increase time) Details for Bed Mobility Assistance: cues for technique and precautions/log rolling technique; needed extra time for scooting to edge of bed Transfers Transfers: Sit to Stand;Stand to Sit Sit to Stand:  4: Min assist;From bed;With upper extremity assist Stand to Sit: 4: Min assist;To chair/3-in-1;With armrests Details for Transfer Assistance: cues for hand placement and technique, min assist to rise to stand and to control descent Ambulation/Gait Ambulation/Gait Assistance: 4: Min assist Ambulation Distance (Feet): 20 Feet Assistive device: Rolling walker Ambulation/Gait Assistance Details: cues for erect posture as she tends to flex trunk slightly; min assist for stability and safety  Pt. 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  She has built up shoe for this.  Need to use built up shoe for gait training.  It will be  available in room tomorrow. Gait Pattern: Step-through pattern;Trunk flexed Gait velocity: slow Stairs: No    Exercises     PT Diagnosis: Difficulty walking;Abnormality of gait;Acute pain  PT Problem List: Decreased activity tolerance;Decreased mobility;Decreased knowledge of use of DME;Decreased knowledge of precautions;Pain PT Treatment Interventions: DME instruction;Gait training;Functional mobility training;Therapeutic activities;Patient/family education   PT Goals Acute Rehab PT Goals PT Goal Formulation: With patient Time For Goal Achievement: 11/13/12 Potential to Achieve Goals: Good Pt will Roll Supine to Right Side: with modified independence PT Goal: Rolling Supine to Right Side - Progress: Goal set today Pt will Roll Supine to Left Side: with modified independence PT Goal: Rolling Supine to Left Side - Progress: Goal set today Pt will go Supine/Side to Sit: with modified independence PT Goal: Supine/Side to Sit - Progress: Goal set today Pt will go Sit to Supine/Side: with modified independence PT Goal: Sit to Supine/Side - Progress: Goal set today Pt will go Sit to Stand: with modified independence;with upper extremity assist  PT Goal: Sit to Stand - Progress: Goal set today Pt will go Stand to Sit: with modified  independence PT Goal: Stand to Sit - Progress: Goal set today Pt will Transfer Bed to Chair/Chair to Bed: with modified independence PT Transfer Goal: Bed to Chair/Chair to Bed - Progress: Goal set today Pt will Ambulate: >150 feet;with modified independence;with least restrictive assistive device PT Goal: Ambulate - Progress: Goal set today Additional Goals Additional Goal #1: pt. will state and adhere to 3/3 back precautions and log rolling technique PT Goal: Additional Goal #1 - Progress: Goal set today  Visit Information  Last PT Received On: 11/06/12 Assistance Needed: +1    Subjective Data  Subjective: Pt. states mom and brother will be wiith her after DC Patient Stated Goal: "gardening, housework, exercise and lose weight)   Prior Functioning  Home Living Lives With: Daughter Available Help at Discharge: Family;Available 24 hours/day;Other (Comment) (pt. will stay with Mom and brother at DC) Type of Home: Mobile home Home Access: Level entry Home Layout: One level Bathroom Shower/Tub: Tub/shower unit;Curtain Firefighter: Standard Bathroom Accessibility: Yes How Accessible: Accessible via walker Home Adaptive Equipment: None Prior Function Level of Independence: Independent Able to Take Stairs?: Yes Driving: Yes Vocation: On disability Communication Communication: No difficulties Dominant Hand: Right    Cognition  Cognition Overall Cognitive Status: Appears within functional limits for tasks assessed/performed Arousal/Alertness: Awake/alert Orientation Level: Oriented X4 / Intact Behavior During Session: [facility] for tasks performed    Extremity/Trunk Assessment Right Upper Extremity Assessment RUE ROM/Strength/Tone: [facility] for tasks assessed Left Upper Extremity Assessment LUE ROM/Strength/Tone: WFL for tasks assessed Right Lower Extremity Assessment RLE ROM/Strength/Tone: WFL for tasks assessed RLE Sensation: Deficits RLE Sensation Deficits: feels "like it's  asleep" right anterolateral thigh Left Lower Extremity Assessment LLE ROM/Strength/Tone: WFL for tasks assessed LLE Sensation: WFL - Proprioception Trunk Assessment Trunk Assessment: Normal   Balance    End of Session PT - End of Session Equipment Utilized During Treatment: Gait belt;Back brace Activity Tolerance: Patient tolerated treatment well Patient left: in chair;with call bell/phone within reach Nurse Communication: Mobility status  GP     Ferman Hamming 11/06/2012, 12:48 PM Weldon Picking PT Acute Rehab Services [phone] Beeper [phone]

## 2012-11-06 NOTE — Clinical Social Work Note (Signed)
Clinical Social Work   CSW received a consult for SNF. CSW reviewed chart and discussed pt with RN during progression. Awaiting PT/OT recommendations for discharge. CSW will assess pt for SNF, if appropriate. Please call with any urgent needs. CSW will continue to follow.   Dede Query, MSW, Theresia Majors [phone]

## 2012-11-06 NOTE — Progress Notes (Signed)
Morphine PCA discontinued this AM per order. Pain controlled on PO medication. Voiding post foley catheter removal. Jamire Shabazz, [place] Marie, RN

## 2012-11-06 NOTE — Progress Notes (Signed)
Subjective: Patient reports "I have pain in my back and some numbness in this leg...no worse than before surgery."  Objective: Vital signs in last 24 hours: Temp:  [97.8 F (36.6 C)-98.5 F (36.9 C)] 98.5 F (36.9 C) (02/21 0600) Pulse Rate:  [97-114] 105 (02/21 0600) Resp:  [11-27] 13 (02/21 0746) BP: (113-165)/(65-90) 120/79 mmHg (02/21 0600) SpO2:  [92 %-100 %] 96 % (02/21 0746) Weight:  [100.9 kg (222 lb 7.1 oz)] 100.9 kg (222 lb 7.1 oz) (02/20 1555)  Intake/Output from previous day: 02/20 0701 - 02/21 0700 In: 3575 [I.V.:3075; IV Piggyback:500] Out: 1500 [Urine:1350; Blood:150] Intake/Output this shift:    Alert, conversant. MAEW. Good strength BLE. Incisions with Dermabond. No erythema, swelling, or drainage. Pt requests d/c of pca d/t alarm keeping her awake.   Lab Results:  Recent Labs  11/04/12 0819  WBC 8.0  HGB 15.8*  HCT 47.0*  PLT 225   BMET  Recent Labs  11/04/12 0819  NA 143  K 4.0  CL 106  CO2 28  GLUCOSE 107*  BUN 21  CREATININE 0.66  CALCIUM 9.5    Studies/Results: Dg Chest 2 View  11/04/2012  *RADIOLOGY REPORT*  Clinical Data: Preop lumbar fusion.  CHEST - 2 VIEW  Comparison: 10/11/2011.  Findings: Trachea is midline.  Heart size normal.  Minimal left basilar subsegmental atelectasis or scarring.  Lungs are otherwise clear.  No pleural fluid prominent osteophytosis in the thoracic spine.  IMPRESSION: No acute findings.   Original Report Authenticated By: Leanna Battles, [name].    Dg Lumbar Spine 2-3 Views  11/05/2012  *RADIOLOGY REPORT*  Clinical Data: Low back pain  DG C-ARM GT 120 MIN,LUMBAR SPINE - 2-3 VIEW  Comparison: 10/08/2012  Findings: C-arm films document L2-L5 X L I F with pedicle screw and rod construct  IMPRESSION: Satisfactory position and alignment   Original Report Authenticated By: Davonna Belling, [name].    Dg C-arm Gt 120 Min  11/05/2012  *RADIOLOGY REPORT*  Clinical Data: Low back pain  DG C-ARM GT 120 MIN,LUMBAR SPINE - 2-3 VIEW   Comparison: 10/08/2012  Findings: C-arm films document L2-L5 X L I F with pedicle screw and rod construct  IMPRESSION: Satisfactory position and alignment   Original Report Authenticated By: Davonna Belling, [name].     Assessment/Plan: Improving   LOS: 1 day  Per Dr. Venetia Maxon, d/c PCA, continue po prn medications for pain and spasm. Continue to mobilize in LSO and work with PT.   Georgiann Cocker 11/06/2012, 8:15 AM

## 2012-11-06 NOTE — Clinical Social Work Note (Signed)
Clinical Social Work   CSW received chart. PT is recommending home health services. CSW will update RNCM. CSW is signing off at this time, as no further needs are identified. Please reconsult if a need arises prior to discharge.   Dede Query, MSW, Theresia Majors [phone]

## 2012-11-07 DIAGNOSIS — M4120 Other idiopathic scoliosis, site unspecified: Secondary | ICD-10-CM | POA: Diagnosis not present

## 2012-11-07 MED ORDER — OXYCODONE HCL 5 MG PO TABS: 5.0000 mg | ORAL_TABLET | ORAL | Status: DC | PRN

## 2012-11-07 MED ORDER — OXYCODONE-ACETAMINOPHEN 5-325 MG PO TABS: 1.0000 | ORAL_TABLET | ORAL | Status: DC | PRN

## 2012-11-07 MED ORDER — INFLUENZA VIRUS VACC SPLIT PF IM SUSP
0.5000 mL | INTRAMUSCULAR | Status: AC
  Administered 2012-11-07: 0.5 mL via INTRAMUSCULAR
  Filled 2012-11-07 (×2): qty 0.5

## 2012-11-07 MED ORDER — PNEUMOCOCCAL VAC POLYVALENT 25 MCG/0.5ML IJ INJ
0.5000 mL | INJECTION | INTRAMUSCULAR | Status: AC
  Administered 2012-11-07: 0.5 mL via INTRAMUSCULAR
  Filled 2012-11-07 (×2): qty 0.5

## 2012-11-07 NOTE — Care Management Note (Signed)
   CARE MANAGEMENT NOTE 11/07/2012  Patient:  Lynn, [name]   Account Number:  [id number]  Date Initiated:  11/05/2012  Documentation initiated by:  [facility]  Subjective/Objective Assessment:   admitted postop L2-3, L3-4, L4-5 decompression and fusion     Action/Plan:   PT/OT evals   Anticipated DC Date:  11/07/2012   Anticipated DC Plan:  HOME W HOME HEALTH SERVICES      DC Planning Services  CM consult      [facility] Choice  HOME HEALTH   Choice offered to / List presented to:  C-1 Patient   DME arranged  Levan Hurst      DME agency  [facility]     HH arranged  HH-2 PT      [facility] agency  [facility]   Status of service:  Completed, signed off Medicare Important Message given?   (If response is "NO", the following Medicare IM given date fields will be blank) Date Medicare IM given:   Date Additional Medicare IM given:    Discharge Disposition:  HOME W HOME HEALTH SERVICES  Per UR Regulation:  Reviewed for med. necessity/level of care/duration of stay  If discussed at Long Length of Stay Meetings, dates discussed:    Comments:  11-07-12 Received referral for PT Home Health services. Spoke with patient who states she lives with her mother and she already has rolling walker already. Chose [facility] for home health PT services. Spoke with [facility] referral center who confirms they can receive referral for PT services. Faxed face sheet to [facility] with faxed confirmation received. Made patient's nurse aware of the above information as well. ATIKAHALLRNCM [phone]

## 2012-11-07 NOTE — Progress Notes (Signed)
Physical Therapy Treatment Patient Details Name: Lynn [name] MRN: [id number] DOB: 1951/04/07 Today's Date: 11/07/2012 Time: 8295-6213 PT Time Calculation (min): 21 min  PT Assessment / Plan / Recommendation Comments on Treatment Session  Pt. underwent 3 level lumbar fusion and has progressed well with plan to d/c home today with family assist. Pt required further instruction re: back precautions this date and therefore recommend HHPT for follow-up education/home safety eval.     Follow Up Recommendations  Home health PT;Supervision for mobility/OOB     Does the patient have the potential to tolerate intense rehabilitation     Barriers to Discharge        Equipment Recommendations  Rolling walker with 5" wheels    Recommendations for Other Services    Frequency Min 5X/week   Plan Discharge plan remains appropriate;Frequency remains appropriate    Precautions / Restrictions Precautions Precautions: Back Precaution Comments: Pt able to state 1 precaution; educated (with handout and demonstration); pt able to mobilize adhering to precautions Required Braces or Orthoses: Spinal Brace Spinal Brace: Lumbar corset;Applied in sitting position   Pertinent Vitals/Pain Back pain 6/10; RN in to provide pain medicine    Mobility  Bed Mobility Bed Mobility: Rolling Right;Right Sidelying to Sit;Sitting - Scoot to Edge of Bed Rolling Right: 6: Modified independent (Device/Increase time);With rail Right Sidelying to Sit: 6: Modified independent (Device/Increase time);HOB flat;With rails Sitting - Scoot to Edge of Bed: 6: Modified independent (Device/Increase time) Details for Bed Mobility Assistance: pt reports she has a hospital bed at home with rail Transfers Transfers: Sit to Stand;Stand to Sit Sit to Stand: 4: Min guard;With upper extremity assist;From bed Stand to Sit: 4: Min guard;With upper extremity assist;To chair/3-in-1 Details for Transfer Assistance: attempted stand from  bed pushing off bed and pt unable without physical assist; pt prefers to push down on handles of RW (with RW stabilized by PT) and then able to stand without physical assist Ambulation/Gait Ambulation/Gait Assistance: 6: Modified independent (Device/Increase time) Ambulation Distance (Feet): 150 Feet Assistive device: Rolling walker Ambulation/Gait Assistance Details: excellent use of RW; built-up shoe present and discussed need to use at all times when up on her feet; discussed possible need for elastic shoe laces to incr ease of donning (with pt and OT) Gait Pattern: Within Functional Limits Gait velocity: slow Stairs: Yes Stairs Assistance: Other (comment) (verbally discussed technique for 1 step (with RW vs HHA)) Stair Management Technique: Forwards;With walker Number of Stairs: 1    Exercises     PT Diagnosis:    PT Problem List:   PT Treatment Interventions:     PT Goals Acute Rehab PT Goals Pt will Roll Supine to Right Side: with modified independence PT Goal: Rolling Supine to Right Side - Progress: Met Pt will go Supine/Side to Sit: with modified independence PT Goal: Supine/Side to Sit - Progress: Met Pt will go Sit to Stand: with modified independence;with upper extremity assist PT Goal: Sit to Stand - Progress: Progressing toward goal Pt will go Stand to Sit: with modified independence PT Goal: Stand to Sit - Progress: Progressing toward goal Pt will Transfer Bed to Chair/Chair to Bed: with modified independence PT Transfer Goal: Bed to Chair/Chair to Bed - Progress: Progressing toward goal Pt will Ambulate: >150 feet;with modified independence;with least restrictive assistive device PT Goal: Ambulate - Progress: Progressing toward goal Additional Goals Additional Goal #1: pt. will state and adhere to 3/3 back precautions and log rolling technique PT Goal: Additional Goal #1 - Progress: Progressing  toward goal  Visit Information  Last PT Received On:  11/07/12 Assistance Needed: +1    Subjective Data  Subjective: Has questions re: bathing and dressing   Cognition       Balance     End of Session PT - End of Session Equipment Utilized During Treatment: Gait belt;Back brace Activity Tolerance: Patient tolerated treatment well Patient left: in chair;with call bell/phone within reach Nurse Communication: Mobility status;Other (comment) (DME and HHPT nees)   GP     Lavoris Canizales 11/07/2012, 10:06 AM Pager [phone]

## 2012-11-07 NOTE — Progress Notes (Signed)
Patient ID: Lynn [name], female   DOB: 1950/10/10, 62 y.o.   MRN: [id number] Doing well postop day 2 from a two-level excellent plan discharge today

## 2012-11-07 NOTE — Discharge Summary (Signed)
  Physician Discharge Summary  Patient ID: MARANDA MARTE MRN: [id number] DOB/AGE: 17-Jan-1951 62 y.o.  Admit date: 11/05/2012 Discharge date: 11/07/2012  Admission Diagnoses: Lumbar spondylosis with stenosis L3-4 L4-5  Discharge Diagnoses: Same Active Problems:   * No active hospital problems. *   Discharged Condition: good  Hospital Course: Patient is in the hospital the 20th underwent a two-level anterior lateral interbody fusion postoperatively patient did very well by postop day 2 she was angling and voiding spontaneously tolerating regular diet pain was well controlled on pills and patient stable to be discharged home scheduled followup approximately 1-2 weeks with Dr. Venetia Maxon to  Consults: Significant Diagnostic Studies: Treatments: L3-4 L4-5 anterior lateral interbody fusion Discharge Exam: Blood pressure 117/80, pulse 108, temperature 98.7 F (37.1 C), temperature source Oral, resp. rate 20, height 5\' 1"  (1.549 m), weight 100.9 kg (222 lb 7.1 oz), SpO2 93.00%. Strength out of 5 wound is clean and dry  Disposition: Home     Medication List    TAKE these medications       benazepril 10 MG tablet  Commonly known as:  LOTENSIN  Take 10 mg by mouth daily.     esomeprazole 40 MG capsule  Commonly known as:  NEXIUM  Take 40 mg by mouth daily before breakfast.     ibuprofen 200 MG tablet  Commonly known as:  ADVIL,MOTRIN  Take 400 mg by mouth every 6 (six) hours as needed. For back pain     oxyCODONE 5 MG immediate release tablet  Commonly known as:  Oxy IR/ROXICODONE  Take 1 tablet (5 mg total) by mouth every 4 (four) hours as needed.     oxyCODONE-acetaminophen 5-325 MG per tablet  Commonly known as:  PERCOCET/ROXICET  Take 1 tablet by mouth every 4 (four) hours as needed.     oxyCODONE-acetaminophen 10-325 MG per tablet  Commonly known as:  PERCOCET  Take 1 tablet by mouth every 4 (four) hours as needed for pain.     PARoxetine 40 MG tablet  Commonly known  as:  PAXIL  Take 40 mg by mouth daily.           Follow-up Information   Follow up with Dorian Heckle, MD.   Contact information:   [address], [address] [place] [address] [phone]       Signed: Mariam Dollar 11/07/2012, 8:42 AM

## 2012-11-07 NOTE — Progress Notes (Signed)
Occupational Therapy Treatment Patient Details Name: Lynn [name] MRN: [id number] DOB: 1951-05-28 Today's Date: 11/07/2012 Time: 4098-1191 OT Time Calculation (min): 19 min  OT Assessment / Plan / Recommendation Comments on Treatment Session Pt only instructed verbally on LB  ADLs and AE provided due to pt being in a hurry to leave hospital.  Pt and dtr instructed on options for HHOT if needed    Follow Up Recommendations  No OT follow up;Supervision - Intermittent    Barriers to Discharge       Equipment Recommendations  3 in 1 bedside comode    Recommendations for Other Services    Frequency Min 2X/week   Plan Discharge plan remains appropriate    Precautions / Restrictions Precautions Precautions: Back Precaution Comments: Pt instructed in back precautions Required Braces or Orthoses: Spinal Brace Spinal Brace: Lumbar corset;Applied in sitting position Restrictions Weight Bearing Restrictions: No   Pertinent Vitals/Pain     ADL  ADL Comments: Attempted to see pt at [phone]; however, pt was working with PT.  Instructed pt I would return in 45 mins - 1 hour, and pt assured OT that she would not be leaving during that time.  Upon return, pt dressed, and preparing to wheel out for discharge.  Pt decided to stay for OT session, but requested that we only verbally go over information/instruction.  Dtr present and reports that pt will not be alone during day.   Pt was instructed in use of AE for LB ADLs, and was provided a hip kit and elestic shoe laces via indigent supply.  Pt. and dtr also instructed in options for toileting and toilet aids.    Attepmeted a simulated tub transfer, but pt unable to lift Lt LE high enough to clear tub.  Discussed options, and pt reports her boyfriend has a tub transfer bench.  Verbally instructed them how to use it.  Also instructed pt and dtr if she has difficulty using AE, or experiences difficulty with ADLs at home to ask HHPT, or her MD for a  referral for HHOT.  Dtr states this isn't necessary, however, pt is unsure.      OT Diagnosis:    OT Problem List:   OT Treatment Interventions:     OT Goals ADL Goals ADL Goal: Lower Body Bathing - Progress: Progressing toward goals ADL Goal: Lower Body Dressing - Progress: Progressing toward goals ADL Goal: Toilet Transfer - Progress: Progressing toward goals ADL Goal: Toileting - Clothing Manipulation - Progress: Progressing toward goals ADL Goal: Toileting - Hygiene - Progress: Progressing toward goals ADL Goal: Tub/Shower Transfer - Progress: Progressing toward goals  Visit Information  Last OT Received On: 11/07/12 Assistance Needed: +1    Subjective Data      Prior Functioning       Cognition  Cognition Overall Cognitive Status: Appears within functional limits for tasks assessed/performed Arousal/Alertness: Awake/alert Orientation Level: Oriented X4 / Intact Behavior During Session: [facility] for tasks performed    Mobility  Bed Mobility Bed Mobility: Rolling Right;Right Sidelying to Sit;Sitting - Scoot to Edge of Bed Rolling Right: 6: Modified independent (Device/Increase time);With rail Right Sidelying to Sit: 6: Modified independent (Device/Increase time);HOB flat;With rails Sitting - Scoot to Edge of Bed: 6: Modified independent (Device/Increase time) Details for Bed Mobility Assistance: pt reports she has a hospital bed at home with rail Transfers Sit to Stand: 4: Min guard;With upper extremity assist;From bed Stand to Sit: 4: Min guard;With upper extremity assist;To chair/3-in-1 Details for Transfer Assistance:  attempted stand from bed pushing off bed and pt unable without physical assist; pt prefers to push down on handles of RW (with RW stabilized by PT) and then able to stand without physical assist    Exercises      Balance     End of Session OT - End of Session Equipment Utilized During Treatment: Back brace Activity Tolerance: Patient tolerated  treatment well Patient left: in chair;with call bell/phone within reach;with family/visitor present  GO     Lynn [name], Lynn [name] 11/07/2012, 12:06 PM

## 2012-11-09 DIAGNOSIS — IMO0001 Reserved for inherently not codable concepts without codable children: Secondary | ICD-10-CM | POA: Diagnosis not present

## 2012-11-09 DIAGNOSIS — Z4789 Encounter for other orthopedic aftercare: Secondary | ICD-10-CM | POA: Diagnosis not present

## 2012-11-09 DIAGNOSIS — I10 Essential (primary) hypertension: Secondary | ICD-10-CM | POA: Diagnosis not present

## 2012-11-09 DIAGNOSIS — F4310 Post-traumatic stress disorder, unspecified: Secondary | ICD-10-CM | POA: Diagnosis not present

## 2012-11-12 DIAGNOSIS — IMO0001 Reserved for inherently not codable concepts without codable children: Secondary | ICD-10-CM | POA: Diagnosis not present

## 2012-11-12 DIAGNOSIS — I10 Essential (primary) hypertension: Secondary | ICD-10-CM | POA: Diagnosis not present

## 2012-11-12 DIAGNOSIS — Z4789 Encounter for other orthopedic aftercare: Secondary | ICD-10-CM | POA: Diagnosis not present

## 2012-11-12 DIAGNOSIS — F4310 Post-traumatic stress disorder, unspecified: Secondary | ICD-10-CM | POA: Diagnosis not present

## 2012-11-17 DIAGNOSIS — F4310 Post-traumatic stress disorder, unspecified: Secondary | ICD-10-CM | POA: Diagnosis not present

## 2012-11-17 DIAGNOSIS — I10 Essential (primary) hypertension: Secondary | ICD-10-CM | POA: Diagnosis not present

## 2012-11-17 DIAGNOSIS — IMO0001 Reserved for inherently not codable concepts without codable children: Secondary | ICD-10-CM | POA: Diagnosis not present

## 2012-11-17 DIAGNOSIS — Z4789 Encounter for other orthopedic aftercare: Secondary | ICD-10-CM | POA: Diagnosis not present

## 2012-11-19 DIAGNOSIS — I10 Essential (primary) hypertension: Secondary | ICD-10-CM | POA: Diagnosis not present

## 2012-11-19 DIAGNOSIS — Z4789 Encounter for other orthopedic aftercare: Secondary | ICD-10-CM | POA: Diagnosis not present

## 2012-11-19 DIAGNOSIS — F4310 Post-traumatic stress disorder, unspecified: Secondary | ICD-10-CM | POA: Diagnosis not present

## 2012-11-19 DIAGNOSIS — IMO0001 Reserved for inherently not codable concepts without codable children: Secondary | ICD-10-CM | POA: Diagnosis not present

## 2012-11-21 DIAGNOSIS — F4310 Post-traumatic stress disorder, unspecified: Secondary | ICD-10-CM | POA: Diagnosis not present

## 2012-11-21 DIAGNOSIS — I10 Essential (primary) hypertension: Secondary | ICD-10-CM | POA: Diagnosis not present

## 2012-11-21 DIAGNOSIS — Z4789 Encounter for other orthopedic aftercare: Secondary | ICD-10-CM | POA: Diagnosis not present

## 2012-11-21 DIAGNOSIS — IMO0001 Reserved for inherently not codable concepts without codable children: Secondary | ICD-10-CM | POA: Diagnosis not present

## 2012-11-23 DIAGNOSIS — IMO0001 Reserved for inherently not codable concepts without codable children: Secondary | ICD-10-CM | POA: Diagnosis not present

## 2012-11-23 DIAGNOSIS — Z4789 Encounter for other orthopedic aftercare: Secondary | ICD-10-CM | POA: Diagnosis not present

## 2012-11-23 DIAGNOSIS — F4310 Post-traumatic stress disorder, unspecified: Secondary | ICD-10-CM | POA: Diagnosis not present

## 2012-11-23 DIAGNOSIS — I10 Essential (primary) hypertension: Secondary | ICD-10-CM | POA: Diagnosis not present

## 2012-11-25 DIAGNOSIS — I10 Essential (primary) hypertension: Secondary | ICD-10-CM | POA: Diagnosis not present

## 2012-11-25 DIAGNOSIS — Z4789 Encounter for other orthopedic aftercare: Secondary | ICD-10-CM | POA: Diagnosis not present

## 2012-11-25 DIAGNOSIS — IMO0001 Reserved for inherently not codable concepts without codable children: Secondary | ICD-10-CM | POA: Diagnosis not present

## 2012-11-26 DIAGNOSIS — I10 Essential (primary) hypertension: Secondary | ICD-10-CM | POA: Diagnosis not present

## 2012-11-26 DIAGNOSIS — IMO0001 Reserved for inherently not codable concepts without codable children: Secondary | ICD-10-CM | POA: Diagnosis not present

## 2012-11-26 DIAGNOSIS — Z4789 Encounter for other orthopedic aftercare: Secondary | ICD-10-CM | POA: Diagnosis not present

## 2012-11-26 DIAGNOSIS — F4310 Post-traumatic stress disorder, unspecified: Secondary | ICD-10-CM | POA: Diagnosis not present

## 2012-12-01 DIAGNOSIS — F4310 Post-traumatic stress disorder, unspecified: Secondary | ICD-10-CM | POA: Diagnosis not present

## 2012-12-01 DIAGNOSIS — Z4789 Encounter for other orthopedic aftercare: Secondary | ICD-10-CM | POA: Diagnosis not present

## 2012-12-01 DIAGNOSIS — I10 Essential (primary) hypertension: Secondary | ICD-10-CM | POA: Diagnosis not present

## 2012-12-01 DIAGNOSIS — IMO0001 Reserved for inherently not codable concepts without codable children: Secondary | ICD-10-CM | POA: Diagnosis not present

## 2013-01-06 DIAGNOSIS — M4310 Spondylolisthesis, site unspecified: Secondary | ICD-10-CM | POA: Diagnosis not present

## 2013-03-24 ENCOUNTER — Other Ambulatory Visit: Payer: Self-pay | Admitting: Physician Assistant

## 2013-03-24 ENCOUNTER — Telehealth: Payer: Self-pay | Admitting: Physician Assistant

## 2013-03-24 ENCOUNTER — Other Ambulatory Visit: Payer: Medicare Other

## 2013-03-24 DIAGNOSIS — I10 Essential (primary) hypertension: Secondary | ICD-10-CM | POA: Diagnosis not present

## 2013-03-24 DIAGNOSIS — Z79899 Other long term (current) drug therapy: Secondary | ICD-10-CM

## 2013-03-24 DIAGNOSIS — R7989 Other specified abnormal findings of blood chemistry: Secondary | ICD-10-CM | POA: Diagnosis not present

## 2013-03-24 DIAGNOSIS — E785 Hyperlipidemia, unspecified: Secondary | ICD-10-CM | POA: Diagnosis not present

## 2013-03-24 DIAGNOSIS — E559 Vitamin D deficiency, unspecified: Secondary | ICD-10-CM | POA: Diagnosis not present

## 2013-03-24 LAB — COMPLETE METABOLIC PANEL WITH GFR
ALT: 12 U/L (ref 0–35)
AST: 15 U/L (ref 0–37)
Creat: 0.78 mg/dL (ref 0.50–1.10)
GFR, Est African American: >89 mL/min
Total Bilirubin: 0.4 mg/dL (ref 0.3–1.2)

## 2013-03-24 LAB — CBC WITH DIFFERENTIAL/PLATELET
Basophils Absolute: 0 10*3/uL (ref 0.0–0.1)
Basophils Relative: 1 % (ref 0–1)
HCT: 41.9 % (ref 36.0–46.0)
Hemoglobin: 14.2 g/dL (ref 12.0–15.0)
Lymphocytes Relative: 41 % (ref 12–46)
MCHC: 33.9 g/dL (ref 30.0–36.0)
Neutro Abs: 3.1 10*3/uL (ref 1.7–7.7)
Neutrophils Relative %: 45 % (ref 43–77)
RDW: 14.5 % (ref 11.5–15.5)
WBC: 6.8 10*3/uL (ref 4.0–10.5)

## 2013-03-24 LAB — LIPID PANEL
HDL: 38 mg/dL — ABNORMAL LOW (ref >39)
LDL Cholesterol: 164 mg/dL — ABNORMAL HIGH (ref 0–99)
Triglycerides: 137 mg/dL (ref <150)
VLDL: 27 mg/dL (ref 0–40)

## 2013-03-24 MED ORDER — ESOMEPRAZOLE MAGNESIUM 40 MG PO CPDR: 40.0000 mg | DELAYED_RELEASE_CAPSULE | Freq: Every day | ORAL | Status: DC

## 2013-03-24 NOTE — Telephone Encounter (Signed)
Pt due for OV  Has appt next week  One month refill to pharmacy

## 2013-03-25 LAB — VITAMIN D 25 HYDROXY (VIT D DEFICIENCY, FRACTURES): Vit D, 25-Hydroxy: 40 ng/mL (ref 30–89)

## 2013-03-25 LAB — HEMOGLOBIN A1C
Hgb A1c MFr Bld: 5.7 % — ABNORMAL HIGH (ref <5.7)
Mean Plasma Glucose: 117 mg/dL — ABNORMAL HIGH (ref <117)

## 2013-03-31 ENCOUNTER — Encounter: Payer: Self-pay | Admitting: Physician Assistant

## 2013-03-31 ENCOUNTER — Other Ambulatory Visit: Payer: Self-pay | Admitting: Physician Assistant

## 2013-03-31 ENCOUNTER — Ambulatory Visit (INDEPENDENT_AMBULATORY_CARE_PROVIDER_SITE_OTHER): Payer: Medicare Other | Admitting: Physician Assistant

## 2013-03-31 VITALS — BP 142/88 | HR 68 | Temp 98.2°F | Resp 20 | Ht 60.5 in | Wt 204.0 lb

## 2013-03-31 DIAGNOSIS — E559 Vitamin D deficiency, unspecified: Secondary | ICD-10-CM

## 2013-03-31 DIAGNOSIS — E785 Hyperlipidemia, unspecified: Secondary | ICD-10-CM

## 2013-03-31 DIAGNOSIS — R7309 Other abnormal glucose: Secondary | ICD-10-CM

## 2013-03-31 DIAGNOSIS — Z8249 Family history of ischemic heart disease and other diseases of the circulatory system: Secondary | ICD-10-CM | POA: Diagnosis not present

## 2013-03-31 DIAGNOSIS — R739 Hyperglycemia, unspecified: Secondary | ICD-10-CM

## 2013-03-31 DIAGNOSIS — K219 Gastro-esophageal reflux disease without esophagitis: Secondary | ICD-10-CM

## 2013-03-31 DIAGNOSIS — I10 Essential (primary) hypertension: Secondary | ICD-10-CM | POA: Diagnosis not present

## 2013-03-31 DIAGNOSIS — E669 Obesity, unspecified: Secondary | ICD-10-CM | POA: Insufficient documentation

## 2013-03-31 MED ORDER — BENAZEPRIL HCL 20 MG PO TABS: 20.0000 mg | ORAL_TABLET | Freq: Every day | ORAL | Status: DC

## 2013-03-31 MED ORDER — PRAVASTATIN SODIUM 40 MG PO TABS: 40.0000 mg | ORAL_TABLET | Freq: Every day | ORAL | Status: DC
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
## 2013-03-31 NOTE — Telephone Encounter (Signed)
Medication refilled per protocol. 

## 2013-04-09 DIAGNOSIS — Z1231 Encounter for screening mammogram for malignant neoplasm of breast: Secondary | ICD-10-CM | POA: Diagnosis not present

## 2013-04-09 DIAGNOSIS — Z803 Family history of malignant neoplasm of breast: Secondary | ICD-10-CM | POA: Diagnosis not present

## 2013-04-21 ENCOUNTER — Encounter: Payer: Self-pay | Admitting: Physician Assistant

## 2013-05-05 ENCOUNTER — Telehealth: Payer: Self-pay | Admitting: Physician Assistant

## 2013-05-05 NOTE — Telephone Encounter (Signed)
Pt called and made aware of provider recommendatins

## 2013-05-05 NOTE — Telephone Encounter (Signed)
Tell her to stop the pravastatin for 2 weeks and see if headache resolves or not. Call me in 2 weeks and let me know whether h/a resolved or not and then we will make further decision.

## 2013-05-12 ENCOUNTER — Other Ambulatory Visit: Payer: Medicare Other

## 2013-05-19 ENCOUNTER — Telehealth: Payer: Self-pay | Admitting: Physician Assistant

## 2013-05-19 NOTE — Telephone Encounter (Signed)
I recommend she try the pravastatin one more time to see if headache returns or not before we give up on pravastatin and mark it as intolerance. (she just started pravastatin recently for the first time. Soon after, she called c/o headache which she thought was sec to the pravachol. -stopped prav for 2 weeks-h/a resolved).  ? Coincidence vs adv effect of pravachol.  If she will not give this another try, then use Crestor 10mg  one po QD # 30/ one refill and recheck fasting FLP/LFT 6 weeks Or, restart Prav and call me if dev h/a again or if no adv effect, then f/u with fasting lab in 6 weeks

## 2013-05-19 NOTE — Telephone Encounter (Signed)
Pt has been off of pravastatin for 2 weeks now and the headaches have gone away. What should she do now? Also, she wants to know if she still needs to come back to get her labs done since she has been off of the medication.

## 2013-05-19 NOTE — Telephone Encounter (Signed)
Pt returned my call.  Will try to restart Pravastatin.  Understands to let us know immediately if headache returns.  Still need 6 week LFT/FLP after new start date.

## 2013-06-30 ENCOUNTER — Other Ambulatory Visit: Payer: Medicare Other

## 2013-06-30 DIAGNOSIS — E785 Hyperlipidemia, unspecified: Secondary | ICD-10-CM

## 2013-06-30 LAB — HEPATIC FUNCTION PANEL
ALT: 13 U/L (ref 0–35)
AST: 16 U/L (ref 0–37)
Alkaline Phosphatase: 88 U/L (ref 39–117)
Bilirubin, Direct: 0.2 mg/dL (ref 0.0–0.3)
Total Protein: 7.2 g/dL (ref 6.0–8.3)

## 2013-06-30 LAB — LIPID PANEL
Cholesterol: 178 mg/dL (ref 0–200)
HDL: 47 mg/dL (ref >39)
Total CHOL/HDL Ratio: 3.8 Ratio

## 2013-07-01 ENCOUNTER — Ambulatory Visit: Payer: Medicare Other | Admitting: Physician Assistant

## 2013-08-14 ENCOUNTER — Emergency Department (HOSPITAL_COMMUNITY)
Admission: EM | Admit: 2013-08-14 | Discharge: 2013-08-14 | Disposition: A | Discharge status: Home / Self Care | Payer: Medicare Other | Attending: Emergency Medicine | Admitting: Emergency Medicine

## 2013-08-14 ENCOUNTER — Encounter (HOSPITAL_COMMUNITY): Payer: Self-pay | Admitting: Emergency Medicine

## 2013-08-14 ENCOUNTER — Emergency Department (HOSPITAL_COMMUNITY): Payer: Medicare Other

## 2013-08-14 DIAGNOSIS — Z8659 Personal history of other mental and behavioral disorders: Secondary | ICD-10-CM | POA: Insufficient documentation

## 2013-08-14 DIAGNOSIS — E669 Obesity, unspecified: Secondary | ICD-10-CM | POA: Diagnosis not present

## 2013-08-14 DIAGNOSIS — G8929 Other chronic pain: Secondary | ICD-10-CM | POA: Insufficient documentation

## 2013-08-14 DIAGNOSIS — E785 Hyperlipidemia, unspecified: Secondary | ICD-10-CM | POA: Diagnosis not present

## 2013-08-14 DIAGNOSIS — Z79899 Other long term (current) drug therapy: Secondary | ICD-10-CM | POA: Diagnosis not present

## 2013-08-14 DIAGNOSIS — N3000 Acute cystitis without hematuria: Secondary | ICD-10-CM | POA: Diagnosis not present

## 2013-08-14 DIAGNOSIS — Z8669 Personal history of other diseases of the nervous system and sense organs: Secondary | ICD-10-CM | POA: Diagnosis not present

## 2013-08-14 DIAGNOSIS — I10 Essential (primary) hypertension: Secondary | ICD-10-CM | POA: Diagnosis not present

## 2013-08-14 DIAGNOSIS — R109 Unspecified abdominal pain: Secondary | ICD-10-CM | POA: Diagnosis not present

## 2013-08-14 DIAGNOSIS — N3090 Cystitis, unspecified without hematuria: Secondary | ICD-10-CM | POA: Diagnosis not present

## 2013-08-14 DIAGNOSIS — Z8719 Personal history of other diseases of the digestive system: Secondary | ICD-10-CM | POA: Insufficient documentation

## 2013-08-14 DIAGNOSIS — M545 Low back pain, unspecified: Secondary | ICD-10-CM | POA: Diagnosis not present

## 2013-08-14 DIAGNOSIS — R110 Nausea: Secondary | ICD-10-CM | POA: Diagnosis not present

## 2013-08-14 DIAGNOSIS — Z87891 Personal history of nicotine dependence: Secondary | ICD-10-CM | POA: Diagnosis not present

## 2013-08-14 DIAGNOSIS — Z95818 Presence of other cardiac implants and grafts: Secondary | ICD-10-CM | POA: Insufficient documentation

## 2013-08-14 DIAGNOSIS — Z8739 Personal history of other diseases of the musculoskeletal system and connective tissue: Secondary | ICD-10-CM | POA: Insufficient documentation

## 2013-08-14 HISTORY — DX: Dorsalgia, unspecified: M54.9

## 2013-08-14 HISTORY — DX: Other chronic pain: G89.29

## 2013-08-14 LAB — URINALYSIS, ROUTINE W REFLEX MICROSCOPIC
Bilirubin Urine: NEGATIVE
Glucose, UA: NEGATIVE mg/dL
Ketones, ur: NEGATIVE mg/dL
Protein, ur: NEGATIVE mg/dL
Urobilinogen, UA: 0.2 mg/dL (ref 0.0–1.0)

## 2013-08-14 LAB — URINE MICROSCOPIC-ADD ON

## 2013-08-14 MED ORDER — CEPHALEXIN 500 MG PO CAPS: 500.0000 mg | ORAL_CAPSULE | Freq: Four times a day (QID) | ORAL | Status: DC

## 2013-08-14 MED ORDER — OXYCODONE-ACETAMINOPHEN 5-325 MG PO TABS: ORAL_TABLET | ORAL | Status: DC

## 2013-08-14 MED ORDER — METHOCARBAMOL 500 MG PO TABS: 1000.0000 mg | ORAL_TABLET | Freq: Four times a day (QID) | ORAL | Status: DC | PRN

## 2013-08-14 MED ORDER — OXYCODONE-ACETAMINOPHEN 5-325 MG PO TABS
1.0000 | ORAL_TABLET | Freq: Once | ORAL | Status: AC
  Administered 2013-08-14: 1 via ORAL
  Filled 2013-08-14: qty 1

## 2013-08-14 NOTE — ED Notes (Signed)
Left flank pain since Monday.  Nauseated.
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
## 2013-08-15 LAB — URINE CULTURE: Culture: NO GROWTH

## 2013-08-16 ENCOUNTER — Ambulatory Visit (INDEPENDENT_AMBULATORY_CARE_PROVIDER_SITE_OTHER): Payer: Medicare Other | Admitting: Physician Assistant

## 2013-08-16 ENCOUNTER — Encounter: Payer: Self-pay | Admitting: Physician Assistant

## 2013-08-16 VITALS — BP 142/90 | HR 88 | Temp 98.3°F | Resp 20 | Wt 211.0 lb

## 2013-08-16 DIAGNOSIS — M545 Low back pain: Secondary | ICD-10-CM

## 2013-08-16 NOTE — Progress Notes (Signed)
Patient ID: Lynn [name] MRN: [id number], DOB: 02-11-1951, 62 y.o. Date of Encounter: 08/16/2013, 9:31 AM    Chief Complaint:  Chief Complaint  Patient presents with  . back pain    seen ED  Dx LBP/UTI     HPI: 62 y.o. year old white female is here for followup regarding left back pain.  She went to the emergency room regarding this on 08/14/13. At that time she reported that the pain had begun 5 days prior to that. She experienced waxing and waning left-sided flank pain. She described the pain as aching and radiating into the left side of her abdomen. She had associated nausea. Pain was worse with palpation of the area as well as with certain body positions.  UrineAnalysis was performed. It showed trace leukocytes. Trace blood. Everything else was negative. Culture was sent. Culture at this time is showing no growth so far.  Chest x-ray was normal.  CT of abdomen and pelvis was normal. No renal calculus or renal obstruction. L2-L3 L5 lumbar fusion with hardware as described in the report.  She was treated with Keflex 500 mg 4 times a day x10 days, methocarbamol 500 mg 2 4 times a day when necessary, oxycodone APAP 5/325 one to 2 every 6 hours as needed #20 with 0 refills.  Today she reports that her pain is almost completely resolved. Most days she is only needed one oxycodone per day. At most, she is taking 2 per day. Also taking Methocarbamol as needed. She has been taking the Keflex as directed. She says that even with no medication, she is having no to minimal pain. She says she took no oxycodone or methocarbamol this morning and is currently having absolutely no pain.     Home Meds: See attached medication section for any medications that were entered at today's visit. The computer does not put those onto this list.The following list is a list of meds entered prior to today's visit.   Current Outpatient Prescriptions on File Prior to Visit  Medication Sig Dispense  Refill  . benazepril (LOTENSIN) 20 MG tablet Take 20 mg by mouth daily.      . cephALEXin (KEFLEX) 500 MG capsule Take 1 capsule (500 mg total) by mouth 4 (four) times daily.  40 capsule  0  . esomeprazole (NEXIUM) 40 MG capsule Take 40 mg by mouth daily at 12 noon.      . methocarbamol (ROBAXIN) 500 MG tablet Take 2 tablets (1,000 mg total) by mouth 4 (four) times daily as needed for muscle spasms (muscle spasm/pain).  25 tablet  0  . oxyCODONE-acetaminophen (PERCOCET/ROXICET) 5-325 MG per tablet 1 or 2 tabs PO q6h prn pain  20 tablet  0  . pravastatin (PRAVACHOL) 40 MG tablet Take 40 mg by mouth at bedtime.      . Simethicone (GAS RELIEF) 125 MG CAPS Take 2 capsules by mouth every 4 (four) hours as needed (for gas relief).       No current facility-administered medications on file prior to visit.    Allergies:  Allergies  Allergen Reactions  . Diclofenac Sodium     REACTION: "throat closed up"  . Hydrocodone Itching and Nausea And Vomiting  . Lipitor [Atorvastatin Calcium] Other (See Comments)    Increased LFT's, myalgias  . Niacin     REACTION: flushing  . Other Other (See Comments)    Allergic to metal states body rejects it      Review of Systems: See  HPI for pertinent ROS. All other ROS negative.    Physical Exam: Blood pressure 142/90, pulse 88, temperature 98.3 F (36.8 C), temperature source Oral, resp. rate 20, weight 211 lb (95.709 kg)., Body mass index is 40.51 kg/(m^2). General: Overweight white female  Appears in no acute distress. Neck: Supple. No thyromegaly. No lymphadenopathy. Lungs: Clear bilaterally to auscultation without wheezes, rales, or rhonchi. Breathing is unlabored. Heart: Regular rhythm. No murmurs, rubs, or gallops. Abdomen: Soft, non-tender, non-distended with normoactive bowel sounds. No hepatomegaly. No rebound/guarding. No obvious abdominal masses. Msk:  Strength and tone normal for age. Extremities/Skin: Warm and dry. No clubbing or cyanosis.  No edema. No rashes or suspicious lesions. Neuro: Alert and oriented X 3. Moves all extremities spontaneously. Gait is normal. CNII-XII grossly in tact. Psych:  Responds to questions appropriately with a normal affect.     ASSESSMENT AND PLAN:  62 y.o. year old female with  1. Left low back pain See history of present illness for details. Pain consistent with musculoskeletal pain. Told her to use heat and stretch. Used to methocarbamol as needed and use the oxycodone and very sparingly only if she has increased pain. Her only symptoms are essentially resolved. Told her she can stop the Keflex.  She also mentions that she's been having a lot of pain in her left knee. Says that she saw [facility] in the past. Has had history of grafts and a significant history of this knee. She thinks that she has seen them within the past 2 years. I told her that she should  call them and schedule follow up with them herself. If their staff states that she needs a referral from [place],  she will call here and I will be happy to place order for the referral.   Signed, Frazier Richards, PA, [facility] 08/16/2013 9:31 AM

## 2014-04-07 ENCOUNTER — Encounter: Payer: Self-pay | Admitting: Family Medicine

## 2014-04-07 ENCOUNTER — Other Ambulatory Visit: Payer: Self-pay | Admitting: Physician Assistant

## 2014-04-07 NOTE — Telephone Encounter (Signed)
Medication refill for one time only.  Patient needs to be seen.  Letter sent for patient to call and schedule 

## 2014-04-11 DIAGNOSIS — Z803 Family history of malignant neoplasm of breast: Secondary | ICD-10-CM | POA: Diagnosis not present

## 2014-04-11 DIAGNOSIS — Z1231 Encounter for screening mammogram for malignant neoplasm of breast: Secondary | ICD-10-CM | POA: Diagnosis not present

## 2014-04-11 LAB — HM MAMMOGRAPHY: HM Mammogram: NORMAL

## 2014-04-14 ENCOUNTER — Encounter: Payer: Self-pay | Admitting: Physician Assistant

## 2014-04-14 ENCOUNTER — Ambulatory Visit (INDEPENDENT_AMBULATORY_CARE_PROVIDER_SITE_OTHER): Payer: Medicare Other | Admitting: Physician Assistant

## 2014-04-14 VITALS — BP 148/92 | HR 72 | Temp 98.1°F | Resp 16 | Ht 61.0 in | Wt 225.0 lb

## 2014-04-14 DIAGNOSIS — Z8249 Family history of ischemic heart disease and other diseases of the circulatory system: Secondary | ICD-10-CM

## 2014-04-14 DIAGNOSIS — E785 Hyperlipidemia, unspecified: Secondary | ICD-10-CM

## 2014-04-14 DIAGNOSIS — Z23 Encounter for immunization: Secondary | ICD-10-CM

## 2014-04-14 DIAGNOSIS — R739 Hyperglycemia, unspecified: Secondary | ICD-10-CM

## 2014-04-14 DIAGNOSIS — R7309 Other abnormal glucose: Secondary | ICD-10-CM

## 2014-04-14 DIAGNOSIS — I10 Essential (primary) hypertension: Secondary | ICD-10-CM

## 2014-04-14 DIAGNOSIS — E669 Obesity, unspecified: Secondary | ICD-10-CM | POA: Diagnosis not present

## 2014-04-14 DIAGNOSIS — E559 Vitamin D deficiency, unspecified: Secondary | ICD-10-CM | POA: Diagnosis not present

## 2014-04-14 LAB — COMPLETE METABOLIC PANEL WITH GFR
ALBUMIN: 4.3 g/dL (ref 3.5–5.2)
ALT: 24 U/L (ref 0–35)
AST: 28 U/L (ref 0–37)
Alkaline Phosphatase: 87 U/L (ref 39–117)
BUN: 20 mg/dL (ref 6–23)
CALCIUM: 9.6 mg/dL (ref 8.4–10.5)
CHLORIDE: 107 meq/L (ref 96–112)
CO2: 29 meq/L (ref 19–32)
Creat: 0.76 mg/dL (ref 0.50–1.10)
GFR, EST NON AFRICAN AMERICAN: 86 mL/min
GFR, Est African American: >89 mL/min
GLUCOSE: 105 mg/dL — AB (ref 70–99)
Potassium: 4.9 mEq/L (ref 3.5–5.3)
Sodium: 144 mEq/L (ref 135–145)
Total Bilirubin: 0.4 mg/dL (ref 0.2–1.2)
Total Protein: 7.2 g/dL (ref 6.0–8.3)

## 2014-04-14 LAB — LIPID PANEL
Cholesterol: 206 mg/dL — ABNORMAL HIGH (ref 0–200)
HDL: 49 mg/dL (ref >39)
LDL CALC: 124 mg/dL — AB (ref 0–99)
Total CHOL/HDL Ratio: 4.2 Ratio
Triglycerides: 167 mg/dL — ABNORMAL HIGH (ref <150)
VLDL: 33 mg/dL (ref 0–40)

## 2014-04-14 LAB — HEMOGLOBIN A1C
HEMOGLOBIN A1C: 6 % — AB (ref <5.7)
MEAN PLASMA GLUCOSE: 126 mg/dL — AB (ref <117)

## 2014-04-14 MED ORDER — CLONIDINE HCL 0.1 MG PO TABS
0.1000 mg | ORAL_TABLET | Freq: Once | ORAL | Status: AC
  Administered 2014-04-14: 0.1 mg via ORAL

## 2014-04-14 MED ORDER — AMLODIPINE BESYLATE 10 MG PO TABS: 10.0000 mg | ORAL_TABLET | Freq: Every day | ORAL | Status: DC
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
## 2014-04-15 LAB — VITAMIN D 25 HYDROXY (VIT D DEFICIENCY, FRACTURES): VIT D 25 HYDROXY: 26 ng/mL — AB (ref 30–89)

## 2014-04-18 ENCOUNTER — Other Ambulatory Visit: Payer: Self-pay | Admitting: Physician Assistant

## 2014-04-18 MED ORDER — PRAVASTATIN SODIUM 80 MG PO TABS: 80.0000 mg | ORAL_TABLET | Freq: Every day | ORAL | Status: DC

## 2014-04-26 ENCOUNTER — Other Ambulatory Visit: Payer: Self-pay | Admitting: Physician Assistant

## 2014-04-26 NOTE — Telephone Encounter (Signed)
Refill appropriate and filled per protocol. 

## 2014-04-28 ENCOUNTER — Encounter: Payer: Self-pay | Admitting: Physician Assistant

## 2014-04-28 ENCOUNTER — Ambulatory Visit (INDEPENDENT_AMBULATORY_CARE_PROVIDER_SITE_OTHER): Payer: Medicare Other | Admitting: Physician Assistant

## 2014-04-28 VITALS — BP 132/70 | HR 82 | Temp 98.7°F | Resp 16 | Ht 61.0 in | Wt 223.0 lb

## 2014-04-28 DIAGNOSIS — I10 Essential (primary) hypertension: Secondary | ICD-10-CM

## 2014-04-28 MED ORDER — AMLODIPINE BESYLATE 10 MG PO TABS: 10.0000 mg | ORAL_TABLET | Freq: Every day | ORAL | Status: DC
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
## 2014-05-14 ENCOUNTER — Other Ambulatory Visit: Payer: Self-pay | Admitting: Physician Assistant

## 2014-05-14 NOTE — Telephone Encounter (Signed)
Refill appropriate and filled per protocol. 

## 2014-06-03 ENCOUNTER — Encounter: Payer: Self-pay | Admitting: Internal Medicine

## 2014-06-24 ENCOUNTER — Encounter: Payer: Self-pay | Admitting: Family Medicine

## 2014-08-25 ENCOUNTER — Encounter: Payer: Self-pay | Admitting: Physician Assistant

## 2014-08-25 ENCOUNTER — Ambulatory Visit (INDEPENDENT_AMBULATORY_CARE_PROVIDER_SITE_OTHER): Payer: Medicare Other | Admitting: Physician Assistant

## 2014-08-25 VITALS — BP 128/86 | HR 96 | Temp 98.5°F | Resp 20 | Wt 229.0 lb

## 2014-08-25 DIAGNOSIS — J040 Acute laryngitis: Secondary | ICD-10-CM

## 2014-08-25 DIAGNOSIS — B9789 Other viral agents as the cause of diseases classified elsewhere: Secondary | ICD-10-CM

## 2014-08-25 NOTE — Progress Notes (Signed)
Patient ID: Lynn [name] MRN: [id number], DOB: October 11, 1950, 63 y.o. Date of Encounter: 08/25/2014, 2:57 PM    Chief Complaint:  Chief Complaint  Patient presents with  . sick x 2 days    loss of voice, painful on left side of neck     HPI: 62 y.o. year old white female says the symptoms just started yesterday. Says that she is having hoarseness. Occasionally off and on she feels discomfort that was up towards her left ear and now sometimes feels this towards her left neck. Says it is not there all the time but occasionally feels it. Her throat really isn't sore-- just hoarse. She has had no fevers and no chills. No mucus from her nose and no cough or chest congestion.     Home Meds:   Outpatient Prescriptions Prior to Visit  Medication Sig Dispense Refill  . amLODipine (NORVASC) 10 MG tablet Take 1 tablet (10 mg total) by mouth daily. 30 tablet 11  . benazepril (LOTENSIN) 20 MG tablet TAKE 1 TABLET (20 MG TOTAL) BY MOUTH DAILY. 30 tablet 36  . methocarbamol (ROBAXIN) 500 MG tablet Take 2 tablets (1,000 mg total) by mouth 4 (four) times daily as needed for muscle spasms (muscle spasm/pain). 25 tablet 0  . NEXIUM 40 MG capsule TAKE ONE CAPSULE BY MOUTH EVERY DAY BEFORE BREAKFAST 30 capsule 10  . pravastatin (PRAVACHOL) 80 MG tablet Take 1 tablet (80 mg total) by mouth daily. 30 tablet 3  . Simethicone (GAS RELIEF) 125 MG CAPS Take 2 capsules by mouth every 4 (four) hours as needed (for gas relief).    Marland Kitchen oxyCODONE-acetaminophen (PERCOCET/ROXICET) 5-325 MG per tablet 1 or 2 tabs PO q6h prn pain (Patient not taking: Reported on 08/25/2014) 20 tablet 0   No facility-administered medications prior to visit.    Allergies:  Allergies  Allergen Reactions  . Diclofenac Sodium     REACTION: "throat closed up"  . Hydrocodone Itching and Nausea And Vomiting  . Lipitor [Atorvastatin Calcium] Other (See Comments)    Increased LFT's, myalgias  . Niacin     REACTION: flushing  .  Other Other (See Comments)    Allergic to metal states body rejects it      Review of Systems: See HPI for pertinent ROS. All other ROS negative.    Physical Exam: Blood pressure 128/86, pulse 96, temperature 98.5 F (36.9 C), temperature source Oral, resp. rate 20, weight 229 lb (103.874 kg)., Body mass index is 43.29 kg/(m^2). General:  Obese white female . Appears in no acute distress. HEENT: Normocephalic, atraumatic, eyes without discharge, sclera non-icteric, nares are without discharge. Bilateral auditory canals clear, TM's are without perforation, pearly grey and translucent with reflective cone of light bilaterally. Oral cavity moist, posterior pharynx without exudate or peritonsillar abscess. Minimal erythema of posterior pharynx bilaterally.  Neck: Supple. No thyromegaly. No lymphadenopathy. He reports reports that there is very minimal tenderness with palpation of the left tonsillar node. This is very minimal. There is no enlargement of the lymph nodes. Lungs: Clear bilaterally to auscultation without wheezes, rales, or rhonchi. Breathing is unlabored. Heart: Regular rhythm. No murmurs, rubs, or gallops. Msk:  Strength and tone normal for age. Extremities/Skin: Warm and dry. Neuro: Alert and oriented X 3. Moves all extremities spontaneously. Gait is normal. CNII-XII grossly in tact. Psych:  Responds to questions appropriately with a normal affect.     ASSESSMENT AND PLAN:  63 y.o. year old female with  1. Viral laryngitis  Voice rest. Symptomatic treatment. Follow up if she does develop fever or symptoms worsen significantly or if they persist greater than 7-10 days.   [place], [place], [facility] 08/25/2014 2:57 PM

## 2014-08-31 ENCOUNTER — Other Ambulatory Visit: Payer: Self-pay | Admitting: Physician Assistant

## 2014-08-31 NOTE — Telephone Encounter (Signed)
Medication refilled per protocol. 

## 2015-01-11 ENCOUNTER — Encounter: Payer: Self-pay | Admitting: Family Medicine

## 2015-01-18 ENCOUNTER — Ambulatory Visit (INDEPENDENT_AMBULATORY_CARE_PROVIDER_SITE_OTHER): Payer: Medicare Other | Admitting: Physician Assistant

## 2015-01-18 ENCOUNTER — Telehealth: Payer: Self-pay | Admitting: Physician Assistant

## 2015-01-18 ENCOUNTER — Encounter: Payer: Self-pay | Admitting: Physician Assistant

## 2015-01-18 VITALS — BP 136/90 | HR 88 | Temp 98.3°F | Resp 18 | Wt 234.0 lb

## 2015-01-18 DIAGNOSIS — R739 Hyperglycemia, unspecified: Secondary | ICD-10-CM

## 2015-01-18 DIAGNOSIS — E669 Obesity, unspecified: Secondary | ICD-10-CM | POA: Diagnosis not present

## 2015-01-18 DIAGNOSIS — Z8249 Family history of ischemic heart disease and other diseases of the circulatory system: Secondary | ICD-10-CM | POA: Diagnosis not present

## 2015-01-18 DIAGNOSIS — R7309 Other abnormal glucose: Secondary | ICD-10-CM | POA: Diagnosis not present

## 2015-01-18 DIAGNOSIS — E559 Vitamin D deficiency, unspecified: Secondary | ICD-10-CM

## 2015-01-18 DIAGNOSIS — E785 Hyperlipidemia, unspecified: Secondary | ICD-10-CM

## 2015-01-18 DIAGNOSIS — I10 Essential (primary) hypertension: Secondary | ICD-10-CM

## 2015-01-18 DIAGNOSIS — G4700 Insomnia, unspecified: Secondary | ICD-10-CM | POA: Diagnosis not present

## 2015-01-18 LAB — COMPLETE METABOLIC PANEL WITH GFR
ALT: 10 U/L (ref 0–35)
AST: 14 U/L (ref 0–37)
Albumin: 4.3 g/dL (ref 3.5–5.2)
Alkaline Phosphatase: 80 U/L (ref 39–117)
BUN: 15 mg/dL (ref 6–23)
CO2: 25 meq/L (ref 19–32)
Calcium: 9.4 mg/dL (ref 8.4–10.5)
Chloride: 105 mEq/L (ref 96–112)
Creat: 0.75 mg/dL (ref 0.50–1.10)
GFR, Est Non African American: 86 mL/min
GLUCOSE: 101 mg/dL — AB (ref 70–99)
POTASSIUM: 4.2 meq/L (ref 3.5–5.3)
SODIUM: 141 meq/L (ref 135–145)
Total Bilirubin: 0.5 mg/dL (ref 0.2–1.2)
Total Protein: 7.3 g/dL (ref 6.0–8.3)

## 2015-01-18 LAB — LIPID PANEL
Cholesterol: 153 mg/dL (ref 0–200)
HDL: 40 mg/dL — ABNORMAL LOW (ref >=46)
LDL CALC: 80 mg/dL (ref 0–99)
TRIGLYCERIDES: 164 mg/dL — AB (ref <150)
Total CHOL/HDL Ratio: 3.8 Ratio
VLDL: 33 mg/dL (ref 0–40)

## 2015-01-18 MED ORDER — TRAZODONE HCL 50 MG PO TABS: 25.0000 mg | ORAL_TABLET | Freq: Every evening | ORAL | Status: DC | PRN

## 2015-01-18 NOTE — Telephone Encounter (Signed)
Called patient back and told her to call to call her Part D provider.  They probably do cover if she has at a pharmacy.  She is going to cal them and let me know

## 2015-01-18 NOTE — Telephone Encounter (Signed)
[phone]  Patient is calling to let you know that medicare does not cover the shingles shot, Lynn [name] had requested her to call back and let us know
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
## 2015-01-18 NOTE — Telephone Encounter (Signed)
Pt is covered for Zostavax if has at pharmacy under her Part D.  Called order into [facility] for her.

## 2015-01-19 LAB — HEMOGLOBIN A1C
HEMOGLOBIN A1C: 6.3 % — AB (ref <5.7)
Mean Plasma Glucose: 134 mg/dL — ABNORMAL HIGH (ref <117)

## 2015-01-19 LAB — VITAMIN D 25 HYDROXY (VIT D DEFICIENCY, FRACTURES): Vit D, 25-Hydroxy: 12 ng/mL — ABNORMAL LOW (ref 30–100)

## 2015-01-24 ENCOUNTER — Telehealth: Payer: Self-pay | Admitting: Family Medicine

## 2015-01-24 ENCOUNTER — Other Ambulatory Visit: Payer: Self-pay | Admitting: Physician Assistant

## 2015-01-24 MED ORDER — VITAMIN D (ERGOCALCIFEROL) 1.25 MG (50000 UNIT) PO CAPS: 50000.0000 [IU] | ORAL_CAPSULE | ORAL | Status: DC

## 2015-01-24 NOTE — Telephone Encounter (Signed)
-----   Message from Orlena Sheldon, PA-C sent at 01/19/2015  7:37 AM EDT ----- Vitamin D level is very low. Needs to take prescription strength vitamin D but then when she completes this needs to MAKE SURE to take the over-the-counter vitamin D. Send prescription for Ergocalciferol 50,000 units every week 12 weeks.  When completes this prescription strength MAKE SURE to take over-the-counter vitamin D 4000 IUs daily Cholesterol is good with current medication. Continue the pravastatin 80 mg the same. A1c is creeping up. Continue to follow the low carbohydrate diet to try to keep this controlled without medication. Also, need to walk for 30 minutes daily.

## 2015-01-24 NOTE — Telephone Encounter (Signed)
Pt aware of lab results and provider recommendations 

## 2015-01-24 NOTE — Telephone Encounter (Signed)
Refill appropriate and filled per protocol. 

## 2015-04-05 ENCOUNTER — Encounter: Payer: Self-pay | Admitting: Physician Assistant

## 2015-04-05 ENCOUNTER — Ambulatory Visit (INDEPENDENT_AMBULATORY_CARE_PROVIDER_SITE_OTHER): Payer: Medicare Other | Admitting: Physician Assistant

## 2015-04-05 VITALS — BP 130/82 | HR 98 | Temp 98.2°F | Resp 18 | Wt 232.5 lb

## 2015-04-05 DIAGNOSIS — E785 Hyperlipidemia, unspecified: Secondary | ICD-10-CM

## 2015-04-05 DIAGNOSIS — Z0000 Encounter for general adult medical examination without abnormal findings: Secondary | ICD-10-CM | POA: Diagnosis not present

## 2015-04-05 DIAGNOSIS — G4700 Insomnia, unspecified: Secondary | ICD-10-CM

## 2015-04-05 DIAGNOSIS — E669 Obesity, unspecified: Secondary | ICD-10-CM | POA: Diagnosis not present

## 2015-04-05 DIAGNOSIS — R739 Hyperglycemia, unspecified: Secondary | ICD-10-CM

## 2015-04-05 DIAGNOSIS — I10 Essential (primary) hypertension: Secondary | ICD-10-CM | POA: Diagnosis not present

## 2015-04-05 DIAGNOSIS — E559 Vitamin D deficiency, unspecified: Secondary | ICD-10-CM

## 2015-04-05 DIAGNOSIS — Z8249 Family history of ischemic heart disease and other diseases of the circulatory system: Secondary | ICD-10-CM | POA: Diagnosis not present

## 2015-04-05 MED ORDER — ZOLPIDEM TARTRATE 10 MG PO TABS: 10.0000 mg | ORAL_TABLET | Freq: Every evening | ORAL | Status: DC | PRN
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
## 2015-04-08 LAB — PAP, THIN PREP W/HPV RFLX HPV TYPE 16/18: HPV DNA High Risk: NOT DETECTED

## 2015-04-10 ENCOUNTER — Encounter: Payer: Self-pay | Admitting: Family Medicine

## 2015-04-17 DIAGNOSIS — Z1231 Encounter for screening mammogram for malignant neoplasm of breast: Secondary | ICD-10-CM | POA: Diagnosis not present

## 2015-04-17 LAB — HM MAMMOGRAPHY: HM MAMMO: NORMAL

## 2015-04-18 ENCOUNTER — Telehealth: Payer: Self-pay | Admitting: Family Medicine

## 2015-04-18 NOTE — Telephone Encounter (Signed)
Said Zolpidem worked well for a couple of nights and now past two nights has awoke with severe headache.  Is asking for something for her her "nerves"  Please advise?

## 2015-04-19 ENCOUNTER — Other Ambulatory Visit: Payer: Self-pay | Admitting: Physician Assistant

## 2015-04-19 NOTE — Telephone Encounter (Signed)
I called patient.  She now says she does not want anything to help her sleep.  Fearful of sleep meds due to problems in the past with others.  If insomnia continues let us know.

## 2015-04-19 NOTE — Telephone Encounter (Signed)
I reviewed my LOV note.  Tell her let's try Belsomra. See if she can come pick up info to get free trial offer, and savings card.  Rx Belsomra 15mg  1 po QHS # 30 + 0

## 2015-04-19 NOTE — Telephone Encounter (Signed)
Medication refilled per protocol. 

## 2015-04-24 ENCOUNTER — Encounter: Payer: Self-pay | Admitting: Family Medicine

## 2015-05-02 ENCOUNTER — Telehealth: Payer: Self-pay | Admitting: Family Medicine

## 2015-05-02 DIAGNOSIS — K2100 Gastro-esophageal reflux disease with esophagitis, without bleeding: Secondary | ICD-10-CM

## 2015-05-02 NOTE — Telephone Encounter (Signed)
rec'd notice from [employer] about Nexium.  Have call into patient to answer some questions about use.

## 2015-05-02 NOTE — Telephone Encounter (Signed)
Spoke to patient.  Hx of GERD w/gastric ulcer.  Has been on Nexium >5 years with good relief and no problems noted.  Response form returned to insurance.  Nexium to be continued daily as ordered.

## 2015-05-20 ENCOUNTER — Other Ambulatory Visit: Payer: Self-pay | Admitting: Physician Assistant

## 2015-05-23 NOTE — Telephone Encounter (Signed)
Medication refilled per protocol. 

## 2015-05-24 ENCOUNTER — Other Ambulatory Visit: Payer: Self-pay | Admitting: Physician Assistant

## 2015-05-24 NOTE — Telephone Encounter (Signed)
Medication refilled per protocol. 

## 2015-05-27 ENCOUNTER — Other Ambulatory Visit: Payer: Self-pay | Admitting: Family Medicine

## 2015-05-28 ENCOUNTER — Other Ambulatory Visit: Payer: Self-pay | Admitting: Physician Assistant

## 2015-05-29 NOTE — Telephone Encounter (Signed)
Medication refilled per protocol. 

## 2015-07-26 ENCOUNTER — Ambulatory Visit (INDEPENDENT_AMBULATORY_CARE_PROVIDER_SITE_OTHER): Payer: Medicare Other | Admitting: Physician Assistant

## 2015-07-26 ENCOUNTER — Encounter: Payer: Self-pay | Admitting: Physician Assistant

## 2015-07-26 VITALS — BP 128/86 | HR 100 | Temp 98.3°F | Resp 20 | Wt 232.0 lb

## 2015-07-26 DIAGNOSIS — J019 Acute sinusitis, unspecified: Secondary | ICD-10-CM | POA: Diagnosis not present

## 2015-07-26 DIAGNOSIS — J988 Other specified respiratory disorders: Secondary | ICD-10-CM | POA: Diagnosis not present

## 2015-07-26 DIAGNOSIS — B9689 Other specified bacterial agents as the cause of diseases classified elsewhere: Principal | ICD-10-CM

## 2015-07-26 MED ORDER — AZITHROMYCIN 250 MG PO TABS: ORAL_TABLET | ORAL | Status: DC

## 2015-07-26 MED ORDER — PREDNISONE 20 MG PO TABS: ORAL_TABLET | ORAL | Status: DC

## 2015-07-26 MED ORDER — HYDROCODONE-HOMATROPINE 5-1.5 MG/5ML PO SYRP: 5.0000 mL | ORAL_SOLUTION | Freq: Three times a day (TID) | ORAL | Status: DC | PRN

## 2015-07-26 NOTE — Progress Notes (Signed)
Patient ID: Lynn [name] MRN: [id number], DOB: [birth year], 64 y.o. Date of Encounter: 07/26/2015, 1:29 PM    Chief Complaint:  Chief Complaint  Patient presents with  . sick 2 1/2 wk    chills, gagging dry cough  otc not helping, pounding HA     HPI: 64 y.o. year old white female visit she has been sick for 2-1/2 weeks. Says that in the beginning of the illness she had chills for several days. Develops sneezing and runny nose and now also has cough. Says that when she wakes in the morning she has a lot of pressure and headache. Also a lot of cough. No significant sore throat or earache. No fevers.     Home Meds:   Outpatient Prescriptions Prior to Visit  Medication Sig Dispense Refill  . amLODipine (NORVASC) 10 MG tablet TAKE 1 TABLET (10 MG TOTAL) BY MOUTH DAILY. 90 tablet 1  . benazepril (LOTENSIN) 20 MG tablet TAKE 1 TABLET (20 MG TOTAL) BY MOUTH DAILY. 90 tablet 1  . NEXIUM 40 MG capsule TAKE ONE CAPSULE IN THE MORNING 90 capsule 3  . pravastatin (PRAVACHOL) 80 MG tablet TAKE 1 TABLET (80 MG TOTAL) BY MOUTH DAILY. 90 tablet 1  . Simethicone (GAS RELIEF) 125 MG CAPS Take 2 capsules by mouth every 4 (four) hours as needed (for gas relief).    . Vitamin D, Ergocalciferol, (DRISDOL) 50000 UNITS CAPS capsule Take 1 capsule (50,000 Units total) by mouth every 7 (seven) days. 12 capsule 0   No facility-administered medications prior to visit.    Allergies:  Allergies  Allergen Reactions  . Diclofenac Sodium     REACTION: "throat closed up"  . Hydrocodone Itching and Nausea And Vomiting  . Lipitor [Atorvastatin Calcium] Other (See Comments)    Increased LFT's, myalgias  . Niacin     REACTION: flushing  . Other Other (See Comments)    Allergic to metal states body rejects it  . Zolpidem Other (See Comments)    Severe headache      Review of Systems: See HPI for pertinent ROS. All other ROS negative.    Physical Exam: Blood pressure 128/86, pulse 100, temperature  98.3 F (36.8 C), temperature source Oral, resp. rate 20, weight 232 lb (105.235 kg)., Body mass index is 43.86 kg/(m^2). General:  WNWD WF Appears in no acute distress. HEENT: Normocephalic, atraumatic, eyes without discharge, sclera non-icteric, nares are without discharge. Bilateral auditory canals clear, TM's are without perforation, pearly grey and translucent with reflective cone of light bilaterally. Oral cavity moist, posterior pharynx without exudate, erythema, peritonsillar abscess.  Neck: Supple. No thyromegaly. No lymphadenopathy. Lungs: Clear bilaterally to auscultation without wheezes, rales, or rhonchi. Breathing is unlabored. Heart: Regular rhythm. No murmurs, rubs, or gallops. Msk:  Strength and tone normal for age. Extremities/Skin: Warm and dry. Neuro: Alert and oriented X 3. Moves all extremities spontaneously. Gait is normal. CNII-XII grossly in tact. Psych:  Responds to questions appropriately with a normal affect.     ASSESSMENT AND PLAN:  64 y.o. year old female with  1. Bacterial respiratory infection - azithromycin (ZITHROMAX) 250 MG tablet; Day 1: Take 2 daily. Days 2-5: Take 1 daily.  Dispense: 6 tablet; Refill: 0 - HYDROcodone-homatropine (HYCODAN) 5-1.5 MG/5ML syrup; Take 5 mLs by mouth every 8 (eight) hours as needed for cough.  Dispense: 120 mL; Refill: 0  2. Acute sinusitis, recurrence not specified, unspecified location - azithromycin (ZITHROMAX) 250 MG tablet; Day 1: Take 2 daily.  Days 2-5: Take 1 daily.  Dispense: 6 tablet; Refill: 0 - predniSONE (DELTASONE) 20 MG tablet; Take 3 daily for 2 days, then 2 daily for 2 days, then 1 daily for 2 days.  Dispense: 12 tablet; Refill: 0  She is to take the antibiotic and prednisone as directed. She can use the cough suppressant as needed. Follow-up if symptoms do not resolve within 1 week after completion of antibiotic and prednisone.  Marin Olp [place], [place], [facility] 07/26/2015 1:29 PM

## 2015-08-04 DIAGNOSIS — H40053 Ocular hypertension, bilateral: Secondary | ICD-10-CM | POA: Diagnosis not present

## 2015-08-04 DIAGNOSIS — H2513 Age-related nuclear cataract, bilateral: Secondary | ICD-10-CM | POA: Diagnosis not present

## 2015-10-04 ENCOUNTER — Other Ambulatory Visit: Payer: Medicare Other

## 2015-10-04 ENCOUNTER — Other Ambulatory Visit: Payer: Self-pay | Admitting: Physician Assistant

## 2015-10-04 ENCOUNTER — Other Ambulatory Visit: Payer: Self-pay | Admitting: Family Medicine

## 2015-10-04 DIAGNOSIS — I10 Essential (primary) hypertension: Secondary | ICD-10-CM | POA: Diagnosis not present

## 2015-10-04 DIAGNOSIS — Z79899 Other long term (current) drug therapy: Secondary | ICD-10-CM

## 2015-10-04 DIAGNOSIS — R739 Hyperglycemia, unspecified: Secondary | ICD-10-CM

## 2015-10-04 DIAGNOSIS — E559 Vitamin D deficiency, unspecified: Secondary | ICD-10-CM

## 2015-10-04 DIAGNOSIS — E039 Hypothyroidism, unspecified: Secondary | ICD-10-CM | POA: Diagnosis not present

## 2015-10-04 DIAGNOSIS — E785 Hyperlipidemia, unspecified: Secondary | ICD-10-CM | POA: Diagnosis not present

## 2015-10-04 DIAGNOSIS — E669 Obesity, unspecified: Secondary | ICD-10-CM

## 2015-10-04 DIAGNOSIS — R7309 Other abnormal glucose: Secondary | ICD-10-CM | POA: Diagnosis not present

## 2015-10-04 LAB — CBC WITH DIFFERENTIAL/PLATELET
BASOS PCT: 1 % (ref 0–1)
Basophils Absolute: 0.1 10*3/uL (ref 0.0–0.1)
EOS PCT: 3 % (ref 0–5)
Eosinophils Absolute: 0.3 10*3/uL (ref 0.0–0.7)
HCT: 47.8 % — ABNORMAL HIGH (ref 36.0–46.0)
Hemoglobin: 15.7 g/dL — ABNORMAL HIGH (ref 12.0–15.0)
Lymphocytes Relative: 36 % (ref 12–46)
Lymphs Abs: 3 10*3/uL (ref 0.7–4.0)
MCH: 30.1 pg (ref 26.0–34.0)
MCHC: 32.8 g/dL (ref 30.0–36.0)
MCV: 91.7 fL (ref 78.0–100.0)
MONO ABS: 0.8 10*3/uL (ref 0.1–1.0)
MPV: 11.1 fL (ref 8.6–12.4)
Monocytes Relative: 10 % (ref 3–12)
NEUTROS ABS: 4.2 10*3/uL (ref 1.7–7.7)
Neutrophils Relative %: 50 % (ref 43–77)
PLATELETS: 257 10*3/uL (ref 150–400)
RBC: 5.21 MIL/uL — AB (ref 3.87–5.11)
RDW: 14.1 % (ref 11.5–15.5)
WBC: 8.4 10*3/uL (ref 4.0–10.5)

## 2015-10-04 LAB — COMPLETE METABOLIC PANEL WITH GFR
ALT: 16 U/L (ref 6–29)
AST: 20 U/L (ref 10–35)
Albumin: 4.1 g/dL (ref 3.6–5.1)
Alkaline Phosphatase: 84 U/L (ref 33–130)
BUN: 14 mg/dL (ref 7–25)
CALCIUM: 9.5 mg/dL (ref 8.6–10.4)
CHLORIDE: 104 mmol/L (ref 98–110)
CO2: 30 mmol/L (ref 20–31)
CREATININE: 0.7 mg/dL (ref 0.50–0.99)
GFR, Est Non African American: >89 mL/min (ref >=60)
Glucose, Bld: 100 mg/dL — ABNORMAL HIGH (ref 70–99)
POTASSIUM: 4.3 mmol/L (ref 3.5–5.3)
Sodium: 143 mmol/L (ref 135–146)
Total Bilirubin: 0.6 mg/dL (ref 0.2–1.2)
Total Protein: 7 g/dL (ref 6.1–8.1)

## 2015-10-04 LAB — LIPID PANEL
CHOL/HDL RATIO: 4.7 ratio (ref <=5.0)
CHOLESTEROL: 179 mg/dL (ref 125–200)
HDL: 38 mg/dL — AB (ref >=46)
LDL Cholesterol: 98 mg/dL (ref <130)
Triglycerides: 213 mg/dL — ABNORMAL HIGH (ref <150)
VLDL: 43 mg/dL — ABNORMAL HIGH (ref <30)

## 2015-10-04 LAB — TSH: TSH: 4.928 u[IU]/mL — AB (ref 0.350–4.500)

## 2015-10-05 LAB — VITAMIN D 25 HYDROXY (VIT D DEFICIENCY, FRACTURES): VIT D 25 HYDROXY: 46 ng/mL (ref 30–100)

## 2015-10-05 LAB — HEMOGLOBIN A1C
HEMOGLOBIN A1C: 6.4 % — AB (ref <5.7)
Mean Plasma Glucose: 137 mg/dL — ABNORMAL HIGH (ref <117)

## 2015-10-06 LAB — T4, FREE: FREE T4: 0.94 ng/dL (ref 0.80–1.80)

## 2015-10-09 ENCOUNTER — Encounter: Payer: Self-pay | Admitting: Physician Assistant

## 2015-10-09 ENCOUNTER — Ambulatory Visit (INDEPENDENT_AMBULATORY_CARE_PROVIDER_SITE_OTHER): Payer: Medicare Other | Admitting: Physician Assistant

## 2015-10-09 ENCOUNTER — Other Ambulatory Visit (INDEPENDENT_AMBULATORY_CARE_PROVIDER_SITE_OTHER): Payer: Medicare Other | Admitting: Physician Assistant

## 2015-10-09 VITALS — BP 120/82 | HR 88 | Temp 98.3°F | Resp 18 | Wt 237.0 lb

## 2015-10-09 DIAGNOSIS — F411 Generalized anxiety disorder: Secondary | ICD-10-CM | POA: Diagnosis not present

## 2015-10-09 DIAGNOSIS — Z23 Encounter for immunization: Secondary | ICD-10-CM | POA: Diagnosis not present

## 2015-10-09 DIAGNOSIS — E038 Other specified hypothyroidism: Secondary | ICD-10-CM

## 2015-10-09 DIAGNOSIS — F329 Major depressive disorder, single episode, unspecified: Secondary | ICD-10-CM

## 2015-10-09 DIAGNOSIS — E039 Hypothyroidism, unspecified: Secondary | ICD-10-CM

## 2015-10-09 DIAGNOSIS — F32A Depression, unspecified: Secondary | ICD-10-CM | POA: Insufficient documentation

## 2015-10-09 MED ORDER — PAROXETINE HCL 20 MG PO TABS: 20.0000 mg | ORAL_TABLET | Freq: Every day | ORAL | Status: DC

## 2015-10-09 MED ORDER — ALPRAZOLAM 0.25 MG PO TABS: 0.2500 mg | ORAL_TABLET | Freq: Two times a day (BID) | ORAL | Status: DC | PRN
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
## 2015-11-14 ENCOUNTER — Encounter: Payer: Self-pay | Admitting: Family Medicine

## 2015-11-14 ENCOUNTER — Ambulatory Visit (INDEPENDENT_AMBULATORY_CARE_PROVIDER_SITE_OTHER): Payer: Medicare Other | Admitting: Family Medicine

## 2015-11-14 VITALS — BP 132/78 | HR 74 | Temp 100.3°F | Resp 16 | Ht 61.0 in | Wt 221.0 lb

## 2015-11-14 DIAGNOSIS — J0100 Acute maxillary sinusitis, unspecified: Secondary | ICD-10-CM | POA: Diagnosis not present

## 2015-11-14 DIAGNOSIS — R509 Fever, unspecified: Secondary | ICD-10-CM | POA: Diagnosis not present

## 2015-11-14 DIAGNOSIS — J189 Pneumonia, unspecified organism: Secondary | ICD-10-CM | POA: Diagnosis not present

## 2015-11-14 LAB — INFLUENZA A AND B AG, IMMUNOASSAY
Influenza A Antigen: NOT DETECTED
Influenza B Antigen: NOT DETECTED

## 2015-11-14 MED ORDER — LEVOFLOXACIN 500 MG PO TABS: 500.0000 mg | ORAL_TABLET | Freq: Every day | ORAL | Status: DC

## 2015-11-14 NOTE — Patient Instructions (Addendum)
Take antibiotics and cough syrup F/U as needed

## 2015-11-14 NOTE — Progress Notes (Signed)
Patient ID: Lynn [name], female   DOB: [birth year], 65 y.o.   MRN: [id number]   Subjective:    Patient ID: Lynn [name], female    DOB: 1951-04-10, 65 y.o.   MRN: [id number]  Patient presents for Illness  patient here with cough with mild production fever or body aches and headache/sinus congestion for the past 3 days. She does not recall her fever was at home. She has been taking Tylenol but states it has not helped her headache very much. She is also taking Coricidin and Mucinex for the cough. She is a former smoker. She does not have any history of any asthma or COPD documented. No GI symptoms.    Review Of Systems:  GEN- denies fatigue,+ fever, weight loss,weakness, recent illness HEENT- denies eye drainage, change in vision,+ nasal discharge, CVS- denies chest pain, palpitations RESP- denies SOB, cough, wheeze ABD- denies N/V, change in stools, abd pain GU- denies dysuria, hematuria, dribbling, incontinence MSK- denies joint pain,+ muscle aches, injury Neuro-+ headache, dizziness, syncope, seizure activity       Objective:    BP 132/78 mmHg  Pulse 74  Temp(Src) 100.3 F (37.9 C) (Oral)  Resp 16  Ht 5\' 1"  (1.549 m)  Wt 221 lb (100.245 kg)  BMI 41.78 kg/m2  SpO2 98% GEN- NAD, alert and oriented x3 HEENT- PERRL, EOMI, non injected sclera, pink conjunctiva, MMM, oropharynx clear , TM clear bilat no effusion,  + maxillary sinus tenderness,clear  Nasal drainage  Neck- Supple, no LAD CVS- RRR, no murmur RESP-rales right base, normal WOB, no wheeze EXT- No edema Pulses- Radial 2+          Assessment & Plan:      Problem List Items Addressed This Visit    None    Visit Diagnoses    CAP (community acquired pneumonia)    -  Primary    Flu neg, based on exam early PNA developing, also with some early sinusitis signs. Start Levaquin, continue Coricidan or mucinex. Discussed nature of illness    Relevant Medications    levofloxacin (LEVAQUIN) 500 MG tablet    Other Relevant Orders    Influenza A and B Ag, Immunoassay    Acute maxillary sinusitis, recurrence not specified        Relevant Medications    levofloxacin (LEVAQUIN) 500 MG tablet       Note: This dictation was prepared with Dragon dictation along with smaller phrase technology. Any transcriptional errors that result from this process are unintentional.

## 2015-11-22 ENCOUNTER — Encounter: Payer: Self-pay | Admitting: Physician Assistant

## 2015-11-22 ENCOUNTER — Ambulatory Visit (INDEPENDENT_AMBULATORY_CARE_PROVIDER_SITE_OTHER): Payer: Medicare Other | Admitting: Physician Assistant

## 2015-11-22 VITALS — BP 126/84 | HR 84 | Temp 98.2°F | Resp 18 | Wt 224.0 lb

## 2015-11-22 DIAGNOSIS — F329 Major depressive disorder, single episode, unspecified: Secondary | ICD-10-CM

## 2015-11-22 DIAGNOSIS — G4700 Insomnia, unspecified: Secondary | ICD-10-CM | POA: Diagnosis not present

## 2015-11-22 DIAGNOSIS — F411 Generalized anxiety disorder: Secondary | ICD-10-CM | POA: Diagnosis not present

## 2015-11-22 DIAGNOSIS — F32A Depression, unspecified: Secondary | ICD-10-CM

## 2015-11-22 MED ORDER — ALPRAZOLAM 0.5 MG PO TABS: 0.5000 mg | ORAL_TABLET | Freq: Two times a day (BID) | ORAL | Status: DC | PRN

## 2015-11-22 MED ORDER — PAROXETINE HCL 40 MG PO TABS: 40.0000 mg | ORAL_TABLET | ORAL | Status: DC
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
## 2015-11-27 ENCOUNTER — Ambulatory Visit: Payer: Medicare Other | Admitting: Family Medicine

## 2015-11-27 ENCOUNTER — Other Ambulatory Visit: Payer: Self-pay | Admitting: Physician Assistant

## 2015-11-28 NOTE — Telephone Encounter (Signed)
Medication refilled per protocol. 

## 2015-12-05 ENCOUNTER — Telehealth: Payer: Self-pay | Admitting: Family Medicine

## 2015-12-05 MED ORDER — ALPRAZOLAM 1 MG PO TABS: 1.0000 mg | ORAL_TABLET | Freq: Two times a day (BID) | ORAL | Status: DC | PRN

## 2015-12-05 NOTE — Telephone Encounter (Signed)
Xanax 1mg  1 po BID PRN # 60 + 0

## 2015-12-05 NOTE — Telephone Encounter (Signed)
New rx called into pharmacy, have left message for patient

## 2015-12-05 NOTE — Telephone Encounter (Signed)
Patient calling says the Paxil is working well but the prn xanax is not helping her much when has a panic attack.  Can we increase the strength?

## 2015-12-06 ENCOUNTER — Other Ambulatory Visit: Payer: Self-pay | Admitting: Physician Assistant

## 2015-12-06 NOTE — Telephone Encounter (Signed)
Pt called back, aware of change.  Reminded her to keep April appt

## 2015-12-06 NOTE — Telephone Encounter (Signed)
paxil 20 denied, pt now on 40

## 2016-01-03 ENCOUNTER — Ambulatory Visit (INDEPENDENT_AMBULATORY_CARE_PROVIDER_SITE_OTHER): Payer: Medicare Other | Admitting: Physician Assistant

## 2016-01-03 ENCOUNTER — Encounter: Payer: Self-pay | Admitting: Physician Assistant

## 2016-01-03 VITALS — BP 130/80 | HR 78 | Temp 97.7°F | Resp 18 | Ht 61.0 in | Wt 211.0 lb

## 2016-01-03 DIAGNOSIS — F411 Generalized anxiety disorder: Secondary | ICD-10-CM | POA: Diagnosis not present

## 2016-01-03 DIAGNOSIS — F410 Panic disorder [episodic paroxysmal anxiety] without agoraphobia: Secondary | ICD-10-CM | POA: Diagnosis not present

## 2016-01-03 DIAGNOSIS — F329 Major depressive disorder, single episode, unspecified: Secondary | ICD-10-CM

## 2016-01-03 DIAGNOSIS — F32A Depression, unspecified: Secondary | ICD-10-CM

## 2016-01-03 DIAGNOSIS — G4700 Insomnia, unspecified: Secondary | ICD-10-CM

## 2016-01-03 MED ORDER — ALPRAZOLAM 1 MG PO TABS: 1.0000 mg | ORAL_TABLET | Freq: Two times a day (BID) | ORAL | Status: DC | PRN

## 2016-01-03 MED ORDER — PAROXETINE HCL 40 MG PO TABS: 40.0000 mg | ORAL_TABLET | ORAL | Status: DC
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
## 2016-03-07 ENCOUNTER — Encounter: Payer: Self-pay | Admitting: Physician Assistant

## 2016-03-07 ENCOUNTER — Ambulatory Visit (INDEPENDENT_AMBULATORY_CARE_PROVIDER_SITE_OTHER): Payer: Medicare Other | Admitting: Physician Assistant

## 2016-03-07 VITALS — BP 130/80 | HR 96 | Temp 99.1°F | Ht 61.0 in | Wt 202.0 lb

## 2016-03-07 DIAGNOSIS — F32A Depression, unspecified: Secondary | ICD-10-CM

## 2016-03-07 DIAGNOSIS — F410 Panic disorder [episodic paroxysmal anxiety] without agoraphobia: Secondary | ICD-10-CM | POA: Diagnosis not present

## 2016-03-07 DIAGNOSIS — F411 Generalized anxiety disorder: Secondary | ICD-10-CM | POA: Diagnosis not present

## 2016-03-07 DIAGNOSIS — F329 Major depressive disorder, single episode, unspecified: Secondary | ICD-10-CM

## 2016-03-07 DIAGNOSIS — F4310 Post-traumatic stress disorder, unspecified: Secondary | ICD-10-CM | POA: Diagnosis not present

## 2016-03-07 MED ORDER — ALPRAZOLAM 1 MG PO TABS: 1.0000 mg | ORAL_TABLET | Freq: Two times a day (BID) | ORAL | Status: DC | PRN
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
## 2016-04-10 ENCOUNTER — Encounter: Payer: Self-pay | Admitting: Physician Assistant

## 2016-04-10 ENCOUNTER — Ambulatory Visit (INDEPENDENT_AMBULATORY_CARE_PROVIDER_SITE_OTHER): Payer: Medicare Other | Admitting: Physician Assistant

## 2016-04-10 VITALS — BP 132/84 | HR 96 | Temp 98.2°F | Resp 18 | Wt 202.0 lb

## 2016-04-10 DIAGNOSIS — E038 Other specified hypothyroidism: Secondary | ICD-10-CM

## 2016-04-10 DIAGNOSIS — E669 Obesity, unspecified: Secondary | ICD-10-CM | POA: Diagnosis not present

## 2016-04-10 DIAGNOSIS — K2100 Gastro-esophageal reflux disease with esophagitis, without bleeding: Secondary | ICD-10-CM

## 2016-04-10 DIAGNOSIS — E559 Vitamin D deficiency, unspecified: Secondary | ICD-10-CM

## 2016-04-10 DIAGNOSIS — F32A Depression, unspecified: Secondary | ICD-10-CM

## 2016-04-10 DIAGNOSIS — F329 Major depressive disorder, single episode, unspecified: Secondary | ICD-10-CM

## 2016-04-10 DIAGNOSIS — F411 Generalized anxiety disorder: Secondary | ICD-10-CM | POA: Diagnosis not present

## 2016-04-10 DIAGNOSIS — E785 Hyperlipidemia, unspecified: Secondary | ICD-10-CM | POA: Diagnosis not present

## 2016-04-10 DIAGNOSIS — Z8249 Family history of ischemic heart disease and other diseases of the circulatory system: Secondary | ICD-10-CM

## 2016-04-10 DIAGNOSIS — F4310 Post-traumatic stress disorder, unspecified: Secondary | ICD-10-CM

## 2016-04-10 DIAGNOSIS — R739 Hyperglycemia, unspecified: Secondary | ICD-10-CM

## 2016-04-10 DIAGNOSIS — I10 Essential (primary) hypertension: Secondary | ICD-10-CM | POA: Diagnosis not present

## 2016-04-10 DIAGNOSIS — E039 Hypothyroidism, unspecified: Secondary | ICD-10-CM

## 2016-04-10 DIAGNOSIS — F410 Panic disorder [episodic paroxysmal anxiety] without agoraphobia: Secondary | ICD-10-CM | POA: Diagnosis not present

## 2016-04-10 LAB — HEMOGLOBIN A1C
HEMOGLOBIN A1C: 5.8 % — AB (ref <5.7)
Mean Plasma Glucose: 120 mg/dL

## 2016-04-10 LAB — COMPLETE METABOLIC PANEL WITH GFR
ALBUMIN: 4.4 g/dL (ref 3.6–5.1)
ALK PHOS: 90 U/L (ref 33–130)
ALT: 9 U/L (ref 6–29)
AST: 14 U/L (ref 10–35)
BUN: 22 mg/dL (ref 7–25)
CO2: 25 mmol/L (ref 20–31)
Calcium: 9.7 mg/dL (ref 8.6–10.4)
Chloride: 105 mmol/L (ref 98–110)
Creat: 0.65 mg/dL (ref 0.50–0.99)
GFR, Est African American: >89 mL/min (ref >=60)
GFR, Est Non African American: >89 mL/min (ref >=60)
GLUCOSE: 115 mg/dL — AB (ref 70–99)
POTASSIUM: 4.5 mmol/L (ref 3.5–5.3)
SODIUM: 142 mmol/L (ref 135–146)
TOTAL PROTEIN: 7.5 g/dL (ref 6.1–8.1)
Total Bilirubin: 0.6 mg/dL (ref 0.2–1.2)

## 2016-04-10 LAB — LIPID PANEL
CHOL/HDL RATIO: 3.5 ratio (ref <=5.0)
Cholesterol: 177 mg/dL (ref 125–200)
HDL: 50 mg/dL (ref >=46)
LDL CALC: 108 mg/dL (ref <130)
Triglycerides: 93 mg/dL (ref <150)
VLDL: 19 mg/dL (ref <30)

## 2016-04-10 LAB — TSH: TSH: 2.4 m[IU]/L

## 2016-04-10 LAB — T4, FREE: Free T4: 1.2 ng/dL (ref 0.8–1.8)
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
## 2016-04-15 ENCOUNTER — Encounter: Payer: Self-pay | Admitting: Family Medicine

## 2016-04-17 ENCOUNTER — Other Ambulatory Visit: Payer: Self-pay | Admitting: Physician Assistant

## 2016-04-17 DIAGNOSIS — Z1231 Encounter for screening mammogram for malignant neoplasm of breast: Secondary | ICD-10-CM | POA: Diagnosis not present

## 2016-04-17 DIAGNOSIS — Z803 Family history of malignant neoplasm of breast: Secondary | ICD-10-CM | POA: Diagnosis not present

## 2016-04-17 LAB — HM MAMMOGRAPHY

## 2016-04-18 ENCOUNTER — Encounter: Payer: Self-pay | Admitting: Family Medicine

## 2016-05-13 ENCOUNTER — Other Ambulatory Visit: Payer: Self-pay | Admitting: Physician Assistant

## 2016-05-13 NOTE — Telephone Encounter (Signed)
Approved. #60+1. 

## 2016-05-13 NOTE — Telephone Encounter (Signed)
LRF 03/07/16 #60  + 1  [facility] 04/10/16  OK refill?

## 2016-05-14 ENCOUNTER — Other Ambulatory Visit: Payer: Self-pay

## 2016-05-16 MED ORDER — ALPRAZOLAM 1 MG PO TABS: ORAL_TABLET | ORAL | 1 refills | Status: DC

## 2016-05-20 ENCOUNTER — Other Ambulatory Visit: Payer: Self-pay | Admitting: Physician Assistant

## 2016-06-04 ENCOUNTER — Telehealth: Payer: Self-pay

## 2016-06-04 NOTE — Telephone Encounter (Signed)
Pt called and stated every since she has been on nexium 40 mg it has caused her stomach to hurt on lower left side and caused her to have nausea. Pt states this has been going on for about 2 months. Please advise

## 2016-06-05 NOTE — Telephone Encounter (Signed)
Have her schedule OV with me to evaluate.

## 2016-06-12 ENCOUNTER — Ambulatory Visit: Payer: Self-pay | Admitting: Physician Assistant

## 2016-07-28 ENCOUNTER — Emergency Department (HOSPITAL_COMMUNITY): Payer: Medicare Other

## 2016-07-28 ENCOUNTER — Emergency Department (HOSPITAL_COMMUNITY)
Admission: EM | Admit: 2016-07-28 | Discharge: 2016-07-28 | Disposition: A | Discharge status: Home / Self Care | Payer: Medicare Other | Attending: Emergency Medicine | Admitting: Emergency Medicine

## 2016-07-28 ENCOUNTER — Encounter (HOSPITAL_COMMUNITY): Payer: Self-pay | Admitting: Cardiology

## 2016-07-28 DIAGNOSIS — Z79899 Other long term (current) drug therapy: Secondary | ICD-10-CM | POA: Diagnosis not present

## 2016-07-28 DIAGNOSIS — R110 Nausea: Secondary | ICD-10-CM | POA: Diagnosis not present

## 2016-07-28 DIAGNOSIS — Z87891 Personal history of nicotine dependence: Secondary | ICD-10-CM | POA: Diagnosis not present

## 2016-07-28 DIAGNOSIS — R109 Unspecified abdominal pain: Secondary | ICD-10-CM

## 2016-07-28 DIAGNOSIS — I10 Essential (primary) hypertension: Secondary | ICD-10-CM | POA: Insufficient documentation

## 2016-07-28 DIAGNOSIS — R1011 Right upper quadrant pain: Secondary | ICD-10-CM | POA: Insufficient documentation

## 2016-07-28 DIAGNOSIS — M791 Myalgia: Secondary | ICD-10-CM | POA: Diagnosis not present

## 2016-07-28 LAB — COMPREHENSIVE METABOLIC PANEL
ALBUMIN: 4.2 g/dL (ref 3.5–5.0)
ALK PHOS: 84 U/L (ref 38–126)
ALT: 16 U/L (ref 14–54)
AST: 20 U/L (ref 15–41)
Anion gap: 6 (ref 5–15)
BUN: 12 mg/dL (ref 6–20)
CALCIUM: 9.3 mg/dL (ref 8.9–10.3)
CO2: 28 mmol/L (ref 22–32)
CREATININE: 0.74 mg/dL (ref 0.44–1.00)
Chloride: 107 mmol/L (ref 101–111)
GFR calc Af Amer: >60 mL/min (ref >60)
GFR calc non Af Amer: >60 mL/min (ref >60)
GLUCOSE: 108 mg/dL — AB (ref 65–99)
Potassium: 4 mmol/L (ref 3.5–5.1)
SODIUM: 141 mmol/L (ref 135–145)
Total Bilirubin: 0.8 mg/dL (ref 0.3–1.2)
Total Protein: 7.8 g/dL (ref 6.5–8.1)

## 2016-07-28 LAB — URINALYSIS, ROUTINE W REFLEX MICROSCOPIC
BILIRUBIN URINE: NEGATIVE
Glucose, UA: NEGATIVE mg/dL
Ketones, ur: NEGATIVE mg/dL
Leukocytes, UA: NEGATIVE
Nitrite: NEGATIVE
PH: 6 (ref 5.0–8.0)
Protein, ur: NEGATIVE mg/dL

## 2016-07-28 LAB — URINE MICROSCOPIC-ADD ON

## 2016-07-28 LAB — LIPASE, BLOOD: Lipase: 17 U/L (ref 11–51)

## 2016-07-28 LAB — CBC
HCT: 47.5 % — ABNORMAL HIGH (ref 36.0–46.0)
Hemoglobin: 16.1 g/dL — ABNORMAL HIGH (ref 12.0–15.0)
MCH: 31.6 pg (ref 26.0–34.0)
MCHC: 33.9 g/dL (ref 30.0–36.0)
MCV: 93.1 fL (ref 78.0–100.0)
PLATELETS: 226 10*3/uL (ref 150–400)
RBC: 5.1 MIL/uL (ref 3.87–5.11)
RDW: 13.4 % (ref 11.5–15.5)
WBC: 8.8 10*3/uL (ref 4.0–10.5)

## 2016-07-28 MED ORDER — ONDANSETRON HCL 4 MG/2ML IJ SOLN
4.0000 mg | Freq: Once | INTRAMUSCULAR | Status: AC
  Administered 2016-07-28: 4 mg via INTRAVENOUS
  Filled 2016-07-28: qty 2

## 2016-07-28 MED ORDER — SODIUM CHLORIDE 0.9 % IV BOLUS (SEPSIS)
1000.0000 mL | Freq: Once | INTRAVENOUS | Status: AC
Start: 2016-07-28 — End: 2016-07-28
  Administered 2016-07-28: 1000 mL via INTRAVENOUS

## 2016-07-28 MED ORDER — MORPHINE SULFATE (PF) 4 MG/ML IV SOLN
4.0000 mg | Freq: Once | INTRAVENOUS | Status: AC
  Administered 2016-07-28: 4 mg via INTRAVENOUS
  Filled 2016-07-28: qty 1

## 2016-07-28 MED ORDER — ONDANSETRON HCL 8 MG PO TABS: 8.0000 mg | ORAL_TABLET | Freq: Three times a day (TID) | ORAL | 0 refills | Status: DC | PRN

## 2016-07-28 NOTE — ED Triage Notes (Signed)
Right flank pain that radiates up into neck and right side of abdomen since Wednesday;  Also states she noticed a knot to her right lower back Wednesday.
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
## 2016-07-29 ENCOUNTER — Ambulatory Visit: Payer: Medicare Other | Admitting: Physician Assistant

## 2016-08-01 ENCOUNTER — Encounter: Payer: Self-pay | Admitting: Physician Assistant

## 2016-08-01 ENCOUNTER — Ambulatory Visit (INDEPENDENT_AMBULATORY_CARE_PROVIDER_SITE_OTHER): Payer: Medicare Other | Admitting: Physician Assistant

## 2016-08-01 VITALS — BP 110/70 | HR 80 | Temp 98.4°F | Resp 16 | Wt 200.0 lb

## 2016-08-01 DIAGNOSIS — M545 Low back pain, unspecified: Secondary | ICD-10-CM

## 2016-08-01 MED ORDER — CYCLOBENZAPRINE HCL 10 MG PO TABS: 10.0000 mg | ORAL_TABLET | Freq: Three times a day (TID) | ORAL | 1 refills | Status: DC | PRN

## 2016-08-01 NOTE — Progress Notes (Signed)
Patient ID: Lynn [name] MRN: [id number], DOB: [birth year], 65 y.o. Date of Encounter: 08/01/2016, 2:54 PM    Chief Complaint:  Chief Complaint  Patient presents with  . knot on lower right back    ER follow up     HPI: 65 y.o. year old female presents with above.   I have reviewed her ER note dated 07/28/16. At that time she presented with intermittent episodes of shooting pains in her right side / flank for 4 days. Reported that the pain shoots to the right side of her abdomen and upper right shoulder. Associated symptoms included nausea. She also had noticed a knot in her right lower back 4 days prior. Their physical exam was notable for some right upper quadrant tenderness.  They performed some labs including CBC be met UA.  They obtained ultrasound. This showed no findings to explain her history of right-sided abdominal pain with nausea.  At follow-up evaluation patient was feeling much better. Repeat abdominal exam demonstrated no right lower quadrant tenderness. They reviewed that ultrasound showed no cholelithiasis and no signs of cholecystitis. No evidence of right-sided hydronephrosis. Labs and urine without significant abnormalities. Patient was feeling better. Felt that it may be musculoskeletal in nature. She was stable for discharge with follow-up with PCP.    Today patient states that since then she has not been feeling the discomfort coming around to the right abdomen. Uses her hand and points to her right low back and says that the discomfort is starting down there and then sometimes going up to the right mid back. Says that she can feel muscles drawing up in those regions. Says that when she sleeps at night she likes to lay on her left side and when she lays on her left side she can feel the areas of tightness moving.  She has had lumbar back surgery in the past and says that she has called her back doctor and scheduled an appointment there--but it is not until  December.  She has no other complaints or concerns today.     Home Meds:   Outpatient Medications Prior to Visit  Medication Sig Dispense Refill  . ALPRAZolam (XANAX) 1 MG tablet TAKE 1 TABLET BY MOUTH TWICE A DAY AS NEEDED FOR ANXIETY 60 tablet 1  . amLODipine (NORVASC) 10 MG tablet TAKE 1 TABLET (10 MG TOTAL) BY MOUTH DAILY. 90 tablet 1  . benazepril (LOTENSIN) 20 MG tablet TAKE 1 TABLET (20 MG TOTAL) BY MOUTH DAILY. 90 tablet 1  . cholecalciferol (VITAMIN D) 400 UNITS TABS tablet Take 400 Units by mouth 2 (two) times daily.    . ondansetron (ZOFRAN) 8 MG tablet Take 1 tablet (8 mg total) by mouth every 8 (eight) hours as needed for nausea or vomiting. 12 tablet 0  . PARoxetine (PAXIL) 40 MG tablet Take 1 tablet (40 mg total) by mouth every morning. 30 tablet 5  . pravastatin (PRAVACHOL) 80 MG tablet TAKE 1 TABLET (80 MG TOTAL) BY MOUTH DAILY. 90 tablet 1  . NEXIUM 40 MG capsule TAKE ONE CAPSULE BY MOUTH EVERY MORNING (Patient not taking: Reported on 08/01/2016) 90 capsule 3   No facility-administered medications prior to visit.     Allergies:  Allergies  Allergen Reactions  . Diclofenac Sodium     REACTION: "throat closed up"  . Hydrocodone Itching and Nausea And Vomiting  . Lipitor [Atorvastatin Calcium] Other (See Comments)    Increased LFT's, myalgias  . Niacin  REACTION: flushing  . Other Other (See Comments)    Allergic to metal states body rejects it  . Zolpidem Other (See Comments)    Severe headache      Review of Systems: See HPI for pertinent ROS. All other ROS negative.    Physical Exam: Blood pressure 110/70, pulse 80, temperature 98.4 F (36.9 C), resp. rate 16, weight 200 lb (90.7 kg), SpO2 96 %., Body mass index is 37.79 kg/m. General:  Obese WF. Appears in no acute distress. Neck: Supple. No thyromegaly. No lymphadenopathy. Lungs: Clear bilaterally to auscultation without wheezes, rales, or rhonchi. Breathing is unlabored. Heart: Regular rhythm.  No murmurs, rubs, or gallops. Msk:  Strength and tone normal for age. She has scars from remote lumbar surgery. She points to right low back and then up the side of the right back towards the mid right back as the area of discomfort. I have palpated this region but she reports that this does not reproduce the symptoms/pain. Inspection of this area is normal. There is no rash there. Extremities/Skin: Warm and dry.  Neuro: Alert and oriented X 3. Moves all extremities spontaneously. Gait is normal. CNII-XII grossly in tact. Psych:  Responds to questions appropriately with a normal affect.     ASSESSMENT AND PLAN:  65 y.o. year old female with  1. Acute right-sided low back pain without sciatica I feel that this is secondary to muscle spasm/muscle strain. Discussed with patient that she needs to rest the muscles of her low back. Gave examples of things not to do and things to do. Also recommend applying heat to the area. As well we'll have her use Flexeril as muscle relaxer. Cautioned her about possible drowsiness with this. Not take prior to driving. Follow-up if symptoms worsen or do not improve over the coming week or if she develops additional new signs or symptoms. - cyclobenzaprine (FLEXERIL) 10 MG tablet; Take 1 tablet (10 mg total) by mouth 3 (three) times daily as needed for muscle spasms.  Dispense: 30 tablet; Refill: 1   Signed, [address] [place], [place], [facility] 08/01/2016 2:54 PM

## 2016-08-06 ENCOUNTER — Other Ambulatory Visit: Payer: Self-pay | Admitting: Physician Assistant

## 2016-08-06 NOTE — Telephone Encounter (Signed)
Approved # [id number] 

## 2016-08-06 NOTE — Telephone Encounter (Signed)
Rx phoned in.   

## 2016-08-06 NOTE — Telephone Encounter (Signed)
Ok to refill 

## 2016-08-19 DIAGNOSIS — M5416 Radiculopathy, lumbar region: Secondary | ICD-10-CM | POA: Diagnosis not present

## 2016-08-19 DIAGNOSIS — M5412 Radiculopathy, cervical region: Secondary | ICD-10-CM | POA: Diagnosis not present

## 2016-08-19 DIAGNOSIS — K432 Incisional hernia without obstruction or gangrene: Secondary | ICD-10-CM | POA: Diagnosis not present

## 2016-08-27 ENCOUNTER — Other Ambulatory Visit: Payer: Self-pay | Admitting: Neurosurgery

## 2016-08-27 DIAGNOSIS — K432 Incisional hernia without obstruction or gangrene: Secondary | ICD-10-CM

## 2016-09-02 ENCOUNTER — Ambulatory Visit
Admission: RE | Admit: 2016-09-02 | Discharge: 2016-09-02 | Disposition: A | Discharge status: Home / Self Care | Payer: Medicare Other | Source: Ambulatory Visit | Attending: Neurosurgery | Admitting: Neurosurgery

## 2016-09-02 DIAGNOSIS — K432 Incisional hernia without obstruction or gangrene: Secondary | ICD-10-CM

## 2016-09-02 DIAGNOSIS — R1909 Other intra-abdominal and pelvic swelling, mass and lump: Secondary | ICD-10-CM | POA: Diagnosis not present

## 2016-09-02 MED ORDER — IOPAMIDOL (ISOVUE-300) INJECTION 61%
100.0000 mL | Freq: Once | INTRAVENOUS | Status: AC | PRN
  Administered 2016-09-02: 100 mL via INTRAVENOUS

## 2016-09-05 ENCOUNTER — Other Ambulatory Visit: Payer: Self-pay | Admitting: Physician Assistant

## 2016-09-05 DIAGNOSIS — F32A Depression, unspecified: Secondary | ICD-10-CM

## 2016-09-05 DIAGNOSIS — F329 Major depressive disorder, single episode, unspecified: Secondary | ICD-10-CM

## 2016-09-05 DIAGNOSIS — F411 Generalized anxiety disorder: Secondary | ICD-10-CM

## 2016-09-05 DIAGNOSIS — G4700 Insomnia, unspecified: Secondary | ICD-10-CM

## 2016-10-09 DIAGNOSIS — Z6837 Body mass index (BMI) 37.0-37.9, adult: Secondary | ICD-10-CM | POA: Diagnosis not present

## 2016-10-09 DIAGNOSIS — I10 Essential (primary) hypertension: Secondary | ICD-10-CM | POA: Diagnosis not present

## 2016-10-09 DIAGNOSIS — K432 Incisional hernia without obstruction or gangrene: Secondary | ICD-10-CM | POA: Diagnosis not present

## 2016-10-14 ENCOUNTER — Ambulatory Visit (INDEPENDENT_AMBULATORY_CARE_PROVIDER_SITE_OTHER): Payer: Medicare Other | Admitting: Physician Assistant

## 2016-10-14 ENCOUNTER — Encounter: Payer: Self-pay | Admitting: Physician Assistant

## 2016-10-14 VITALS — BP 118/78 | HR 90 | Temp 98.4°F | Resp 16 | Wt 195.8 lb

## 2016-10-14 DIAGNOSIS — R739 Hyperglycemia, unspecified: Secondary | ICD-10-CM | POA: Diagnosis not present

## 2016-10-14 DIAGNOSIS — Z23 Encounter for immunization: Secondary | ICD-10-CM | POA: Diagnosis not present

## 2016-10-14 DIAGNOSIS — E559 Vitamin D deficiency, unspecified: Secondary | ICD-10-CM | POA: Diagnosis not present

## 2016-10-14 DIAGNOSIS — G4700 Insomnia, unspecified: Secondary | ICD-10-CM

## 2016-10-14 DIAGNOSIS — F320 Major depressive disorder, single episode, mild: Secondary | ICD-10-CM

## 2016-10-14 DIAGNOSIS — F4310 Post-traumatic stress disorder, unspecified: Secondary | ICD-10-CM | POA: Diagnosis not present

## 2016-10-14 DIAGNOSIS — E785 Hyperlipidemia, unspecified: Secondary | ICD-10-CM | POA: Diagnosis not present

## 2016-10-14 DIAGNOSIS — F410 Panic disorder [episodic paroxysmal anxiety] without agoraphobia: Secondary | ICD-10-CM

## 2016-10-14 DIAGNOSIS — I10 Essential (primary) hypertension: Secondary | ICD-10-CM

## 2016-10-14 DIAGNOSIS — F411 Generalized anxiety disorder: Secondary | ICD-10-CM | POA: Diagnosis not present

## 2016-10-14 DIAGNOSIS — Z8249 Family history of ischemic heart disease and other diseases of the circulatory system: Secondary | ICD-10-CM

## 2016-10-14 LAB — COMPLETE METABOLIC PANEL WITH GFR
ALBUMIN: 4.3 g/dL (ref 3.6–5.1)
ALK PHOS: 78 U/L (ref 33–130)
ALT: 9 U/L (ref 6–29)
AST: 14 U/L (ref 10–35)
BILIRUBIN TOTAL: 0.8 mg/dL (ref 0.2–1.2)
BUN: 16 mg/dL (ref 7–25)
CO2: 25 mmol/L (ref 20–31)
CREATININE: 0.66 mg/dL (ref 0.50–0.99)
Calcium: 9.5 mg/dL (ref 8.6–10.4)
Chloride: 106 mmol/L (ref 98–110)
GFR, Est African American: >89 mL/min (ref >=60)
GLUCOSE: 111 mg/dL — AB (ref 70–99)
Potassium: 4.2 mmol/L (ref 3.5–5.3)
SODIUM: 141 mmol/L (ref 135–146)
TOTAL PROTEIN: 7.4 g/dL (ref 6.1–8.1)

## 2016-10-14 LAB — HEMOGLOBIN A1C
Hgb A1c MFr Bld: 5.4 % (ref <5.7)
MEAN PLASMA GLUCOSE: 108 mg/dL

## 2016-10-14 LAB — LIPID PANEL
Cholesterol: 164 mg/dL (ref <200)
HDL: 46 mg/dL — ABNORMAL LOW (ref >50)
LDL CALC: 95 mg/dL (ref <100)
Total CHOL/HDL Ratio: 3.6 Ratio (ref <5.0)
Triglycerides: 117 mg/dL (ref <150)
VLDL: 23 mg/dL (ref <30)
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
## 2016-10-15 NOTE — Addendum Note (Signed)
Addended by: Vonna Kotyk A on: 10/15/2016 03:50 PM   Modules accepted: Orders

## 2016-11-03 ENCOUNTER — Other Ambulatory Visit: Payer: Self-pay | Admitting: Physician Assistant

## 2016-11-04 NOTE — Telephone Encounter (Signed)
Approved # [id number] 

## 2016-11-04 NOTE — Telephone Encounter (Signed)
Rx phoned into pharmacy.

## 2016-11-04 NOTE — Telephone Encounter (Signed)
Last OV 10-14-2016 Last refill 08-06-2016 Ok to refill?

## 2016-11-07 ENCOUNTER — Other Ambulatory Visit: Payer: Self-pay | Admitting: Physician Assistant

## 2016-11-08 NOTE — Telephone Encounter (Signed)
Refill appropriate 

## 2016-11-26 ENCOUNTER — Other Ambulatory Visit: Payer: Self-pay | Admitting: Physician Assistant

## 2016-12-03 DIAGNOSIS — K432 Incisional hernia without obstruction or gangrene: Secondary | ICD-10-CM | POA: Diagnosis not present

## 2016-12-23 ENCOUNTER — Encounter: Payer: Self-pay | Admitting: Physician Assistant

## 2017-01-20 ENCOUNTER — Encounter: Payer: Self-pay | Admitting: Physician Assistant

## 2017-01-20 ENCOUNTER — Ambulatory Visit: Payer: Self-pay | Admitting: General Surgery

## 2017-01-20 DIAGNOSIS — K432 Incisional hernia without obstruction or gangrene: Secondary | ICD-10-CM | POA: Diagnosis not present

## 2017-01-20 NOTE — H&P (Signed)
Lynn [name] 01/20/2017 11:11 AM Location: [facility] Patient #: [id number] DOB: Nov 02, 1950 Single / Language: Lynn [name] / Race: White Female  History of Present Illness Lynn Hollingshead MD; 01/20/2017 11:37 AM) The patient is a 66 year old female.   Note:She presents today because she is ready to schedule her right flank/lumbar incisional hernia repair. She says her mother is doing much better. She has family to help her postoperatively. She has stopped smoking. She still having some discomfort from the hernia. I recommended she get a thin abdominal binder for that but she has not purchased that yet.  Allergies (Lynn [name], CMA; 01/20/2017 11:11 AM) Diclofenac *ANALGESICS - ANTI-INFLAMMATORY* Hives Hydrocodone-Acetaminophen *ANALGESICS - OPIOID* itching, nausea, vomiting Lipitor *ANTIHYPERLIPIDEMICS* Itching Niacin (Antihyperlipidemic) *ANTIHYPERLIPIDEMICS* Rash Zolpidem Tartrate *HYPNOTICS/SEDATIVES/SLEEP DISORDER AGENTS* Hives Allergies Reconciled  Medication History (Lynn [name], CMA; 01/20/2017 11:11 AM) ALPRAZolam (1MG  Tablet, Oral daily) Active. AmLODIPine Besylate (10MG  Tablet, Oral daily) Active. Benazepril HCl (20MG  Tablet, Oral daily) Active. PARoxetine HCl (40MG  Tablet, Oral daily) Active. Pravastatin Sodium (80MG  Tablet, Oral daily) Active. Vitamin D (Cholecalciferol) (1000UNIT Capsule, Oral daily) Active. Cyclobenzaprine HCl (10MG  Tablet, Oral as needed) Active. NexIUM (40MG  Capsule DR, Oral daily) Active. Medications Reconciled    Vitals (Lynn [name] CMA; 01/20/2017 11:12 AM) 01/20/2017 11:11 AM Weight: 190.25 lb Height: 61in Body Surface Area: 1.85 m Body Mass Index: 35.95 kg/m  Temp.: 98.58F(Oral)  Pulse: 90 (Regular)  BP: 136/60 (Sitting, Left Arm, Standard)      Physical Exam Lynn Hollingshead MD; 01/20/2017 11:34 AM)  The physical exam findings are as follows: Note:General-oh obese female in no acute  distress.  Abdomen-long midline scar with absence of umbilicus, small right flank incision with palpable bulge that is reducible.    Assessment & Plan Lynn Hollingshead MD; 01/20/2017 11:35 AM)  Lynn [name] HERNIA, WITHOUT OBSTRUCTION OR GANGRENE (K43.2) Impression: She is ready to schedule repair.  Plan: Laparoscopic possible open repair. We'll start the scheduling process.  Lynn [name], [name].

## 2017-01-22 NOTE — Patient Instructions (Addendum)
Lynn [name]  01/22/2017   Your procedure is scheduled on: 01/28/2017    Report to [facility] Main  Entrance Take [place]  elevators to 3rd floor to  [place] at   1130 AM.    Call this number if you have problems the morning of surgery [phone]    Remember: ONLY 1 PERSON MAY GO WITH YOU TO SHORT STAY TO GET  READY MORNING OF YOUR SURGERY.  Do not eat food after midnite.  May have clear liquids from 12 midnite until 0700am then nothing by mouth.       Take these medicines the morning of surgery with A SIP OF WATER: Xanax, Amlodipine ( Norvasc), Nexium                                 You may not have any metal on your body including hair pins and              piercings  Do not wear jewelry, make-up, lotions, powders or perfumes, deodorant             Do not wear nail polish.  Do not shave  48 hours prior to surgery.     Do not bring valuables to the hospital. [place].  Contacts, dentures or bridgework may not be worn into surgery.  Leave suitcase in the car. After surgery it may be brought to your room.            CLEAR LIQUID DIET   Foods Allowed                                                                     Foods Excluded  Coffee and tea, regular and decaf                             liquids that you cannot  Plain Jell-O in any flavor                                             see through such as: Fruit ices (not with fruit pulp)                                     milk, soups, orange juice  Iced Popsicles                                    All solid food Carbonated beverages, regular and diet                                    Cranberry,  grape and apple juices Sports drinks like Gatorade Lightly seasoned clear broth or consume(fat free) Sugar, honey syrup  Sample Menu Breakfast                                Lunch                                     Supper Cranberry juice                     Beef broth                            Chicken broth Jell-O                                     Grape juice                           Apple juice Coffee or tea                        Jell-O                                      Popsicle                                                Coffee or tea                        Coffee or tea  _____________________________________________________________________                Please read over the following fact sheets you were given: _____________________________________________________________________             [facility] - Preparing for Surgery Before surgery, you can play an important role.  Because skin is not sterile, your skin needs to be as free of germs as possible.  You can reduce the number of germs on your skin by washing with CHG (chlorahexidine gluconate) soap before surgery.  CHG is an antiseptic cleaner which kills germs and bonds with the skin to continue killing germs even after washing. Please DO NOT use if you have an allergy to CHG or antibacterial soaps.  If your skin becomes reddened/irritated stop using the CHG and inform your nurse when you arrive at Short Stay. Do not shave (including legs and underarms) for at least 48 hours prior to the first CHG shower.  You may shave your face/neck. Please follow these instructions carefully:  1.  Shower with CHG Soap the night before surgery and the  morning of Surgery.  2.  If you choose to wash your hair, wash your hair first as usual with your  normal  shampoo.  3.  After you shampoo, rinse your hair and body thoroughly to remove the  shampoo.  4.  Use CHG as you would any other liquid soap.  You can apply chg directly  to the skin and wash                       Gently with a scrungie or clean washcloth.  5.  Apply the CHG Soap to your body ONLY FROM THE NECK DOWN.   Do not use on face/ open                           Wound or open sores.  Avoid contact with eyes, ears mouth and genitals (private parts).                       Wash face,  Genitals (private parts) with your normal soap.             6.  Wash thoroughly, paying special attention to the area where your surgery  will be performed.  7.  Thoroughly rinse your body with warm water from the neck down.  8.  DO NOT shower/wash with your normal soap after using and rinsing off  the CHG Soap.                9.  Pat yourself dry with a clean towel.            10.  Wear clean pajamas.            11.  Place clean sheets on your bed the night of your first shower and do not  sleep with pets. Day of Surgery : Do not apply any lotions/deodorants the morning of surgery.  Please wear clean clothes to the hospital/surgery center.  FAILURE TO FOLLOW THESE INSTRUCTIONS MAY RESULT IN THE CANCELLATION OF YOUR SURGERY PATIENT SIGNATURE_________________________________  NURSE SIGNATURE__________________________________  ________________________________________________________________________

## 2017-01-23 ENCOUNTER — Encounter (HOSPITAL_COMMUNITY)
Admission: RE | Admit: 2017-01-23 | Discharge: 2017-01-23 | Disposition: A | Discharge status: Home / Self Care | Payer: Medicare Other | Source: Ambulatory Visit | Attending: General Surgery | Admitting: General Surgery

## 2017-01-23 ENCOUNTER — Encounter (HOSPITAL_COMMUNITY): Payer: Self-pay

## 2017-01-23 DIAGNOSIS — K432 Incisional hernia without obstruction or gangrene: Secondary | ICD-10-CM | POA: Insufficient documentation

## 2017-01-23 DIAGNOSIS — Z01818 Encounter for other preprocedural examination: Secondary | ICD-10-CM | POA: Insufficient documentation

## 2017-01-23 DIAGNOSIS — Z01812 Encounter for preprocedural laboratory examination: Secondary | ICD-10-CM | POA: Insufficient documentation

## 2017-01-23 DIAGNOSIS — R001 Bradycardia, unspecified: Secondary | ICD-10-CM | POA: Insufficient documentation

## 2017-01-23 LAB — COMPREHENSIVE METABOLIC PANEL
ALBUMIN: 4.2 g/dL (ref 3.5–5.0)
ALT: 12 U/L — ABNORMAL LOW (ref 14–54)
ANION GAP: 8 (ref 5–15)
AST: 17 U/L (ref 15–41)
Alkaline Phosphatase: 75 U/L (ref 38–126)
BILIRUBIN TOTAL: 1 mg/dL (ref 0.3–1.2)
BUN: 17 mg/dL (ref 6–20)
CO2: 26 mmol/L (ref 22–32)
Calcium: 8.9 mg/dL (ref 8.9–10.3)
Chloride: 105 mmol/L (ref 101–111)
Creatinine, Ser: 0.76 mg/dL (ref 0.44–1.00)
GFR calc Af Amer: >60 mL/min (ref >60)
GFR calc non Af Amer: >60 mL/min (ref >60)
GLUCOSE: 131 mg/dL — AB (ref 65–99)
POTASSIUM: 4.1 mmol/L (ref 3.5–5.1)
SODIUM: 139 mmol/L (ref 135–145)
TOTAL PROTEIN: 7.3 g/dL (ref 6.5–8.1)

## 2017-01-23 LAB — CBC WITH DIFFERENTIAL/PLATELET
BASOS ABS: 0 10*3/uL (ref 0.0–0.1)
BASOS PCT: 0 %
EOS ABS: 0.1 10*3/uL (ref 0.0–0.7)
Eosinophils Relative: 2 %
HEMATOCRIT: 42.6 % (ref 36.0–46.0)
Hemoglobin: 14.4 g/dL (ref 12.0–15.0)
Lymphocytes Relative: 31 %
Lymphs Abs: 2.7 10*3/uL (ref 0.7–4.0)
MCH: 30.4 pg (ref 26.0–34.0)
MCHC: 33.8 g/dL (ref 30.0–36.0)
MCV: 90.1 fL (ref 78.0–100.0)
Monocytes Absolute: 0.8 10*3/uL (ref 0.1–1.0)
Monocytes Relative: 9 %
NEUTROS PCT: 58 %
Neutro Abs: 5.2 10*3/uL (ref 1.7–7.7)
Platelets: 240 10*3/uL (ref 150–400)
RBC: 4.73 MIL/uL (ref 3.87–5.11)
RDW: 13.1 % (ref 11.5–15.5)
WBC: 8.8 10*3/uL (ref 4.0–10.5)

## 2017-01-28 ENCOUNTER — Inpatient Hospital Stay (HOSPITAL_COMMUNITY)
Admission: RE | Admit: 2017-01-28 | Discharge: 2017-01-29 | DRG: 355 | Disposition: A | Discharge status: Home / Self Care | Payer: Medicare Other | Source: Ambulatory Visit | Attending: General Surgery | Admitting: General Surgery

## 2017-01-28 ENCOUNTER — Encounter (HOSPITAL_COMMUNITY)
Admission: RE | Disposition: A | Discharge status: Home / Self Care | Payer: Self-pay | Source: Ambulatory Visit | Attending: General Surgery

## 2017-01-28 ENCOUNTER — Encounter (HOSPITAL_COMMUNITY): Payer: Self-pay | Admitting: *Deleted

## 2017-01-28 ENCOUNTER — Ambulatory Visit (HOSPITAL_COMMUNITY): Payer: Medicare Other | Admitting: Certified Registered"

## 2017-01-28 DIAGNOSIS — Z79899 Other long term (current) drug therapy: Secondary | ICD-10-CM

## 2017-01-28 DIAGNOSIS — Z885 Allergy status to narcotic agent status: Secondary | ICD-10-CM

## 2017-01-28 DIAGNOSIS — K649 Unspecified hemorrhoids: Secondary | ICD-10-CM | POA: Diagnosis not present

## 2017-01-28 DIAGNOSIS — Z981 Arthrodesis status: Secondary | ICD-10-CM

## 2017-01-28 DIAGNOSIS — K432 Incisional hernia without obstruction or gangrene: Secondary | ICD-10-CM | POA: Diagnosis not present

## 2017-01-28 DIAGNOSIS — Z888 Allergy status to other drugs, medicaments and biological substances status: Secondary | ICD-10-CM

## 2017-01-28 DIAGNOSIS — K219 Gastro-esophageal reflux disease without esophagitis: Secondary | ICD-10-CM | POA: Diagnosis present

## 2017-01-28 DIAGNOSIS — Z87891 Personal history of nicotine dependence: Secondary | ICD-10-CM | POA: Diagnosis not present

## 2017-01-28 DIAGNOSIS — I10 Essential (primary) hypertension: Secondary | ICD-10-CM | POA: Diagnosis present

## 2017-01-28 DIAGNOSIS — Z886 Allergy status to analgesic agent status: Secondary | ICD-10-CM

## 2017-01-28 DIAGNOSIS — E785 Hyperlipidemia, unspecified: Secondary | ICD-10-CM | POA: Diagnosis not present

## 2017-01-28 DIAGNOSIS — F419 Anxiety disorder, unspecified: Secondary | ICD-10-CM | POA: Diagnosis present

## 2017-01-28 HISTORY — PX: INCISIONAL HERNIA REPAIR: SHX193

## 2017-01-28 HISTORY — PX: INSERTION OF MESH: SHX5868

## 2017-01-28 SURGERY — REPAIR, HERNIA, INCISIONAL, LAPAROSCOPIC
Anesthesia: General | Site: Abdomen | Laterality: Right

## 2017-01-28 MED ORDER — PROPOFOL 10 MG/ML IV BOLUS
INTRAVENOUS | Status: DC | PRN
  Administered 2017-01-28: 180 mg via INTRAVENOUS

## 2017-01-28 MED ORDER — FENTANYL CITRATE (PF) 100 MCG/2ML IJ SOLN
25.0000 ug | INTRAMUSCULAR | Status: DC | PRN
  Administered 2017-01-28: 50 ug via INTRAVENOUS
  Administered 2017-01-28 (×2): 25 ug via INTRAVENOUS
  Administered 2017-01-28 (×2): 50 ug via INTRAVENOUS

## 2017-01-28 MED ORDER — DEXAMETHASONE SODIUM PHOSPHATE 10 MG/ML IJ SOLN
INTRAMUSCULAR | Status: DC | PRN
  Administered 2017-01-28: 10 mg via INTRAVENOUS

## 2017-01-28 MED ORDER — FENTANYL CITRATE (PF) 100 MCG/2ML IJ SOLN: 25.0000 ug | INTRAMUSCULAR | Status: DC | PRN

## 2017-01-28 MED ORDER — FENTANYL CITRATE (PF) 100 MCG/2ML IJ SOLN
INTRAMUSCULAR | Status: AC
  Filled 2017-01-28: qty 2

## 2017-01-28 MED ORDER — PAROXETINE HCL 20 MG PO TABS
40.0000 mg | ORAL_TABLET | Freq: Every evening | ORAL | Status: DC
  Administered 2017-01-28: 40 mg via ORAL
  Filled 2017-01-28: qty 2

## 2017-01-28 MED ORDER — ALPRAZOLAM 1 MG PO TABS: 1.0000 mg | ORAL_TABLET | Freq: Two times a day (BID) | ORAL | Status: DC | PRN

## 2017-01-28 MED ORDER — ROCURONIUM BROMIDE 50 MG/5ML IV SOSY
PREFILLED_SYRINGE | INTRAVENOUS | Status: DC | PRN
  Administered 2017-01-28: 5 mg via INTRAVENOUS
  Administered 2017-01-28: 40 mg via INTRAVENOUS
  Administered 2017-01-28 (×2): 10 mg via INTRAVENOUS

## 2017-01-28 MED ORDER — ONDANSETRON HCL 4 MG/2ML IJ SOLN
INTRAMUSCULAR | Status: DC | PRN
  Administered 2017-01-28: 4 mg via INTRAVENOUS

## 2017-01-28 MED ORDER — BUPIVACAINE HCL (PF) 0.5 % IJ SOLN
INTRAMUSCULAR | Status: AC
  Filled 2017-01-28: qty 30

## 2017-01-28 MED ORDER — DIPHENHYDRAMINE HCL 50 MG/ML IJ SOLN: 25.0000 mg | Freq: Four times a day (QID) | INTRAMUSCULAR | Status: DC | PRN

## 2017-01-28 MED ORDER — BENAZEPRIL HCL 10 MG PO TABS
20.0000 mg | ORAL_TABLET | Freq: Every day | ORAL | Status: DC
  Administered 2017-01-29: 20 mg via ORAL
  Filled 2017-01-28: qty 2

## 2017-01-28 MED ORDER — SUGAMMADEX SODIUM 200 MG/2ML IV SOLN
INTRAVENOUS | Status: AC
  Filled 2017-01-28: qty 2

## 2017-01-28 MED ORDER — PANTOPRAZOLE SODIUM 40 MG PO TBEC
40.0000 mg | DELAYED_RELEASE_TABLET | Freq: Every day | ORAL | Status: DC
  Administered 2017-01-29: 40 mg via ORAL
  Filled 2017-01-28: qty 1

## 2017-01-28 MED ORDER — ONDANSETRON HCL 4 MG/2ML IJ SOLN: 4.0000 mg | Freq: Once | INTRAMUSCULAR | Status: DC | PRN

## 2017-01-28 MED ORDER — ONDANSETRON HCL 4 MG/2ML IJ SOLN
4.0000 mg | Freq: Once | INTRAMUSCULAR | Status: DC | PRN
Start: 2017-01-28 — End: 2017-01-28

## 2017-01-28 MED ORDER — AMLODIPINE BESYLATE 10 MG PO TABS
10.0000 mg | ORAL_TABLET | Freq: Every day | ORAL | Status: DC
  Administered 2017-01-29: 10 mg via ORAL
  Filled 2017-01-28: qty 1

## 2017-01-28 MED ORDER — EPHEDRINE SULFATE-NACL 50-0.9 MG/10ML-% IV SOSY
PREFILLED_SYRINGE | INTRAVENOUS | Status: DC | PRN
  Administered 2017-01-28 (×3): 10 mg via INTRAVENOUS
  Administered 2017-01-28: 5 mg via INTRAVENOUS
  Administered 2017-01-28 (×2): 10 mg via INTRAVENOUS
  Administered 2017-01-28: 5 mg via INTRAVENOUS
  Administered 2017-01-28: 10 mg via INTRAVENOUS
  Administered 2017-01-28: 5 mg via INTRAVENOUS
  Administered 2017-01-28: 10 mg via INTRAVENOUS

## 2017-01-28 MED ORDER — PHENYLEPHRINE 40 MCG/ML (10ML) SYRINGE FOR IV PUSH (FOR BLOOD PRESSURE SUPPORT)
PREFILLED_SYRINGE | INTRAVENOUS | Status: DC | PRN
  Administered 2017-01-28: 200 ug via INTRAVENOUS
  Administered 2017-01-28: 120 ug via INTRAVENOUS
  Administered 2017-01-28: 200 ug via INTRAVENOUS
  Administered 2017-01-28: 80 ug via INTRAVENOUS

## 2017-01-28 MED ORDER — DEXAMETHASONE SODIUM PHOSPHATE 10 MG/ML IJ SOLN
INTRAMUSCULAR | Status: AC
  Filled 2017-01-28: qty 1

## 2017-01-28 MED ORDER — ONDANSETRON HCL 4 MG/2ML IJ SOLN: 4.0000 mg | INTRAMUSCULAR | Status: DC | PRN

## 2017-01-28 MED ORDER — SUGAMMADEX SODIUM 200 MG/2ML IV SOLN
INTRAVENOUS | Status: DC | PRN
  Administered 2017-01-28: 200 mg via INTRAVENOUS

## 2017-01-28 MED ORDER — LACTATED RINGERS IV SOLN
INTRAVENOUS | Status: DC
  Administered 2017-01-28 (×3): via INTRAVENOUS

## 2017-01-28 MED ORDER — METHOCARBAMOL 500 MG PO TABS
500.0000 mg | ORAL_TABLET | Freq: Four times a day (QID) | ORAL | Status: DC
  Administered 2017-01-28 – 2017-01-29 (×3): 500 mg via ORAL
  Filled 2017-01-28 (×3): qty 1

## 2017-01-28 MED ORDER — CHLORHEXIDINE GLUCONATE CLOTH 2 % EX PADS: 6.0000 | MEDICATED_PAD | Freq: Once | CUTANEOUS | Status: DC

## 2017-01-28 MED ORDER — ENOXAPARIN SODIUM 40 MG/0.4ML ~~LOC~~ SOLN
40.0000 mg | SUBCUTANEOUS | Status: DC
  Administered 2017-01-29: 40 mg via SUBCUTANEOUS
  Filled 2017-01-28: qty 0.4

## 2017-01-28 MED ORDER — LIDOCAINE 2% (20 MG/ML) 5 ML SYRINGE
INTRAMUSCULAR | Status: DC | PRN
  Administered 2017-01-28: 40 mg via INTRAVENOUS
  Administered 2017-01-28: 60 mg via INTRAVENOUS

## 2017-01-28 MED ORDER — FENTANYL CITRATE (PF) 250 MCG/5ML IJ SOLN
INTRAMUSCULAR | Status: DC | PRN
  Administered 2017-01-28 (×2): 100 ug via INTRAVENOUS
  Administered 2017-01-28: 50 ug via INTRAVENOUS

## 2017-01-28 MED ORDER — PHENYLEPHRINE 40 MCG/ML (10ML) SYRINGE FOR IV PUSH (FOR BLOOD PRESSURE SUPPORT)
PREFILLED_SYRINGE | INTRAVENOUS | Status: AC
  Filled 2017-01-28: qty 10

## 2017-01-28 MED ORDER — ONDANSETRON HCL 4 MG/2ML IJ SOLN
INTRAMUSCULAR | Status: AC
  Filled 2017-01-28: qty 2

## 2017-01-28 MED ORDER — EPHEDRINE 5 MG/ML INJ
INTRAVENOUS | Status: AC
  Filled 2017-01-28: qty 10

## 2017-01-28 MED ORDER — CEFAZOLIN SODIUM-DEXTROSE 2-4 GM/100ML-% IV SOLN
INTRAVENOUS | Status: AC
  Filled 2017-01-28: qty 100

## 2017-01-28 MED ORDER — MIDAZOLAM HCL 2 MG/2ML IJ SOLN
INTRAMUSCULAR | Status: AC
  Filled 2017-01-28: qty 2

## 2017-01-28 MED ORDER — MIDAZOLAM HCL 2 MG/2ML IJ SOLN
INTRAMUSCULAR | Status: DC | PRN
  Administered 2017-01-28 (×2): 2 mg via INTRAVENOUS

## 2017-01-28 MED ORDER — CEFAZOLIN SODIUM-DEXTROSE 2-3 GM-% IV SOLR
INTRAVENOUS | Status: DC | PRN
  Administered 2017-01-28: 2 g via INTRAVENOUS

## 2017-01-28 MED ORDER — ROCURONIUM BROMIDE 50 MG/5ML IV SOSY
PREFILLED_SYRINGE | INTRAVENOUS | Status: AC
  Filled 2017-01-28: qty 5

## 2017-01-28 MED ORDER — LIDOCAINE 2% (20 MG/ML) 5 ML SYRINGE
INTRAMUSCULAR | Status: AC
  Filled 2017-01-28: qty 5

## 2017-01-28 MED ORDER — SUCCINYLCHOLINE CHLORIDE 200 MG/10ML IV SOSY
PREFILLED_SYRINGE | INTRAVENOUS | Status: DC | PRN
  Administered 2017-01-28: 140 mg via INTRAVENOUS

## 2017-01-28 MED ORDER — CEFAZOLIN SODIUM-DEXTROSE 2-4 GM/100ML-% IV SOLN: 2.0000 g | INTRAVENOUS | Status: DC

## 2017-01-28 MED ORDER — BUPIVACAINE LIPOSOME 1.3 % IJ SUSP
20.0000 mL | Freq: Once | INTRAMUSCULAR | Status: AC
  Administered 2017-01-28: 19 mL
  Filled 2017-01-28: qty 20

## 2017-01-28 MED ORDER — CEFAZOLIN SODIUM-DEXTROSE 2-4 GM/100ML-% IV SOLN
2.0000 g | Freq: Three times a day (TID) | INTRAVENOUS | Status: AC
  Administered 2017-01-28: 2 g via INTRAVENOUS
  Filled 2017-01-28: qty 100

## 2017-01-28 MED ORDER — HYDROMORPHONE HCL 1 MG/ML IJ SOLN
0.5000 mg | INTRAMUSCULAR | Status: DC | PRN
  Administered 2017-01-28 – 2017-01-29 (×3): 1 mg via INTRAVENOUS
  Filled 2017-01-28 (×3): qty 1

## 2017-01-28 MED ORDER — FENTANYL CITRATE (PF) 100 MCG/2ML IJ SOLN
INTRAMUSCULAR | Status: DC | PRN
  Administered 2017-01-28 (×2): 50 ug via INTRAVENOUS

## 2017-01-28 MED ORDER — SUCCINYLCHOLINE CHLORIDE 200 MG/10ML IV SOSY
PREFILLED_SYRINGE | INTRAVENOUS | Status: AC
  Filled 2017-01-28: qty 10

## 2017-01-28 MED ORDER — BUPIVACAINE HCL (PF) 0.5 % IJ SOLN
INTRAMUSCULAR | Status: DC | PRN
  Administered 2017-01-28: 20 mL

## 2017-01-28 MED ORDER — FENTANYL CITRATE (PF) 250 MCG/5ML IJ SOLN
INTRAMUSCULAR | Status: AC
  Filled 2017-01-28: qty 5

## 2017-01-28 MED ORDER — ONDANSETRON 4 MG PO TBDP: 4.0000 mg | ORAL_TABLET | Freq: Four times a day (QID) | ORAL | Status: DC | PRN

## 2017-01-28 MED ORDER — KCL IN DEXTROSE-NACL 20-5-0.9 MEQ/L-%-% IV SOLN
INTRAVENOUS | Status: DC
  Administered 2017-01-28: 75 mL/h via INTRAVENOUS
  Filled 2017-01-28 (×2): qty 1000

## 2017-01-28 MED ORDER — 0.9 % SODIUM CHLORIDE (POUR BTL) OPTIME
TOPICAL | Status: DC | PRN
  Administered 2017-01-28: 1000 mL

## 2017-01-28 SURGICAL SUPPLY — 46 items
APL SKNCLS STERI-STRIP NONHPOA (GAUZE/BANDAGES/DRESSINGS) ×1
BANDAGE ADH SHEER 1  50/CT (GAUZE/BANDAGES/DRESSINGS) IMPLANT
BENZOIN TINCTURE PRP APPL 2/3 (GAUZE/BANDAGES/DRESSINGS) ×4 IMPLANT
BINDER ABDOMINAL 12 ML 46-62 (SOFTGOODS) ×4 IMPLANT
CABLE HIGH FREQUENCY MONO STRZ (ELECTRODE) ×4 IMPLANT
CLOSURE WOUND 1/2 X4 (GAUZE/BANDAGES/DRESSINGS) ×1
DECANTER SPIKE VIAL GLASS SM (MISCELLANEOUS) IMPLANT
DEVICE TROCAR PUNCTURE CLOSURE (ENDOMECHANICALS) ×4 IMPLANT
DISSECTOR BLUNT TIP ENDO 5MM (MISCELLANEOUS) IMPLANT
DRAPE INCISE IOBAN 66X45 STRL (DRAPES) IMPLANT
DRSG TEGADERM 2-3/8X2-3/4 SM (GAUZE/BANDAGES/DRESSINGS) ×4 IMPLANT
ELECT REM PT RETURN 15FT ADLT (MISCELLANEOUS) ×4 IMPLANT
GAUZE SPONGE 2X2 8PLY STRL LF (GAUZE/BANDAGES/DRESSINGS) ×2 IMPLANT
GAUZE SPONGE 4X4 12PLY STRL (GAUZE/BANDAGES/DRESSINGS) ×4 IMPLANT
GLOVE ECLIPSE 8.0 STRL XLNG CF (GLOVE) ×4 IMPLANT
GLOVE INDICATOR 8.0 STRL GRN (GLOVE) ×4 IMPLANT
GOWN STRL REUS W/TWL XL LVL3 (GOWN DISPOSABLE) ×24 IMPLANT
IRRIG SUCT STRYKERFLOW 2 WTIP (MISCELLANEOUS)
IRRIGATION SUCT STRKRFLW 2 WTP (MISCELLANEOUS) IMPLANT
KIT BASIN OR (CUSTOM PROCEDURE TRAY) ×4 IMPLANT
MARKER SKIN DUAL TIP RULER LAB (MISCELLANEOUS) ×4 IMPLANT
MESH HERNIA 3X6 (Mesh General) ×4 IMPLANT
NEEDLE SPNL 22GX3.5 QUINCKE BK (NEEDLE) ×4 IMPLANT
PAD POSITIONING PINK XL (MISCELLANEOUS) IMPLANT
SCISSORS LAP 5X35 DISP (ENDOMECHANICALS) ×4 IMPLANT
SHEARS HARMONIC ACE PLUS 36CM (ENDOMECHANICALS) ×4 IMPLANT
SLEEVE XCEL OPT CAN 5 100 (ENDOMECHANICALS) ×8 IMPLANT
SPONGE GAUZE 2X2 STER 10/PKG (GAUZE/BANDAGES/DRESSINGS) ×2
SPONGE LAP 18X18 X RAY DECT (DISPOSABLE) ×4 IMPLANT
STAPLER VISISTAT 35W (STAPLE) ×4 IMPLANT
STRIP CLOSURE SKIN 1/2X4 (GAUZE/BANDAGES/DRESSINGS) ×3 IMPLANT
SUT MNCRL AB 3-0 PS2 18 (SUTURE) ×4 IMPLANT
SUT MNCRL AB 4-0 PS2 18 (SUTURE) ×4 IMPLANT
SUT NOVA 1 T20/GS 25DT (SUTURE) ×8 IMPLANT
SUT NOVA NAB DX-16 0-1 5-0 T12 (SUTURE) IMPLANT
SUT VIC AB 2-0 SH 27 (SUTURE) ×3
SUT VIC AB 2-0 SH 27X BRD (SUTURE) ×2 IMPLANT
TACKER 5MM HERNIA 3.5CML NAB (ENDOMECHANICALS) IMPLANT
TAPE CLOTH SURG 4X10 WHT LF (GAUZE/BANDAGES/DRESSINGS) ×4 IMPLANT
TOWEL OR 17X26 10 PK STRL BLUE (TOWEL DISPOSABLE) ×4 IMPLANT
TOWEL OR NON WOVEN STRL DISP B (DISPOSABLE) ×4 IMPLANT
TRAY LAPAROSCOPIC (CUSTOM PROCEDURE TRAY) ×4 IMPLANT
TROCAR BLADELESS OPT 5 100 (ENDOMECHANICALS) ×4 IMPLANT
TROCAR XCEL BLUNT TIP 100MML (ENDOMECHANICALS) IMPLANT
TROCAR XCEL NON-BLD 11X100MML (ENDOMECHANICALS) IMPLANT
TUBING INSUF HEATED (TUBING) ×4 IMPLANT

## 2017-01-28 NOTE — Anesthesia Postprocedure Evaluation (Addendum)
Anesthesia Post Note  Patient: Lynn [name]  Procedure(s) Performed: Procedure(s) (LRB): LAPAROSCOPIC REPAIR RIGH FLANK  LUMBAR  INCISIONAL HERNIA WITH MESH (Right) INSERTION OF MESH (Right)  Patient location during evaluation: PACU Anesthesia Type: General Level of consciousness: awake, awake and alert and oriented Pain management: pain level controlled Vital Signs Assessment: post-procedure vital signs reviewed and stable Respiratory status: spontaneous breathing, nonlabored ventilation and respiratory function stable Cardiovascular status: blood pressure returned to baseline Anesthetic complications: no       Last Vitals:  Vitals:   01/28/17 1730 01/28/17 1734  BP: (!) 109/58   Pulse: 94   Resp: 18   Temp:  36.7 C    Last Pain:  Vitals:   01/28/17 1734  TempSrc:   PainSc: 6                  Catheryn Slifer COKER

## 2017-01-28 NOTE — H&P (View-Only) (Signed)
Lynn [name] 01/20/2017 11:11 AM Location: [facility] Patient #: [id number] DOB: 1950/12/29 Single / Language: Lynn [name] / Race: White Female  History of Present Illness Lynn Hollingshead MD; 01/20/2017 11:37 AM) The patient is a 66 year old female.   Note:She presents today because she is ready to schedule her right flank/lumbar incisional hernia repair. She says her mother is doing much better. She has family to help her postoperatively. She has stopped smoking. She still having some discomfort from the hernia. I recommended she get a thin abdominal binder for that but she has not purchased that yet.  Allergies (April Staton, CMA; 01/20/2017 11:11 AM) Diclofenac *ANALGESICS - ANTI-INFLAMMATORY* Hives Hydrocodone-Acetaminophen *ANALGESICS - OPIOID* itching, nausea, vomiting Lipitor *ANTIHYPERLIPIDEMICS* Itching Niacin (Antihyperlipidemic) *ANTIHYPERLIPIDEMICS* Rash Zolpidem Tartrate *HYPNOTICS/SEDATIVES/SLEEP DISORDER AGENTS* Hives Allergies Reconciled  Medication History (April Staton, CMA; 01/20/2017 11:11 AM) ALPRAZolam (1MG  Tablet, Oral daily) Active. AmLODIPine Besylate (10MG  Tablet, Oral daily) Active. Benazepril HCl (20MG  Tablet, Oral daily) Active. PARoxetine HCl (40MG  Tablet, Oral daily) Active. Pravastatin Sodium (80MG  Tablet, Oral daily) Active. Vitamin D (Cholecalciferol) (1000UNIT Capsule, Oral daily) Active. Cyclobenzaprine HCl (10MG  Tablet, Oral as needed) Active. NexIUM (40MG  Capsule DR, Oral daily) Active. Medications Reconciled    Vitals (April Staton CMA; 01/20/2017 11:12 AM) 01/20/2017 11:11 AM Weight: 190.25 lb Height: 61in Body Surface Area: 1.85 m Body Mass Index: 35.95 kg/m  Temp.: 98.9F(Oral)  Pulse: 90 (Regular)  BP: 136/60 (Sitting, Left Arm, Standard)      Physical Exam Lynn Hollingshead MD; 01/20/2017 11:34 AM)  The physical exam findings are as follows: Note:General-oh obese female in no acute  distress.  Abdomen-long midline scar with absence of umbilicus, small right flank incision with palpable bulge that is reducible.    Assessment & Plan Lynn Hollingshead MD; 01/20/2017 11:35 AM)  [name] HERNIA, WITHOUT OBSTRUCTION OR GANGRENE (K43.2) Impression: She is ready to schedule repair.  Plan: Laparoscopic possible open repair. We'll start the scheduling process.  [name], [name].

## 2017-01-28 NOTE — Anesthesia Procedure Notes (Signed)
Procedure Name: Intubation Date/Time: 01/28/2017 1:31 PM Performed by: Cynda Familia Pre-anesthesia Checklist: Patient identified, Emergency Drugs available, Suction available and Patient being monitored Patient Re-evaluated:Patient Re-evaluated prior to inductionOxygen Delivery Method: Circle System Utilized Preoxygenation: Pre-oxygenation with 100% oxygen Intubation Type: IV induction Ventilation: Mask ventilation without difficulty Laryngoscope Size: Miller and 2 Grade View: Grade I Tube type: Oral Number of attempts: 1 Airway Equipment and Method: Stylet Placement Confirmation: ETT inserted through vocal cords under direct vision,  positive ETCO2 and breath sounds checked- equal and bilateral Secured at: 22 cm Tube secured with: Tape Dental Injury: Teeth and Oropharynx as per pre-operative assessment  Comments: Smooth IV induction Joslin--- intubation AM CRNA  Atraumatic--- teeth and mouth as preop--- many chipped teeth with paper thin teeth --- teeth as prior to laryngoscopy--- Joslin --- bilat BS

## 2017-01-28 NOTE — Op Note (Addendum)
Operative Note  Lynn [name] female 66 y.o. 01/28/2017  PREOPERATIVE DX:  Right lumbar incisional hernia  POSTOPERATIVE DX:  Same  PROCEDURE:   Laparoscopic assisted right lumbar incisional hernia repair with mesh          Surgeon: Odis Hollingshead   Assistants: Greer Pickerel, [name].  Indication for assistant:  Need for exposure and retraction to allow for lysis of adhesions and implantation of mesh.  Anesthesia: General endotracheal anesthesia  Indications:   This is a 66 year old female who an anterolateral lumbar fusion in 2014.  She developed a symptomatic incisional lumbar hernia and now presents for repair.    Procedure Detail:  She was brought to the operating room placed supine on the operating table and a general anesthetic was administered. She was then turned into the left lateral decubitus disposition at a 45 angle. The right back, right flank, and right and midline abdominal wall was sterilely prepped and draped. A timeout was performed.  A small incision was made inferior to the umbilicus. Subcutaneous tissue was dissected bluntly. Anterior fascia was grasped and a small incision made in it. A pursestring suture of 0 Vicryl was placed around the fascial edges. It appeared I had another layer of tissue to go before entering the peritoneal cavity. I did not want to do this blindly as she had a previous upper midline laparotomy. Because of that, a 5 mm incision was made in the right upper quadrant. Using a 5 mm Optiview trocar and laparoscope, access was gained into the peritoneal cavity and a pneumoperitoneum was created. Visualization of the area underneath the trocar demonstrated no evidence of organ injury or bleeding. There were some adhesions between the omentum and anterior abdominal wall that I was able to take down using the laparoscope. I was able to visualize putting the [facility] trocar into the peritoneal cavity through the subumbilical incision. Using Harmonic  scalpel I freed up adhesions of the omentum to the abdominal wall in the right abdomen and flank areas. I then placed a 5 mm trocar in the lower epigastric midline and another one in the right lower quadrant. I mobilized the right colon medially and exposed the lateral aspect of the hernia. I reduced fat from the hernia and dissected this and part of the sac free from the surrounding muscle. I was able to get approximately 4 cm in all directions except posteriorly where the psoas muscle was densely adherent to this area. This would not allow me to lay the mesh in with overlap in this area.  I then made a limited incision over the area of the hernia in the right flank/lumbar area and carried this down to subcutaneous tissue. I debrided the hernia sac with electrocautery. I then primarily closed the hernia defect with interrupted #1 Novafil sutures. These were left long. Repeat laparoscopy was performed and the repair appeared solid. Exparel was injected into the tissues around the primary repair. The [facility] trocar was removed and the fascia at this site was closed by tying down the pursestring suture. Inspection of the abdominal cavity demonstrated no evidence of organ injury or bleeding. The rest of the trocars were removed.  Next, a piece of 3" x 6" polypropylene mesh was brought into the field. The primary repair sutures were threaded up through the mesh and tied down and can the mesh directly over the primary repair. The periphery of the mesh was anchored to the surrounding fascia with running 0 Prolene suture. Excess mesh was  removed.  The wound was irrigated and hemostasis was adequate. The subcutaneous tissues closed over the mesh with a running 2-0 Vicryl suture. The skin incision was closed with a running 3-0 Monocryl subcuticular stitch. The trocar site skin incisions were closed with 4-0 Monocryl subcuticular stitches. Steri-Strips and sterile dressings were applied to all wounds.  She tolerated  the procedure well without any apparent complications and was taken to the recovery room in satisfactory condition.  Estimated Blood Loss:  445 ml        Complications:  * No complications entered in OR log *         Disposition: PACU - hemodynamically stable.         Condition: stable

## 2017-01-28 NOTE — Anesthesia Preprocedure Evaluation (Addendum)
Anesthesia Evaluation  Patient identified by MRN, date of birth, ID band Patient awake    Reviewed: Allergy & Precautions, NPO status , Patient's Chart, lab work & pertinent test results  Airway Mallampati: II  TM Distance: >3 FB Neck ROM: Full    Dental  (+) Dental Advisory Given, Missing, Poor Dentition, Chipped   Pulmonary former smoker,  Not using CPAP at home   breath sounds clear to auscultation       Cardiovascular hypertension, Pt. on medications  Rhythm:Regular Rate:Normal     Neuro/Psych Anxiety    GI/Hepatic hiatal hernia, GERD  Medicated and Controlled,  Endo/Other    Renal/GU      Musculoskeletal   Abdominal   Peds  Hematology   Anesthesia Other Findings   Reproductive/Obstetrics                            Anesthesia Physical Anesthesia Plan  ASA: III  Anesthesia Plan: General   Post-op Pain Management:    Induction: Intravenous  Airway Management Planned: Oral ETT  Additional Equipment:   Intra-op Plan:   Post-operative Plan: Extubation in OR  Informed Consent: I have reviewed the patients History and Physical, chart, labs and discussed the procedure including the risks, benefits and alternatives for the proposed anesthesia with the patient or authorized representative who has indicated his/her understanding and acceptance.   Dental advisory given  Plan Discussed with: CRNA, Anesthesiologist and Surgeon  Anesthesia Plan Comments:        Anesthesia Quick Evaluation

## 2017-01-28 NOTE — Interval H&P Note (Signed)
History and Physical Interval Note:  01/28/2017 12:53 PM  Lynn [name]  has presented today for surgery, with the diagnosis of right flank lumbar incisional hernia  The various methods of treatment have been discussed with the patient and family. After consideration of risks, benefits and other options for treatment, the patient has consented to  Procedure(s): LAPAROSCOPIC POSSIBLE OPEN REPAIR RIGH FLANK  LUMBAR  INCISIONAL HERNIA WITH MESH (Right) INSERTION OF MESH (Right) as a surgical intervention .  The patient's history has been reviewed, patient examined, no change in status, stable for surgery.  I have reviewed the patient's chart and labs.  Questions were answered to the patient's satisfaction.     Fleda Pagel Lenna Sciara

## 2017-01-28 NOTE — Transfer of Care (Signed)
Immediate Anesthesia Transfer of Care Note  Patient: Lynn [name]  Procedure(s) Performed: Procedure(s): LAPAROSCOPIC REPAIR RIGH FLANK  LUMBAR  INCISIONAL HERNIA WITH MESH (Right) INSERTION OF MESH (Right)  Patient Location: PACU  Anesthesia Type:General  Level of Consciousness: awake and alert   Airway & Oxygen Therapy: Patient Spontanous Breathing and Patient connected to face mask oxygen  Post-op Assessment: Report given to RN and Post -op Vital signs reviewed and stable  Post vital signs: Reviewed and stable  Last Vitals:  Vitals:   01/28/17 1120  BP: (!) 155/86  Pulse: 70  Resp: 16  Temp: 36.7 C    Last Pain:  Vitals:   01/28/17 1120  TempSrc: Oral      Patients Stated Pain Goal: 3 (26/20/35 5974)  Complications: No apparent anesthesia complications

## 2017-01-29 DIAGNOSIS — K432 Incisional hernia without obstruction or gangrene: Secondary | ICD-10-CM | POA: Diagnosis not present

## 2017-01-29 DIAGNOSIS — Z981 Arthrodesis status: Secondary | ICD-10-CM | POA: Diagnosis not present

## 2017-01-29 DIAGNOSIS — K219 Gastro-esophageal reflux disease without esophagitis: Secondary | ICD-10-CM | POA: Diagnosis not present

## 2017-01-29 DIAGNOSIS — F419 Anxiety disorder, unspecified: Secondary | ICD-10-CM | POA: Diagnosis not present

## 2017-01-29 DIAGNOSIS — Z87891 Personal history of nicotine dependence: Secondary | ICD-10-CM | POA: Diagnosis not present

## 2017-01-29 DIAGNOSIS — I10 Essential (primary) hypertension: Secondary | ICD-10-CM | POA: Diagnosis not present

## 2017-01-29 LAB — BASIC METABOLIC PANEL
ANION GAP: 7 (ref 5–15)
BUN: 14 mg/dL (ref 6–20)
CO2: 26 mmol/L (ref 22–32)
Calcium: 8.4 mg/dL — ABNORMAL LOW (ref 8.9–10.3)
Chloride: 103 mmol/L (ref 101–111)
Creatinine, Ser: 0.8 mg/dL (ref 0.44–1.00)
GFR calc Af Amer: >60 mL/min (ref >60)
GLUCOSE: 179 mg/dL — AB (ref 65–99)
POTASSIUM: 4.3 mmol/L (ref 3.5–5.1)
Sodium: 136 mmol/L (ref 135–145)

## 2017-01-29 LAB — CBC
HEMATOCRIT: 38.1 % (ref 36.0–46.0)
Hemoglobin: 12.5 g/dL (ref 12.0–15.0)
MCH: 30 pg (ref 26.0–34.0)
MCHC: 32.8 g/dL (ref 30.0–36.0)
MCV: 91.6 fL (ref 78.0–100.0)
PLATELETS: 216 10*3/uL (ref 150–400)
RBC: 4.16 MIL/uL (ref 3.87–5.11)
RDW: 13.5 % (ref 11.5–15.5)
WBC: 14.5 10*3/uL — AB (ref 4.0–10.5)

## 2017-01-29 MED ORDER — METHOCARBAMOL 500 MG PO TABS: 500.0000 mg | ORAL_TABLET | Freq: Four times a day (QID) | ORAL | 0 refills | Status: DC | PRN

## 2017-01-29 MED ORDER — ACETAMINOPHEN 325 MG PO TABS: 650.0000 mg | ORAL_TABLET | Freq: Four times a day (QID) | ORAL | Status: DC | PRN

## 2017-01-29 MED ORDER — OXYCODONE HCL 5 MG PO TABS
5.0000 mg | ORAL_TABLET | ORAL | Status: DC | PRN
Start: 2017-01-29 — End: 2017-01-29
  Administered 2017-01-29: 10 mg via ORAL
  Administered 2017-01-29: 5 mg via ORAL
  Filled 2017-01-29: qty 1
  Filled 2017-01-29: qty 2

## 2017-01-29 MED ORDER — OXYCODONE HCL 5 MG PO TABS: 5.0000 mg | ORAL_TABLET | ORAL | 0 refills | Status: DC | PRN

## 2017-01-29 NOTE — Progress Notes (Signed)
Assessment Status Post Laparoscopic assisted right lumbar incisional hernia repair with mesh 01/28/17-doing well thus far; requiring IV analgesia to help with pain control   HTN-meds to restart; SBP borderline high  Anxiety disorder-meds restarted    Plan:  Advance diet as tolerated.  Ambulate. Start oral analgesic.   LOS: 1 day     1 Day Post-Op  Chief Complaint/Subjective: Sore.  Has been OOB.  Has not try liquids yet but has a breakfast tray in front of her.  Objective: Vital signs in last 24 hours: Temp:  [97.5 F (36.4 C)-98.6 F (37 C)] 97.7 F (36.5 C) (05/16 0454) Pulse Rate:  [70-100] 81 (05/16 0454) Resp:  [12-18] 18 (05/16 0454) BP: (88-155)/(55-86) 113/74 (05/16 0454) SpO2:  [90 %-100 %] 94 % (05/16 0454) Weight:  [82.1 kg (181 lb)] 82.1 kg (181 lb) (05/15 1254)    Intake/Output from previous day: 05/15 0701 - 05/16 0700 In: 2968.8 [I.V.:2968.8] Out: 1500 [Urine:1400; Blood:100] Intake/Output this shift: No intake/output data recorded.  PE: General- In NAD.  Awake and alert. Abdomen-soft, right flank dressing dry  Lab Results:   Recent Labs  01/29/17 0500  WBC 14.5*  HGB 12.5  HCT 38.1  PLT 216   BMET  Recent Labs  01/29/17 0500  NA 136  K 4.3  CL 103  CO2 26  GLUCOSE 179*  BUN 14  CREATININE 0.80  CALCIUM 8.4*   PT/INR No results for input(s): LABPROT, INR in the last 72 hours. Comprehensive Metabolic Panel:    Component Value Date/Time   NA 136 01/29/2017 0500   NA 139 01/23/2017 0947   K 4.3 01/29/2017 0500   K 4.1 01/23/2017 0947   CL 103 01/29/2017 0500   CL 105 01/23/2017 0947   CO2 26 01/29/2017 0500   CO2 26 01/23/2017 0947   BUN 14 01/29/2017 0500   BUN 17 01/23/2017 0947   CREATININE 0.80 01/29/2017 0500   CREATININE 0.76 01/23/2017 0947   CREATININE 0.66 10/14/2016 0850   CREATININE 0.65 04/10/2016 0957   GLUCOSE 179 (H) 01/29/2017 0500   GLUCOSE 131 (H) 01/23/2017 0947   CALCIUM 8.4 (L) 01/29/2017 0500   CALCIUM 8.9 01/23/2017 0947   AST 17 01/23/2017 0947   AST 14 10/14/2016 0850   ALT 12 (L) 01/23/2017 0947   ALT 9 10/14/2016 0850   ALKPHOS 75 01/23/2017 0947   ALKPHOS 78 10/14/2016 0850   BILITOT 1.0 01/23/2017 0947   BILITOT 0.8 10/14/2016 0850   PROT 7.3 01/23/2017 0947   PROT 7.4 10/14/2016 0850   ALBUMIN 4.2 01/23/2017 0947   ALBUMIN 4.3 10/14/2016 0850     Studies/Results: No results found.  Anti-infectives: Anti-infectives    Start     Dose/Rate Route Frequency Ordered Stop   01/28/17 2200  ceFAZolin (ANCEF) IVPB 2g/100 mL premix     2 g 200 mL/hr over 30 Minutes Intravenous Every 8 hours 01/28/17 1804 01/28/17 2247   01/28/17 1257  ceFAZolin (ANCEF) 2-4 GM/100ML-% IVPB    Comments:  Waldron Session   : cabinet override      01/28/17 1257 01/29/17 0059   01/28/17 1124  ceFAZolin (ANCEF) IVPB 2g/100 mL premix  Status:  Discontinued     2 g 200 mL/hr over 30 Minutes Intravenous On call to O.R. 01/28/17 1124 01/28/17 1756       Dana Debo J 01/29/2017

## 2017-01-29 NOTE — Discharge Instructions (Signed)
[facility]   VENTRAL (ABDOMINAL WALL) HERNIA REPAIR: POST OP INSTRUCTIONS  Always review your discharge instruction sheet given to you by the facility where your surgery was performed. IF YOU HAVE DISABILITY OR FAMILY LEAVE FORMS, YOU MUST BRING THEM TO THE OFFICE FOR PROCESSING.   DO NOT GIVE THEM TO YOUR DOCTOR.  1. A  prescription for pain medication may be given to you upon discharge.  Take your pain medication as prescribed, if needed.  If narcotic pain medicine is not needed, then you may take acetaminophen (Tylenol) or ibuprofen (Advil) as needed. 2. Take your usually prescribed medications unless otherwise directed. 3. If you need a refill on your pain medication, please contact your pharmacy.  They will contact our office to request authorization. Prescriptions will not be filled after 5 pm or on week-ends. 4. You should follow a light diet the first 24 hours after arrival home, such as soup and crackers, etc.  Be sure to include lots of fluids daily.  Resume your normal diet the day after surgery. 5. Most patients will experience some swelling and bruising around the hernia repair site.  Ice packs and reclining will help.  Swelling and bruising can take many days to resolve.  6. It is common to experience some constipation if taking pain medication after surgery.  Increasing fluid intake and taking a stool softener (such as Colace) will usually help or prevent this problem from occurring.  A mild laxative (Milk of Magnesia or Miralax) should be taken according to package directions if there are no bowel movements after 48 hours. 7. Unless discharge instructions indicate otherwise, you may remove your bandages 72 hours after surgery, and you may shower at that time.  You may have steri-strips (small skin tapes) in place directly over the incision.  These strips should be left on the skin.  If your surgeon used skin glue on the incision, you may shower in 24 hours.   The glue will flake off over the next 2-3 weeks.  Any sutures or staples will be removed at the office during your follow-up visit. 8. ACTIVITIES:  You may resume regular (light) daily activities beginning the next day--such as daily self-care, walking, climbing stairs--gradually increasing activities as tolerated.  You may have sexual intercourse when it is comfortable.  Refrain from any heavy lifting or straining-nothing over 10 pounds for 8 weeks.  a. You may drive when you are no longer taking prescription pain medication, you can comfortably wear a seatbelt, and you can safely maneuver your car and apply brakes. b. RETURN TO WORK:  Desk work/Light work in 1-2 weeks, full duty in 6 weeks._________________________________________________________ 9. You should see your doctor in the office for a follow-up appointment approximately 2-3 weeks after your surgery.  Make sure that you call for this appointment within a day or two after you arrive home to insure a convenient appointment time. 10. OTHER INSTRUCTIONS:  ___Wear abdominal binder._______________________________________________________________________________________________________________________________________________________________________________________  WHEN TO CALL YOUR DOCTOR: 1. Fever over 101.0 2. Inability to urinate 3. Nausea and/or vomiting 4. Extreme swelling or bruising 5. Continued bleeding from incision. 6. Increased pain, redness, or drainage from the incision  The clinic staff is available to answer your questions during regular business hours.  Please dont hesitate to call and ask to speak to one of the nurses for clinical concerns.  If you have a medical emergency, go to the nearest emergency room or call 911.  A surgeon from [facility] is  always on call at the hospital   [address], [place], [place], [place]  [address] ?  [address], [place], [place]   [address] [phone] ? [phone]  ? FAX [phone] Web site: [email or web address]

## 2017-01-31 ENCOUNTER — Other Ambulatory Visit: Payer: Self-pay | Admitting: Physician Assistant

## 2017-01-31 NOTE — Telephone Encounter (Signed)
Ok to refill 

## 2017-02-03 ENCOUNTER — Other Ambulatory Visit: Payer: Self-pay | Admitting: Physician Assistant

## 2017-02-03 NOTE — Telephone Encounter (Signed)
Approved # [id number] 

## 2017-02-03 NOTE — Telephone Encounter (Signed)
Rx called in 

## 2017-02-04 NOTE — Discharge Summary (Signed)
Physician Discharge Summary  Patient ID: Lynn [name] MRN: [id number] DOB/AGE: September 01, 1951 66 y.o.  Admit date: 01/28/2017 Discharge date: 01/29/2017  Admission Diagnoses:  Right lumbar incisional hernia  Discharge Diagnoses:  Active Problems:   Incisional hernia s/p laparoscopic assisted repair with mesh 01/28/17   HTN   GERD Anxiety disorder   Discharged Condition: good  Hospital Course: She underwent the above procedure and tolerated it well.  By the afternoon of POD #1 she met discharge criteria and was discharged home.  Discharge instructions were given to her.   Discharge Exam: Blood pressure 118/63, pulse 68, temperature 98.6 F (37 C), temperature source Oral, resp. rate 18, height '5\' 1"'  (1.549 m), weight 82.1 kg (181 lb), SpO2 99 %.   Disposition: 01-Home or Self Care   Allergies as of 01/29/2017      Reactions   Diclofenac Sodium    REACTION: "throat closed up"   Hydrocodone Itching, Nausea And Vomiting   Lipitor [atorvastatin Calcium] Other (See Comments)   Increased LFT's, myalgias   Niacin    REACTION: flushing   Other Other (See Comments)   Allergic to metal states body rejects it   Zolpidem Other (See Comments)   Severe headache      Medication List    TAKE these medications   acetaminophen 500 MG tablet Commonly known as:  TYLENOL Take 500-1,000 mg by mouth 3 (three) times daily as needed (for pain.).   amLODipine 10 MG tablet Commonly known as:  NORVASC TAKE 1 TABLET (10 MG TOTAL) BY MOUTH DAILY.   benazepril 20 MG tablet Commonly known as:  LOTENSIN TAKE 1 TABLET (20 MG TOTAL) BY MOUTH DAILY.   cholecalciferol 1000 units tablet Commonly known as:  VITAMIN D Take 2,000 Units by mouth 2 (two) times daily.   cyclobenzaprine 10 MG tablet Commonly known as:  FLEXERIL Take 1 tablet (10 mg total) by mouth 3 (three) times daily as needed for muscle spasms.   methocarbamol 500 MG tablet Commonly known as:  ROBAXIN Take 1 tablet (500 mg  total) by mouth every 6 (six) hours as needed for muscle spasms.   NEXIUM 40 MG capsule Generic drug:  esomeprazole TAKE ONE CAPSULE BY MOUTH EVERY MORNING   ondansetron 8 MG tablet Commonly known as:  ZOFRAN Take 1 tablet (8 mg total) by mouth every 8 (eight) hours as needed for nausea or vomiting.   oxyCODONE 5 MG immediate release tablet Commonly known as:  Oxy IR/ROXICODONE Take 1-2 tablets (5-10 mg total) by mouth every 4 (four) hours as needed for moderate pain.   PARoxetine 40 MG tablet Commonly known as:  PAXIL TAKE 1 TABLET (40 MG TOTAL) BY MOUTH EVERY MORNING. What changed:  when to take this   pravastatin 80 MG tablet Commonly known as:  PRAVACHOL TAKE 1 TABLET (80 MG TOTAL) BY MOUTH DAILY. What changed:  See the new instructions.        Signed: Odis Hollingshead 02/04/2017, 9:25 AM

## 2017-03-10 ENCOUNTER — Other Ambulatory Visit: Payer: Self-pay | Admitting: Physician Assistant

## 2017-03-10 DIAGNOSIS — F411 Generalized anxiety disorder: Secondary | ICD-10-CM

## 2017-03-10 DIAGNOSIS — F329 Major depressive disorder, single episode, unspecified: Secondary | ICD-10-CM

## 2017-03-10 DIAGNOSIS — G4700 Insomnia, unspecified: Secondary | ICD-10-CM

## 2017-03-10 DIAGNOSIS — F32A Depression, unspecified: Secondary | ICD-10-CM

## 2017-04-03 ENCOUNTER — Encounter: Payer: Self-pay | Admitting: Physician Assistant

## 2017-04-16 ENCOUNTER — Ambulatory Visit: Payer: Medicare Other | Admitting: Physician Assistant

## 2017-04-20 ENCOUNTER — Other Ambulatory Visit: Payer: Self-pay | Admitting: Physician Assistant

## 2017-04-21 DIAGNOSIS — Z803 Family history of malignant neoplasm of breast: Secondary | ICD-10-CM | POA: Diagnosis not present

## 2017-04-21 DIAGNOSIS — Z1231 Encounter for screening mammogram for malignant neoplasm of breast: Secondary | ICD-10-CM | POA: Diagnosis not present

## 2017-04-21 LAB — HM MAMMOGRAPHY

## 2017-04-21 NOTE — Telephone Encounter (Signed)
Refill appropriate 

## 2017-05-01 ENCOUNTER — Encounter: Payer: Self-pay | Admitting: Physician Assistant

## 2017-05-01 ENCOUNTER — Ambulatory Visit (INDEPENDENT_AMBULATORY_CARE_PROVIDER_SITE_OTHER): Payer: Medicare Other | Admitting: Physician Assistant

## 2017-05-01 VITALS — BP 118/86 | HR 92 | Temp 97.9°F | Resp 16 | Ht 61.0 in | Wt 190.8 lb

## 2017-05-01 DIAGNOSIS — E039 Hypothyroidism, unspecified: Secondary | ICD-10-CM | POA: Diagnosis not present

## 2017-05-01 DIAGNOSIS — H9192 Unspecified hearing loss, left ear: Secondary | ICD-10-CM | POA: Diagnosis not present

## 2017-05-01 DIAGNOSIS — F411 Generalized anxiety disorder: Secondary | ICD-10-CM

## 2017-05-01 DIAGNOSIS — H9312 Tinnitus, left ear: Secondary | ICD-10-CM

## 2017-05-01 DIAGNOSIS — I10 Essential (primary) hypertension: Secondary | ICD-10-CM | POA: Diagnosis not present

## 2017-05-01 DIAGNOSIS — R739 Hyperglycemia, unspecified: Secondary | ICD-10-CM | POA: Diagnosis not present

## 2017-05-01 DIAGNOSIS — E785 Hyperlipidemia, unspecified: Secondary | ICD-10-CM

## 2017-05-01 DIAGNOSIS — F4310 Post-traumatic stress disorder, unspecified: Secondary | ICD-10-CM | POA: Diagnosis not present

## 2017-05-01 DIAGNOSIS — E038 Other specified hypothyroidism: Secondary | ICD-10-CM
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
## 2017-05-02 LAB — LIPID PANEL
CHOL/HDL RATIO: 4.2 ratio (ref <5.0)
CHOLESTEROL: 211 mg/dL — AB (ref <200)
HDL: 50 mg/dL — ABNORMAL LOW (ref >50)
LDL Cholesterol: 116 mg/dL — ABNORMAL HIGH (ref <100)
Triglycerides: 224 mg/dL — ABNORMAL HIGH (ref <150)
VLDL: 45 mg/dL — AB (ref <30)

## 2017-05-02 LAB — COMPLETE METABOLIC PANEL WITH GFR
ALT: 13 U/L (ref 6–29)
AST: 15 U/L (ref 10–35)
Albumin: 4.3 g/dL (ref 3.6–5.1)
Alkaline Phosphatase: 91 U/L (ref 33–130)
BUN: 19 mg/dL (ref 7–25)
CALCIUM: 9.2 mg/dL (ref 8.6–10.4)
CHLORIDE: 103 mmol/L (ref 98–110)
CO2: 27 mmol/L (ref 20–32)
Creat: 0.78 mg/dL (ref 0.50–0.99)
GFR, Est African American: >89 mL/min (ref >=60)
GFR, Est Non African American: 81 mL/min (ref >=60)
Glucose, Bld: 107 mg/dL — ABNORMAL HIGH (ref 70–99)
POTASSIUM: 4.2 mmol/L (ref 3.5–5.3)
SODIUM: 142 mmol/L (ref 135–146)
Total Bilirubin: 0.8 mg/dL (ref 0.2–1.2)
Total Protein: 7.2 g/dL (ref 6.1–8.1)

## 2017-05-05 ENCOUNTER — Other Ambulatory Visit: Payer: Self-pay | Admitting: Physician Assistant

## 2017-05-05 NOTE — Telephone Encounter (Signed)
rx called into pharmacy

## 2017-05-05 NOTE — Telephone Encounter (Signed)
Approved # [id number] 

## 2017-05-05 NOTE — Telephone Encounter (Signed)
Last OV 8/16 Last refill 5/21 Ok to refill?

## 2017-05-08 NOTE — Addendum Note (Signed)
Addendum  created 05/08/17 1435 by Roberts Gaudy, MD   Sign clinical note

## 2017-05-12 ENCOUNTER — Other Ambulatory Visit: Payer: Self-pay | Admitting: Physician Assistant

## 2017-05-21 ENCOUNTER — Other Ambulatory Visit: Payer: Self-pay | Admitting: Physician Assistant

## 2017-05-22 DIAGNOSIS — R1310 Dysphagia, unspecified: Secondary | ICD-10-CM | POA: Diagnosis not present

## 2017-05-22 DIAGNOSIS — I10 Essential (primary) hypertension: Secondary | ICD-10-CM | POA: Diagnosis not present

## 2017-05-22 DIAGNOSIS — Z6836 Body mass index (BMI) 36.0-36.9, adult: Secondary | ICD-10-CM | POA: Diagnosis not present

## 2017-05-22 DIAGNOSIS — Z981 Arthrodesis status: Secondary | ICD-10-CM | POA: Diagnosis not present

## 2017-05-22 DIAGNOSIS — R490 Dysphonia: Secondary | ICD-10-CM | POA: Diagnosis not present

## 2017-05-22 DIAGNOSIS — K432 Incisional hernia without obstruction or gangrene: Secondary | ICD-10-CM | POA: Diagnosis not present

## 2017-06-11 ENCOUNTER — Encounter: Payer: Self-pay | Admitting: Family Medicine

## 2017-06-13 DIAGNOSIS — H9193 Unspecified hearing loss, bilateral: Secondary | ICD-10-CM | POA: Diagnosis not present

## 2017-06-13 DIAGNOSIS — K219 Gastro-esophageal reflux disease without esophagitis: Secondary | ICD-10-CM | POA: Diagnosis not present

## 2017-06-13 DIAGNOSIS — Z87891 Personal history of nicotine dependence: Secondary | ICD-10-CM | POA: Diagnosis not present

## 2017-06-13 DIAGNOSIS — R490 Dysphonia: Secondary | ICD-10-CM | POA: Insufficient documentation

## 2017-06-13 DIAGNOSIS — Z8739 Personal history of other diseases of the musculoskeletal system and connective tissue: Secondary | ICD-10-CM | POA: Diagnosis not present

## 2017-06-13 DIAGNOSIS — R1314 Dysphagia, pharyngoesophageal phase: Secondary | ICD-10-CM | POA: Insufficient documentation

## 2017-06-13 DIAGNOSIS — H9313 Tinnitus, bilateral: Secondary | ICD-10-CM | POA: Diagnosis not present

## 2017-06-19 ENCOUNTER — Other Ambulatory Visit: Payer: Self-pay | Admitting: *Deleted

## 2017-06-19 ENCOUNTER — Ambulatory Visit (INDEPENDENT_AMBULATORY_CARE_PROVIDER_SITE_OTHER): Payer: Medicare Other | Admitting: Physician Assistant

## 2017-06-19 ENCOUNTER — Encounter: Payer: Self-pay | Admitting: Physician Assistant

## 2017-06-19 VITALS — BP 122/62 | HR 88 | Temp 98.9°F | Resp 16 | Ht 61.0 in | Wt 195.0 lb

## 2017-06-19 DIAGNOSIS — M79605 Pain in left leg: Secondary | ICD-10-CM | POA: Diagnosis not present

## 2017-06-19 MED ORDER — ESOMEPRAZOLE MAGNESIUM 40 MG PO CPDR: DELAYED_RELEASE_CAPSULE | ORAL | 1 refills | Status: DC

## 2017-06-19 MED ORDER — CYCLOBENZAPRINE HCL 10 MG PO TABS: 10.0000 mg | ORAL_TABLET | Freq: Three times a day (TID) | ORAL | 0 refills | Status: DC | PRN

## 2017-06-19 MED ORDER — OXYCODONE HCL 5 MG PO TABS: 5.0000 mg | ORAL_TABLET | ORAL | 0 refills | Status: DC | PRN
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
## 2017-06-25 ENCOUNTER — Other Ambulatory Visit: Payer: Self-pay | Admitting: *Deleted

## 2017-06-25 DIAGNOSIS — F411 Generalized anxiety disorder: Secondary | ICD-10-CM

## 2017-06-25 MED ORDER — PAROXETINE HCL 40 MG PO TABS: 40.0000 mg | ORAL_TABLET | ORAL | 1 refills | Status: DC

## 2017-07-02 DIAGNOSIS — M545 Low back pain: Secondary | ICD-10-CM | POA: Diagnosis not present

## 2017-07-02 DIAGNOSIS — M1712 Unilateral primary osteoarthritis, left knee: Secondary | ICD-10-CM | POA: Diagnosis not present

## 2017-07-02 DIAGNOSIS — M7062 Trochanteric bursitis, left hip: Secondary | ICD-10-CM | POA: Diagnosis not present

## 2017-07-16 DIAGNOSIS — M1712 Unilateral primary osteoarthritis, left knee: Secondary | ICD-10-CM | POA: Diagnosis not present

## 2017-07-16 DIAGNOSIS — M7062 Trochanteric bursitis, left hip: Secondary | ICD-10-CM | POA: Diagnosis not present

## 2017-07-22 DIAGNOSIS — H2513 Age-related nuclear cataract, bilateral: Secondary | ICD-10-CM | POA: Diagnosis not present

## 2017-07-22 DIAGNOSIS — H40033 Anatomical narrow angle, bilateral: Secondary | ICD-10-CM | POA: Diagnosis not present

## 2017-07-22 DIAGNOSIS — H40053 Ocular hypertension, bilateral: Secondary | ICD-10-CM | POA: Diagnosis not present

## 2017-07-30 ENCOUNTER — Other Ambulatory Visit: Payer: Self-pay | Admitting: Physician Assistant

## 2017-07-30 NOTE — Telephone Encounter (Signed)
Approved # [id number] 

## 2017-07-30 NOTE — Telephone Encounter (Signed)
Last OV 10/04/ Last refill 8/20 Ok to refill?

## 2017-07-31 NOTE — Telephone Encounter (Signed)
Rx called in to pharmacy. 

## 2017-08-05 ENCOUNTER — Other Ambulatory Visit: Payer: Self-pay

## 2017-08-05 MED ORDER — PRAVASTATIN SODIUM 80 MG PO TABS: ORAL_TABLET | ORAL | 0 refills | Status: DC

## 2017-08-09 ENCOUNTER — Other Ambulatory Visit: Payer: Self-pay

## 2017-08-09 ENCOUNTER — Emergency Department (HOSPITAL_COMMUNITY)
Admission: EM | Admit: 2017-08-09 | Discharge: 2017-08-09 | Disposition: A | Discharge status: Home / Self Care | Payer: Medicare Other | Attending: Emergency Medicine | Admitting: Emergency Medicine

## 2017-08-09 ENCOUNTER — Encounter (HOSPITAL_COMMUNITY): Payer: Self-pay | Admitting: Emergency Medicine

## 2017-08-09 ENCOUNTER — Emergency Department (HOSPITAL_COMMUNITY): Payer: Medicare Other

## 2017-08-09 DIAGNOSIS — Z79899 Other long term (current) drug therapy: Secondary | ICD-10-CM | POA: Insufficient documentation

## 2017-08-09 DIAGNOSIS — S79912A Unspecified injury of left hip, initial encounter: Secondary | ICD-10-CM | POA: Diagnosis not present

## 2017-08-09 DIAGNOSIS — E039 Hypothyroidism, unspecified: Secondary | ICD-10-CM | POA: Insufficient documentation

## 2017-08-09 DIAGNOSIS — I10 Essential (primary) hypertension: Secondary | ICD-10-CM | POA: Diagnosis not present

## 2017-08-09 DIAGNOSIS — S299XXA Unspecified injury of thorax, initial encounter: Secondary | ICD-10-CM | POA: Diagnosis not present

## 2017-08-09 DIAGNOSIS — R404 Transient alteration of awareness: Secondary | ICD-10-CM | POA: Diagnosis not present

## 2017-08-09 DIAGNOSIS — R531 Weakness: Secondary | ICD-10-CM | POA: Diagnosis not present

## 2017-08-09 DIAGNOSIS — S8992XA Unspecified injury of left lower leg, initial encounter: Secondary | ICD-10-CM | POA: Diagnosis not present

## 2017-08-09 DIAGNOSIS — S99812A Other specified injuries of left ankle, initial encounter: Secondary | ICD-10-CM | POA: Diagnosis not present

## 2017-08-09 DIAGNOSIS — R0782 Intercostal pain: Secondary | ICD-10-CM | POA: Diagnosis not present

## 2017-08-09 DIAGNOSIS — M79605 Pain in left leg: Secondary | ICD-10-CM | POA: Diagnosis not present

## 2017-08-09 DIAGNOSIS — W19XXXA Unspecified fall, initial encounter: Secondary | ICD-10-CM

## 2017-08-09 DIAGNOSIS — M25562 Pain in left knee: Secondary | ICD-10-CM | POA: Diagnosis not present

## 2017-08-09 DIAGNOSIS — Z87891 Personal history of nicotine dependence: Secondary | ICD-10-CM | POA: Insufficient documentation

## 2017-08-09 DIAGNOSIS — M25552 Pain in left hip: Secondary | ICD-10-CM | POA: Diagnosis not present

## 2017-08-09 MED ORDER — ACETAMINOPHEN 325 MG PO TABS
650.0000 mg | ORAL_TABLET | Freq: Once | ORAL | Status: AC
  Administered 2017-08-09: 650 mg via ORAL
  Filled 2017-08-09: qty 2

## 2017-08-09 NOTE — Discharge Instructions (Signed)
As discussed, your evaluation today has been largely reassuring.  But, it is important that you monitor your condition carefully, and do not hesitate to return to the ED if you develop new, or concerning changes in your condition. ? ?Otherwise, please follow-up with your physician for appropriate ongoing care. ? ?
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
## 2017-08-09 NOTE — ED Triage Notes (Signed)
Pt fell 2 days ago, mostly landed on left side. C/o of left hip, bilateral ribs. Pt reports it is painful to take deep breaths.

## 2017-08-13 ENCOUNTER — Other Ambulatory Visit: Payer: Self-pay

## 2017-08-13 DIAGNOSIS — R490 Dysphonia: Secondary | ICD-10-CM | POA: Diagnosis not present

## 2017-08-13 DIAGNOSIS — R1314 Dysphagia, pharyngoesophageal phase: Secondary | ICD-10-CM | POA: Diagnosis not present

## 2017-08-13 DIAGNOSIS — H903 Sensorineural hearing loss, bilateral: Secondary | ICD-10-CM | POA: Diagnosis not present

## 2017-08-13 DIAGNOSIS — H9193 Unspecified hearing loss, bilateral: Secondary | ICD-10-CM | POA: Diagnosis not present

## 2017-08-13 DIAGNOSIS — H9313 Tinnitus, bilateral: Secondary | ICD-10-CM | POA: Diagnosis not present

## 2017-08-13 MED ORDER — PRAVASTATIN SODIUM 80 MG PO TABS: ORAL_TABLET | ORAL | 0 refills | Status: DC

## 2017-08-14 DIAGNOSIS — H40033 Anatomical narrow angle, bilateral: Secondary | ICD-10-CM | POA: Diagnosis not present

## 2017-08-20 ENCOUNTER — Other Ambulatory Visit: Payer: Self-pay

## 2017-08-20 MED ORDER — AMLODIPINE BESYLATE 10 MG PO TABS: ORAL_TABLET | ORAL | 1 refills | Status: DC

## 2017-08-20 MED ORDER — BENAZEPRIL HCL 20 MG PO TABS: ORAL_TABLET | ORAL | 1 refills | Status: DC

## 2017-08-20 NOTE — Telephone Encounter (Signed)
Refill appropriate 

## 2017-08-21 DIAGNOSIS — M1712 Unilateral primary osteoarthritis, left knee: Secondary | ICD-10-CM | POA: Diagnosis not present

## 2017-08-25 ENCOUNTER — Ambulatory Visit: Payer: Medicare Other | Admitting: Audiology

## 2017-09-18 DIAGNOSIS — H40053 Ocular hypertension, bilateral: Secondary | ICD-10-CM | POA: Diagnosis not present

## 2017-09-18 DIAGNOSIS — H40033 Anatomical narrow angle, bilateral: Secondary | ICD-10-CM | POA: Diagnosis not present

## 2017-09-18 DIAGNOSIS — H2513 Age-related nuclear cataract, bilateral: Secondary | ICD-10-CM | POA: Diagnosis not present

## 2017-10-27 ENCOUNTER — Ambulatory Visit (INDEPENDENT_AMBULATORY_CARE_PROVIDER_SITE_OTHER): Payer: Medicare Other | Admitting: Physician Assistant

## 2017-10-27 ENCOUNTER — Other Ambulatory Visit: Payer: Self-pay

## 2017-10-27 ENCOUNTER — Encounter: Payer: Self-pay | Admitting: Physician Assistant

## 2017-10-27 VITALS — BP 118/80 | HR 81 | Temp 98.1°F | Resp 14 | Ht 61.0 in | Wt 190.6 lb

## 2017-10-27 DIAGNOSIS — Z1159 Encounter for screening for other viral diseases: Secondary | ICD-10-CM

## 2017-10-27 DIAGNOSIS — R739 Hyperglycemia, unspecified: Secondary | ICD-10-CM | POA: Diagnosis not present

## 2017-10-27 DIAGNOSIS — K219 Gastro-esophageal reflux disease without esophagitis: Secondary | ICD-10-CM | POA: Diagnosis not present

## 2017-10-27 DIAGNOSIS — E785 Hyperlipidemia, unspecified: Secondary | ICD-10-CM | POA: Diagnosis not present

## 2017-10-27 DIAGNOSIS — G4700 Insomnia, unspecified: Secondary | ICD-10-CM

## 2017-10-27 DIAGNOSIS — E559 Vitamin D deficiency, unspecified: Secondary | ICD-10-CM | POA: Diagnosis not present

## 2017-10-27 DIAGNOSIS — Z114 Encounter for screening for human immunodeficiency virus [HIV]: Secondary | ICD-10-CM

## 2017-10-27 DIAGNOSIS — I10 Essential (primary) hypertension: Secondary | ICD-10-CM | POA: Diagnosis not present

## 2017-10-27 DIAGNOSIS — Z8249 Family history of ischemic heart disease and other diseases of the circulatory system: Secondary | ICD-10-CM | POA: Diagnosis not present

## 2017-10-27 DIAGNOSIS — Z23 Encounter for immunization: Secondary | ICD-10-CM | POA: Diagnosis not present

## 2017-10-27 DIAGNOSIS — F411 Generalized anxiety disorder: Secondary | ICD-10-CM

## 2017-10-27 DIAGNOSIS — F410 Panic disorder [episodic paroxysmal anxiety] without agoraphobia: Secondary | ICD-10-CM | POA: Diagnosis not present

## 2017-10-27 NOTE — Addendum Note (Signed)
Addended by: Vonna Kotyk A on: 10/27/2017 05:05 PM   Modules accepted: Orders
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
## 2017-10-28 LAB — COMPLETE METABOLIC PANEL WITH GFR
AG Ratio: 1.4 (calc) (ref 1.0–2.5)
ALBUMIN MSPROF: 4.2 g/dL (ref 3.6–5.1)
ALKALINE PHOSPHATASE (APISO): 82 U/L (ref 33–130)
ALT: 7 U/L (ref 6–29)
AST: 14 U/L (ref 10–35)
BILIRUBIN TOTAL: 0.6 mg/dL (ref 0.2–1.2)
BUN: 15 mg/dL (ref 7–25)
CHLORIDE: 105 mmol/L (ref 98–110)
CO2: 30 mmol/L (ref 20–32)
Calcium: 9.4 mg/dL (ref 8.6–10.4)
Creat: 0.7 mg/dL (ref 0.50–0.99)
GFR, Est African American: 106 mL/min/{1.73_m2} (ref >=60)
GFR, Est Non African American: 92 mL/min/{1.73_m2} (ref >=60)
GLUCOSE: 93 mg/dL (ref 65–99)
Globulin: 3 g/dL (calc) (ref 1.9–3.7)
Potassium: 4.6 mmol/L (ref 3.5–5.3)
Sodium: 142 mmol/L (ref 135–146)
Total Protein: 7.2 g/dL (ref 6.1–8.1)

## 2017-10-28 LAB — LIPID PANEL
CHOL/HDL RATIO: 3.5 (calc) (ref <5.0)
CHOLESTEROL: 172 mg/dL (ref <200)
HDL: 49 mg/dL — AB (ref >50)
LDL CHOLESTEROL (CALC): 100 mg/dL — AB
Non-HDL Cholesterol (Calc): 123 mg/dL (calc) (ref <130)
TRIGLYCERIDES: 129 mg/dL (ref <150)

## 2017-10-28 LAB — HEMOGLOBIN A1C
Hgb A1c MFr Bld: 5.7 % of total Hgb — ABNORMAL HIGH (ref <5.7)
Mean Plasma Glucose: 117 (calc)
eAG (mmol/L): 6.5 (calc)

## 2017-10-28 LAB — HEPATITIS C ANTIBODY
Hepatitis C Ab: NONREACTIVE
SIGNAL TO CUT-OFF: 0.03 (ref <1.00)

## 2017-10-28 LAB — HIV ANTIBODY (ROUTINE TESTING W REFLEX): HIV: NONREACTIVE

## 2017-10-30 ENCOUNTER — Other Ambulatory Visit: Payer: Self-pay | Admitting: Physician Assistant

## 2017-10-30 NOTE — Telephone Encounter (Signed)
Approved. Sent.

## 2017-10-30 NOTE — Telephone Encounter (Signed)
Last OV 10/27/2017 LAST REFILL 07/31/2017 Okay to refill?

## 2017-12-16 DIAGNOSIS — M1712 Unilateral primary osteoarthritis, left knee: Secondary | ICD-10-CM | POA: Diagnosis not present

## 2017-12-16 DIAGNOSIS — M5432 Sciatica, left side: Secondary | ICD-10-CM | POA: Diagnosis not present

## 2017-12-18 DIAGNOSIS — M1712 Unilateral primary osteoarthritis, left knee: Secondary | ICD-10-CM | POA: Diagnosis not present

## 2018-01-08 DIAGNOSIS — M1712 Unilateral primary osteoarthritis, left knee: Secondary | ICD-10-CM | POA: Diagnosis not present

## 2018-01-10 ENCOUNTER — Other Ambulatory Visit: Payer: Self-pay | Admitting: Physician Assistant

## 2018-01-30 ENCOUNTER — Encounter: Payer: Self-pay | Admitting: Family Medicine

## 2018-01-30 ENCOUNTER — Ambulatory Visit (INDEPENDENT_AMBULATORY_CARE_PROVIDER_SITE_OTHER): Payer: Medicare Other | Admitting: Family Medicine

## 2018-01-30 ENCOUNTER — Other Ambulatory Visit: Payer: Self-pay

## 2018-01-30 VITALS — Temp 98.1°F | Resp 14 | Ht 61.0 in | Wt 184.4 lb

## 2018-01-30 DIAGNOSIS — R42 Dizziness and giddiness: Secondary | ICD-10-CM

## 2018-01-30 DIAGNOSIS — J302 Other seasonal allergic rhinitis: Secondary | ICD-10-CM

## 2018-01-30 DIAGNOSIS — H60502 Unspecified acute noninfective otitis externa, left ear: Secondary | ICD-10-CM

## 2018-01-30 MED ORDER — CETIRIZINE HCL 10 MG PO TABS: 10.0000 mg | ORAL_TABLET | Freq: Every day | ORAL | 11 refills | Status: DC

## 2018-01-30 MED ORDER — MECLIZINE HCL 12.5 MG PO TABS: 12.5000 mg | ORAL_TABLET | Freq: Three times a day (TID) | ORAL | 0 refills | Status: DC | PRN

## 2018-01-30 MED ORDER — CIPROFLOXACIN HCL 0.3 % OP SOLN: 4.0000 [drp] | Freq: Two times a day (BID) | OPHTHALMIC | 0 refills | Status: AC

## 2018-01-30 MED ORDER — MONTELUKAST SODIUM 10 MG PO TABS: 10.0000 mg | ORAL_TABLET | Freq: Every day | ORAL | 3 refills | Status: DC

## 2018-01-30 MED ORDER — MOMETASONE FUROATE 50 MCG/ACT NA SUSP: 2.0000 | Freq: Every day | NASAL | 12 refills | Status: DC

## 2018-01-30 NOTE — Patient Instructions (Addendum)
I cannot see your ear drum clearly with all the swelling so I need to start you on drops that are safe if the ear drum is ruptured, and I need you to come recheck your ear early next week.     Otitis Externa Otitis externa is an infection of the outer ear canal. The outer ear canal is the area between the outside of the ear and the eardrum. Otitis externa is sometimes called "swimmer's ear." What are the causes? This condition may be caused by:  Swimming in dirty water.  Moisture in the ear.  An injury to the inside of the ear.  An object stuck in the ear.  A cut or scrape on the outside of the ear.  What increases the risk? This condition is more likely to develop in swimmers. What are the signs or symptoms? The first symptom of this condition is often itching in the ear. Later signs and symptoms include:  Swelling of the ear.  Redness in the ear.  Ear pain. The pain may get worse when you pull on your ear.  Pus coming from the ear.  How is this diagnosed? This condition may be diagnosed by examining the ear and testing fluid from the ear for bacteria and funguses. How is this treated? This condition may be treated with:  Antibiotic ear drops. These are often given for 10-14 days.  Medicine to reduce itching and swelling.  Follow these instructions at home:  If you were prescribed antibiotic ear drops, apply them as told by your health care provider. Do not stop using the antibiotic even if your condition improves.  Take over-the-counter and prescription medicines only as told by your health care provider.  Keep all follow-up visits as told by your health care provider. This is important. How is this prevented?  Keep your ear dry. Use the corner of a towel to dry your ear after you swim or bathe.  Avoid scratching or putting things in your ear. Doing these things can damage the ear canal or remove the protective wax that lines it, which makes it easier for bacteria  and funguses to grow.  Avoid swimming in lakes, polluted water, or pools that may not have the right amount of chlorine.  Consider making ear drops and putting 3 or 4 drops in each ear after you swim. Ask your health care provider about how you can make ear drops. Contact a health care provider if:  You have a fever.  After 3 days your ear is still red, swollen, painful, or draining pus.  Your redness, swelling, or pain gets worse.  You have a severe headache.  You have redness, swelling, pain, or tenderness in the area behind your ear. This information is not intended to replace advice given to you by your health care provider. Make sure you discuss any questions you have with your health care provider. Document Released: 09/02/2005 Document Revised: 10/10/2015 Document Reviewed: 06/12/2015 Elsevier Interactive Patient Education  2018 [place] The eardrum is a thin, round tissue inside the ear. It allows you to hear. The eardrum can get torn (perforated). Eardrums often heal on their own. There is often little or no long-term hearing loss. Follow these instructions at home:  Keep your ear dry while it heals. Do not let your head go under water. Do not swim or dive until your doctor says it is okay.  Before you take a bath or shower, do one of these things  to keep water out of your ear: ? Put a waterproof earplug in your ear. ? Put petroleum jelly all over a cotton ball. Put the cotton ball in your ear.  Take medicines only as told by your doctor.  Avoid blowing your nose if you can. If you blow your nose, do it gently.  Continue your normal activities after your eardrum heals. Your doctor will tell you when your eardrum has healed.  Talk to your doctor before you fly on an airplane.  Keep all doctor follow-up visits as told by your doctor. This is important. Contact a doctor if:  You have a fever. Get help right away if:  You have blood or  yellowish-white fluid (pus) coming from your ear.  You feel dizzy or off balance.  You feel sick to your stomach (nauseous), or you throw up (vomit).  You have more pain. This information is not intended to replace advice given to you by your health care provider. Make sure you discuss any questions you have with your health care provider. Document Released: 02/20/2010 Document Revised: 02/08/2016 Document Reviewed: 04/11/2014 Elsevier Interactive Patient Education  2018 [employer].  Allergic Rhinitis, Adult Allergic rhinitis is an allergic reaction that affects the mucous membrane inside the nose. It causes sneezing, a runny or stuffy nose, and the feeling of mucus going down the back of the throat (postnasal drip). Allergic rhinitis can be mild to severe. There are two types of allergic rhinitis:  Seasonal. This type is also called hay fever. It happens only during certain seasons.  Perennial. This type can happen at any time of the year.  What are the causes? This condition happens when the body's defense system (immune system) responds to certain harmless substances called allergens as though they were germs.  Seasonal allergic rhinitis is triggered by pollen, which can come from grasses, trees, and weeds. Perennial allergic rhinitis may be caused by:  House dust mites.  Pet dander.  Mold spores.  What are the signs or symptoms? Symptoms of this condition include:  Sneezing.  Runny or stuffy nose (nasal congestion).  Postnasal drip.  Itchy nose.  Tearing of the eyes.  Trouble sleeping.  Daytime sleepiness.  How is this diagnosed? This condition may be diagnosed based on:  Your medical history.  A physical exam.  Tests to check for related conditions, such as: ? Asthma. ? Pink eye. ? Ear infection. ? Upper respiratory infection.  Tests to find out which allergens trigger your symptoms. These may include skin or blood tests.  How is this  treated? There is no cure for this condition, but treatment can help control symptoms. Treatment may include:  Taking medicines that block allergy symptoms, such as antihistamines. Medicine may be given as a shot, nasal spray, or pill.  Avoiding the allergen.  Desensitization. This treatment involves getting ongoing shots until your body becomes less sensitive to the allergen. This treatment may be done if other treatments do not help.  If taking medicine and avoiding the allergen does not work, new, stronger medicines may be prescribed.  Follow these instructions at home:  Find out what you are allergic to. Common allergens include smoke, dust, and pollen.  Avoid the things you are allergic to. These are some things you can do to help avoid allergens: ? Replace carpet with wood, tile, or vinyl flooring. Carpet can trap dander and dust. ? Do not smoke. Do not allow smoking in your home. ? Change your heating and air conditioning  filter at least once a month. ? During allergy season:  Keep windows closed as much as possible.  Plan outdoor activities when pollen counts are lowest. This is usually during the evening hours.  When coming indoors, change clothing and shower before sitting on furniture or bedding.  Take over-the-counter and prescription medicines only as told by your health care provider.  Keep all follow-up visits as told by your health care provider. This is important. Contact a health care provider if:  You have a fever.  You develop a persistent cough.  You make whistling sounds when you breathe (you wheeze).  Your symptoms interfere with your normal daily activities. Get help right away if:  You have shortness of breath. Summary  This condition can be managed by taking medicines as directed and avoiding allergens.  Contact your health care provider if you develop a persistent cough or fever.  During allergy season, keep windows closed as much as  possible. This information is not intended to replace advice given to you by your health care provider. Make sure you discuss any questions you have with your health care provider. Document Released: 05/28/2001 Document Revised: 10/10/2016 Document Reviewed: 10/10/2016 Elsevier Interactive Patient Education  [employer].

## 2018-01-30 NOTE — Progress Notes (Signed)
Patient ID: Lynn [name], female    DOB: [birth year], 67 y.o.   MRN: [id number]  PCP: Lynn Sheldon, PA-C  Chief Complaint  Patient presents with  . off balance/ear drainage    x 1 week    Subjective:   Lynn [name] is a 67 y.o. female, presents to clinic about 3 days of feeling "off balance" with fairly constant cold chills, also has head, ears, eyes, and sinus pressure with congestion, drainage, itching and sneezing.  3-4 days ago the itching her ears was very severe and irritating, had left ear popping and clicking, so she cleaned it with a Q-tip and she states that she "put it deep in there" where it made her nervous that she damaged her scratched her ear.  At that time she denied any severe pain, hearing change, drainage, bleeding vertigo .  However , when she woke up the next morning she had dark drainage soaked into her pillow with new gradually worsening left ear pain that radiated behind ear and slightly down neck, with new associated lightheadedness and difficulty with her balance.  Balance has been more off and worsened when changing positions, going from laying or sitting to standing or with sudden head movements.  She states that she does not feel like she is going to pass out, she does not feel like the room is spinning, but she does feel like she is lightheaded and off-balance, with associated nausea when off-balance.  She denies headache, neck pain, fever, syncope, numbness, tingling, visual disturbances, slurred speech, confusion, difficulty concentrating.  She has abnormal gait at her baseline due to an old injury and need for knee replacement and she states that her gait has not changed from her baseline. She continues to have intermittent nasal drainage sinus pressure, has postnasal drip, her eyes are very itchy and swollen.  She denies sore throat or cough.  Has not tried any medications for allergies or sinus symptoms.  Denies chest pain, shortness of breath,  palpitations, orthopnea, vomiting     Patient Active Problem List   Diagnosis Date Noted  . Incisional hernia 01/28/2017  . Subclinical hypothyroidism 10/09/2015  . Depression 10/09/2015  . Generalized anxiety disorder 10/09/2015  . Insomnia 01/18/2015  . Hyperglycemia 03/31/2013  . Family history of premature coronary artery disease 03/31/2013  . Obesity   . Hypertension 12/14/2010  . GERD (gastroesophageal reflux disease) 12/14/2010  . HLD (hyperlipidemia) 12/14/2010  . PTSD (post-traumatic stress disorder) 12/14/2010  . Glaucoma 12/14/2010  . Panic attacks 12/14/2010  . Vitamin D deficiency 12/14/2010  . HEMORRHOIDS 03/22/2010  . CONSTIPATION 03/22/2010  . HEMATOCHEZIA 03/22/2010     Prior to Admission medications   Medication Sig Start Date End Date Taking? Authorizing Provider  acetaminophen (TYLENOL) 500 MG tablet Take 500-1,000 mg by mouth 3 (three) times daily as needed (for pain.).   Yes [provider]  ALPRAZolam Duanne Moron) 1 MG tablet TAKE ONE TABLET TWICE DAILY 10/30/17  Yes Dena Billet B, PA-C  amLODipine (NORVASC) 10 MG tablet TAKE 1 TABLET (10 MG TOTAL) BY MOUTH DAILY. 08/20/17  Yes Dena Billet B, PA-C  benazepril (LOTENSIN) 20 MG tablet TAKE 1 TABLET (20 MG TOTAL) BY MOUTH DAILY. 08/20/17  Yes Lynn Sheldon, PA-C  cholecalciferol (VITAMIN D) 1000 units tablet Take 2,000 Units by mouth 2 (two) times daily.   Yes [provider]  esomeprazole (NEXIUM) 40 MG capsule TAKE 1 CAPSULE BY MOUTH EVERY DAY IN THE MORNING 01/12/18  Yes  Dena Billet B, PA-C  PARoxetine (PAXIL) 40 MG tablet Take 1 tablet (40 mg total) by mouth every morning. 06/25/17  Yes Dena Billet B, PA-C  pravastatin (PRAVACHOL) 80 MG tablet TAKE 1 TABLET (80 MG TOTAL) BY MOUTH DAILY. 08/13/17  Yes Lynn Sheldon, PA-C     Allergies  Allergen Reactions  . Diclofenac Sodium     REACTION: "throat closed up"  . Hydrocodone Itching and Nausea And Vomiting  . Lipitor [Atorvastatin Calcium]  Other (See Comments)    Increased LFT's, myalgias  . Niacin     REACTION: flushing  . Other Other (See Comments)    Allergic to metal states body rejects it  . Zolpidem Other (See Comments)    Severe headache     Family History  Problem Relation Age of Onset  . Hypertension Mother   . Diabetes Mother   . Coronary artery disease Mother   . Diabetes Sister   . Heart disease Sister 74       CABG  . Hypertension Brother   . Anesthesia problems Neg Hx   . Hypotension Neg Hx   . Malignant hyperthermia Neg Hx   . Pseudochol deficiency Neg Hx      Social History   Socioeconomic History  . Marital status: Divorced    Spouse name: Not on file  . Number of children: Not on file  . Years of education: Not on file  . Highest education level: Not on file  Occupational History  . Not on file  Social Needs  . Financial resource strain: Not on file  . Food insecurity:    Worry: Not on file    Inability: Not on file  . Transportation needs:    Medical: Not on file    Non-medical: Not on file  Tobacco Use  . Smoking status: Former Smoker    Packs/day: 1.00    Years: 20.00    Pack years: 20.00    Types: Cigarettes    Last attempt to quit: 09/16/2009    Years since quitting: 8.3  . Smokeless tobacco: Never Used  Substance and Sexual Activity  . Alcohol use: No  . Drug use: No  . Sexual activity: Not on file  Lifestyle  . Physical activity:    Days per week: Not on file    Minutes per session: Not on file  . Stress: Not on file  Relationships  . Social connections:    Talks on phone: Not on file    Gets together: Not on file    Attends religious service: Not on file    Active member of club or organization: Not on file    Attends meetings of clubs or organizations: Not on file    Relationship status: Not on file  . Intimate partner violence:    Fear of current or ex partner: Not on file    Emotionally abused: Not on file    Physically abused: Not on file    Forced  sexual activity: Not on file  Other Topics Concern  . Not on file  Social History Narrative  . Not on file     Review of Systems  Constitutional: Positive for chills. Negative for activity change, appetite change, diaphoresis, fatigue, fever and unexpected weight change.  HENT: Positive for congestion, ear discharge, ear pain, hearing loss, postnasal drip, rhinorrhea, sinus pressure and sneezing. Negative for dental problem, drooling, facial swelling, mouth sores, nosebleeds, sinus pain, sore throat, tinnitus, trouble swallowing and voice change.  Eyes: Positive for itching. Negative for photophobia, pain, discharge, redness and visual disturbance.  Respiratory: Negative.  Negative for apnea, cough, choking, chest tightness, shortness of breath, wheezing and stridor.   Cardiovascular: Negative.  Negative for chest pain, palpitations and leg swelling.  Gastrointestinal: Positive for nausea. Negative for abdominal pain, constipation, diarrhea and vomiting.  Endocrine: Positive for cold intolerance.  Genitourinary: Negative.   Musculoskeletal: Negative.  Negative for arthralgias, myalgias, neck pain and neck stiffness.  Skin: Negative.  Negative for color change, pallor and rash.  Allergic/Immunologic: Negative.   Neurological: Positive for dizziness, light-headedness and headaches. Negative for tremors, seizures, syncope, facial asymmetry, speech difficulty, weakness and numbness.  Hematological: Negative.   Psychiatric/Behavioral: Negative.   All other systems reviewed and are negative.      Objective:    Vitals:   01/30/18 0807  Resp: 14  Temp: 98.1 F (36.7 C)  TempSrc: Oral  SpO2: 96%  Weight: 184 lb 6.4 oz (83.6 kg)  Height: 5\' 1"  (1.549 m)      Physical Exam  Constitutional: She is oriented to person, place, and time. She appears well-developed and well-nourished.  Non-toxic appearance. No distress.  HENT:  Head: Normocephalic and atraumatic. Head is without  raccoon's eyes, without Battle's sign, without right periorbital erythema and without left periorbital erythema.  Right Ear: Hearing, tympanic membrane, external ear and ear canal normal.  Left Ear: There is drainage. Decreased hearing is noted.  Mouth/Throat: Uvula is midline, oropharynx is clear and moist and mucous membranes are normal.  Bilateral nasal turbinates enlarged pale pink, no noted discharge, nares patent No sinus tenderness to palpation Right external ear normal appearing, external auditory canal normal, TM normal Left external ear normal in appearance, no tenderness to palpation Left external auditory canal with edema, erythema and drainage, no blood or purulent exudate.  Unclear if TM is ruptured, or if white cerumen is occluding with small round central opening.  Normal landmarks of TM not visualized No pre-or postauricular lymphadenopathy, no edema or erythema surrounding ear, no mastoid tenderness, edema or erythema  Eyes: Pupils are equal, round, and reactive to light. Conjunctivae, EOM and lids are normal. Lids are everted and swept, no foreign bodies found. Right eye exhibits no chemosis, no discharge and no exudate. Left eye exhibits no chemosis, no discharge and no exudate. Right conjunctiva is not injected. Right conjunctiva has no hemorrhage. Left conjunctiva is not injected. Left conjunctiva has no hemorrhage. No scleral icterus. Right eye exhibits normal extraocular motion and no nystagmus. Left eye exhibits normal extraocular motion and no nystagmus.  Palpable conjunctive erythematous bilaterally Mild swelling upper and lower eyelids b/l, inferior right eyelid with mild erythema from appearing excoriations  Neck: Trachea normal, normal range of motion, full passive range of motion without pain and phonation normal. Neck supple. No tracheal deviation, no edema and no erythema present.  Cardiovascular: Normal rate, regular rhythm, normal heart sounds and normal pulses. Exam  reveals no gallop and no friction rub.  No murmur heard. Pulses:      Radial pulses are 2+ on the right side, and 2+ on the left side.       Posterior tibial pulses are 2+ on the right side, and 2+ on the left side.  No lower extremity edema  Pulmonary/Chest: Effort normal and breath sounds normal. No stridor. No tachypnea and no bradypnea. No respiratory distress. She has no decreased breath sounds. She has no wheezes. She has no rhonchi. She has no rales. She exhibits no  tenderness.  Abdominal: Soft. Normal appearance and bowel sounds are normal. She exhibits no distension. There is no tenderness. There is no rebound and no guarding.  Musculoskeletal: Normal range of motion. She exhibits no edema or deformity.  Lymphadenopathy:    She has no cervical adenopathy.  Neurological: She is alert and oriented to person, place, and time. She has normal strength. No cranial nerve deficit or sensory deficit. She exhibits normal muscle tone. She displays a negative Romberg sign. Gait abnormal. Coordination normal.  Antalgic gait CN II-XII grossly intact Normal finger-nose   Skin: Skin is warm, dry and intact. Capillary refill takes less than 2 seconds. No rash noted. She is not diaphoretic. No pallor.  Psychiatric: She has a normal mood and affect. Her speech is normal and behavior is normal.  Nursing note and vitals reviewed.         Assessment & Plan:      ICD-10-CM   1. Acute otitis externa of left ear, unspecified type H60.502 ciprofloxacin (CILOXAN) 0.3 % ophthalmic solution  2. Vertigo R42 meclizine (ANTIVERT) 12.5 MG tablet  3. Seasonal allergies J30.2 montelukast (SINGULAIR) 10 MG tablet    mometasone (NASONEX) 50 MCG/ACT nasal spray    cetirizine (ZYRTEC) 10 MG tablet    3 days of difficulty with balance, had instrumented left ear canal and then had drainage and pain.  Her description of her balance issues is difficult to tease out because she describes it as lightheaded, but not  like she is going to pass out.  Orthostatics are normal.  She does get off balance when going from laying or sitting to standing, however all other cerebellar and balance testing is normal.  Nonfocal neurological evaluation.  Nothing concerning for central vertigo, no concern for cardiac component.  Left ear canal is significant for otitis externa, I am not sure if visualizing a ruptured TM or very deep area of cerumen and swelling with a central opening with drainage.  We will treat with Cipro drops to left ear.  She also has seasonal allergies.  Will treat those with trial of meclizine as needed.    Pt is steady with her ambulation, VSS, left in good condition.  She will come back for recheck early next week to better visualize left EAC and TM.   Delsa Grana, PA-C 01/30/18 8:43 AM

## 2018-02-03 ENCOUNTER — Ambulatory Visit: Payer: Medicare Other | Admitting: Family Medicine

## 2018-02-05 ENCOUNTER — Other Ambulatory Visit: Payer: Self-pay | Admitting: Orthopedic Surgery

## 2018-02-09 ENCOUNTER — Other Ambulatory Visit: Payer: Self-pay | Admitting: Physician Assistant

## 2018-02-10 NOTE — Telephone Encounter (Signed)
Last OV 10/27/2017 Last refill 10/30/2017 Ok to refill?

## 2018-02-11 ENCOUNTER — Ambulatory Visit: Payer: Medicare Other | Admitting: Physician Assistant

## 2018-03-05 ENCOUNTER — Telehealth (HOSPITAL_COMMUNITY): Payer: Self-pay

## 2018-03-05 ENCOUNTER — Encounter: Payer: Self-pay | Admitting: Physician Assistant

## 2018-03-05 NOTE — Telephone Encounter (Signed)
Appt  was made for pt and I called the MD's office and gave Renee the appt date and time. WT

## 2018-03-06 ENCOUNTER — Other Ambulatory Visit: Payer: Self-pay | Admitting: Orthopedic Surgery

## 2018-03-06 ENCOUNTER — Other Ambulatory Visit: Payer: Self-pay

## 2018-03-06 ENCOUNTER — Ambulatory Visit (HOSPITAL_COMMUNITY)
Admission: RE | Admit: 2018-03-06 | Discharge: 2018-03-06 | Disposition: A | Discharge status: Home / Self Care | Payer: Medicare Other | Source: Ambulatory Visit | Attending: Orthopedic Surgery | Admitting: Orthopedic Surgery

## 2018-03-06 ENCOUNTER — Encounter (HOSPITAL_COMMUNITY)
Admission: RE | Admit: 2018-03-06 | Discharge: 2018-03-06 | Disposition: A | Discharge status: Home / Self Care | Payer: Medicare Other | Source: Ambulatory Visit | Attending: Orthopedic Surgery | Admitting: Orthopedic Surgery

## 2018-03-06 ENCOUNTER — Encounter (HOSPITAL_COMMUNITY): Payer: Self-pay

## 2018-03-06 DIAGNOSIS — M1732 Unilateral post-traumatic osteoarthritis, left knee: Secondary | ICD-10-CM | POA: Diagnosis not present

## 2018-03-06 DIAGNOSIS — R918 Other nonspecific abnormal finding of lung field: Secondary | ICD-10-CM | POA: Diagnosis not present

## 2018-03-06 DIAGNOSIS — F4310 Post-traumatic stress disorder, unspecified: Secondary | ICD-10-CM | POA: Diagnosis not present

## 2018-03-06 DIAGNOSIS — Z01818 Encounter for other preprocedural examination: Secondary | ICD-10-CM | POA: Insufficient documentation

## 2018-03-06 DIAGNOSIS — M1712 Unilateral primary osteoarthritis, left knee: Secondary | ICD-10-CM

## 2018-03-06 DIAGNOSIS — K219 Gastro-esophageal reflux disease without esophagitis: Secondary | ICD-10-CM | POA: Diagnosis not present

## 2018-03-06 DIAGNOSIS — Z01810 Encounter for preprocedural cardiovascular examination: Secondary | ICD-10-CM

## 2018-03-06 DIAGNOSIS — Z87891 Personal history of nicotine dependence: Secondary | ICD-10-CM | POA: Diagnosis not present

## 2018-03-06 DIAGNOSIS — Z01812 Encounter for preprocedural laboratory examination: Secondary | ICD-10-CM

## 2018-03-06 DIAGNOSIS — E039 Hypothyroidism, unspecified: Secondary | ICD-10-CM | POA: Diagnosis not present

## 2018-03-06 DIAGNOSIS — G4733 Obstructive sleep apnea (adult) (pediatric): Secondary | ICD-10-CM | POA: Diagnosis not present

## 2018-03-06 DIAGNOSIS — F419 Anxiety disorder, unspecified: Secondary | ICD-10-CM | POA: Diagnosis not present

## 2018-03-06 DIAGNOSIS — I739 Peripheral vascular disease, unspecified: Secondary | ICD-10-CM | POA: Diagnosis not present

## 2018-03-06 DIAGNOSIS — D62 Acute posthemorrhagic anemia: Secondary | ICD-10-CM | POA: Diagnosis not present

## 2018-03-06 DIAGNOSIS — I10 Essential (primary) hypertension: Secondary | ICD-10-CM | POA: Diagnosis not present

## 2018-03-06 DIAGNOSIS — H409 Unspecified glaucoma: Secondary | ICD-10-CM | POA: Diagnosis not present

## 2018-03-06 DIAGNOSIS — E785 Hyperlipidemia, unspecified: Secondary | ICD-10-CM | POA: Diagnosis not present

## 2018-03-06 LAB — URINALYSIS, ROUTINE W REFLEX MICROSCOPIC
Bilirubin Urine: NEGATIVE
Glucose, UA: NEGATIVE mg/dL
Ketones, ur: NEGATIVE mg/dL
Nitrite: NEGATIVE
Protein, ur: NEGATIVE mg/dL
Specific Gravity, Urine: 1.018 (ref 1.005–1.030)
pH: 5 (ref 5.0–8.0)

## 2018-03-06 LAB — CBC WITH DIFFERENTIAL/PLATELET
Basophils Absolute: 0.1 10*3/uL (ref 0.0–0.1)
Basophils Relative: 1 %
Eosinophils Absolute: 0.2 10*3/uL (ref 0.0–0.7)
Eosinophils Relative: 2 %
HCT: 43.4 % (ref 36.0–46.0)
Hemoglobin: 14.3 g/dL (ref 12.0–15.0)
Lymphocytes Relative: 27 %
Lymphs Abs: 2.2 10*3/uL (ref 0.7–4.0)
MCH: 30.6 pg (ref 26.0–34.0)
MCHC: 32.9 g/dL (ref 30.0–36.0)
MCV: 92.9 fL (ref 78.0–100.0)
Monocytes Absolute: 0.9 10*3/uL (ref 0.1–1.0)
Monocytes Relative: 11 %
Neutro Abs: 4.9 10*3/uL (ref 1.7–7.7)
Neutrophils Relative %: 59 %
Platelets: 218 10*3/uL (ref 150–400)
RBC: 4.67 MIL/uL (ref 3.87–5.11)
RDW: 13.9 % (ref 11.5–15.5)
WBC: 8.3 10*3/uL (ref 4.0–10.5)

## 2018-03-06 LAB — BASIC METABOLIC PANEL
ANION GAP: 7 (ref 5–15)
BUN: 23 mg/dL — ABNORMAL HIGH (ref 6–20)
CALCIUM: 9.1 mg/dL (ref 8.9–10.3)
CO2: 28 mmol/L (ref 22–32)
Chloride: 109 mmol/L (ref 101–111)
Creatinine, Ser: 0.67 mg/dL (ref 0.44–1.00)
GFR calc non Af Amer: >60 mL/min (ref >60)
Glucose, Bld: 110 mg/dL — ABNORMAL HIGH (ref 65–99)
POTASSIUM: 4.5 mmol/L (ref 3.5–5.1)
Sodium: 144 mmol/L (ref 135–145)

## 2018-03-06 LAB — SURGICAL PCR SCREEN
MRSA, PCR: NEGATIVE
Staphylococcus aureus: NEGATIVE

## 2018-03-06 LAB — PROTIME-INR
INR: 0.96
Prothrombin Time: 12.7 seconds (ref 11.4–15.2)

## 2018-03-06 LAB — APTT: aPTT: 31 seconds (ref 24–36)

## 2018-03-06 NOTE — Care Plan (Signed)
Was able to reach patient by phone on 03/05/18. All of her phone numbers in the charts are incorrect. She states that she has family to assist her and all of her equipment. She plans to discharge to home with family and HHPT provided by [facility].  She will transition to [facility] at [place]. Plan is in place as follows, however; she has not been to [facility] for pre-op and they are having a hard time reaching her as well. MD/PA updated.   HHPT - [facility] for 5 HH visits  OPPT - [facility] ([address])  03/25/18 @ 945 - patient is aware of this appointment.  MD follow up:  03/24/18 @ 900   Please contact [place], [place] [phone] with questions or if this plan should change.

## 2018-03-06 NOTE — Progress Notes (Signed)
Patient states at preop appt that Dr Mayer Camel is aware of metal allergy.
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
## 2018-03-06 NOTE — Progress Notes (Signed)
Final EKG- 03/06/2018-epic  BMP and U/A done 03/06/2018 faxed via epic to Dr Mayer Camel.

## 2018-03-06 NOTE — Progress Notes (Signed)
CXR done 03/06/18 faxed via epic to Dr Mayer Camel.

## 2018-03-06 NOTE — Progress Notes (Signed)
Anesthesia ( Dr Gifford Shave) made aware of CXR result from 03/06/2018.  NO new orders given.

## 2018-03-06 NOTE — Patient Instructions (Signed)
Lynn [name]  03/06/2018   Your procedure is scheduled on: 03/09/2018   Report to [facility]  Entrance  Report to admitting at    [place] AM    Call this number if you have problems the morning of surgery [phone]   Remember: Do not eat food or drink liquids :After Midnight.     Take these medicines the morning of surgery with A SIP OF WATER: Xanax, Amlodipine ( Norvasc), Zyrtec, Nexium, Nasonex nasal spray, Paxil                                 You may not have any metal on your body including hair pins and              piercings  Do not wear jewelry, make-up, lotions, powders or perfumes, deodorant             Do not wear nail polish.  Do not shave  48 hours prior to surgery.               Do not bring valuables to the hospital. [place].  Contacts, dentures or bridgework may not be worn into surgery.  Leave suitcase in the car. After surgery it may be brought to your room.                     Please read over the following fact sheets you were given: _____________________________________________________________________             [facility] - Preparing for Surgery Before surgery, you can play an important role.  Because skin is not sterile, your skin needs to be as free of germs as possible.  You can reduce the number of germs on your skin by washing with CHG (chlorahexidine gluconate) soap before surgery.  CHG is an antiseptic cleaner which kills germs and bonds with the skin to continue killing germs even after washing. Please DO NOT use if you have an allergy to CHG or antibacterial soaps.  If your skin becomes reddened/irritated stop using the CHG and inform your nurse when you arrive at Short Stay. Do not shave (including legs and underarms) for at least 48 hours prior to the first CHG shower.  You may shave your face/neck. Please follow these instructions carefully:  1.  Shower with  CHG Soap the night before surgery and the  morning of Surgery.  2.  If you choose to wash your hair, wash your hair first as usual with your  normal  shampoo.  3.  After you shampoo, rinse your hair and body thoroughly to remove the  shampoo.                           4.  Use CHG as you would any other liquid soap.  You can apply chg directly  to the skin and wash                       Gently with a scrungie or clean washcloth.  5.  Apply the CHG Soap to your body ONLY FROM THE NECK DOWN.   Do not use on face/  open                           Wound or open sores. Avoid contact with eyes, ears mouth and genitals (private parts).                       Wash face,  Genitals (private parts) with your normal soap.             6.  Wash thoroughly, paying special attention to the area where your surgery  will be performed.  7.  Thoroughly rinse your body with warm water from the neck down.  8.  DO NOT shower/wash with your normal soap after using and rinsing off  the CHG Soap.                9.  Pat yourself dry with a clean towel.            10.  Wear clean pajamas.            11.  Place clean sheets on your bed the night of your first shower and do not  sleep with pets. Day of Surgery : Do not apply any lotions/deodorants the morning of surgery.  Please wear clean clothes to the hospital/surgery center.  FAILURE TO FOLLOW THESE INSTRUCTIONS MAY RESULT IN THE CANCELLATION OF YOUR SURGERY PATIENT SIGNATURE_________________________________  NURSE SIGNATURE__________________________________  ________________________________________________________________________  WHAT IS A BLOOD TRANSFUSION? Blood Transfusion Information  A transfusion is the replacement of blood or some of its parts. Blood is made up of multiple cells which provide different functions.  Red blood cells carry oxygen and are used for blood loss replacement.  White blood cells fight against infection.  Platelets control  bleeding.  Plasma helps clot blood.  Other blood products are available for specialized needs, such as hemophilia or other clotting disorders. BEFORE THE TRANSFUSION  Who gives blood for transfusions?   Healthy volunteers who are fully evaluated to make sure their blood is safe. This is blood bank blood. Transfusion therapy is the safest it has ever been in the practice of medicine. Before blood is taken from a donor, a complete history is taken to make sure that person has no history of diseases nor engages in risky social behavior (examples are intravenous drug use or sexual activity with multiple partners). The donor's travel history is screened to minimize risk of transmitting infections, such as malaria. The donated blood is tested for signs of infectious diseases, such as HIV and hepatitis. The blood is then tested to be sure it is compatible with you in order to minimize the chance of a transfusion reaction. If you or a relative donates blood, this is often done in anticipation of surgery and is not appropriate for emergency situations. It takes many days to process the donated blood. RISKS AND COMPLICATIONS Although transfusion therapy is very safe and saves many lives, the main dangers of transfusion include:   Getting an infectious disease.  Developing a transfusion reaction. This is an allergic reaction to something in the blood you were given. Every precaution is taken to prevent this. The decision to have a blood transfusion has been considered carefully by your caregiver before blood is given. Blood is not given unless the benefits outweigh the risks. AFTER THE TRANSFUSION  Right after receiving a blood transfusion, you will usually feel much better and more energetic. This is especially true if your red blood  cells have gotten low (anemic). The transfusion raises the level of the red blood cells which carry oxygen, and this usually causes an energy increase.  The nurse  administering the transfusion will monitor you carefully for complications. HOME CARE INSTRUCTIONS  No special instructions are needed after a transfusion. You may find your energy is better. Speak with your caregiver about any limitations on activity for underlying diseases you may have. SEEK MEDICAL CARE IF:   Your condition is not improving after your transfusion.  You develop redness or irritation at the intravenous (IV) site. SEEK IMMEDIATE MEDICAL CARE IF:  Any of the following symptoms occur over the next 12 hours:  Shaking chills.  You have a temperature by mouth above 102 F (38.9 C), not controlled by medicine.  Chest, back, or muscle pain.  People around you feel you are not acting correctly or are confused.  Shortness of breath or difficulty breathing.  Dizziness and fainting.  You get a rash or develop hives.  You have a decrease in urine output.  Your urine turns a dark color or changes to pink, red, or brown. Any of the following symptoms occur over the next 10 days:  You have a temperature by mouth above 102 F (38.9 C), not controlled by medicine.  Shortness of breath.  Weakness after normal activity.  The white part of the eye turns yellow (jaundice).  You have a decrease in the amount of urine or are urinating less often.  Your urine turns a dark color or changes to pink, red, or brown. Document Released: 08/30/2000 Document Revised: 11/25/2011 Document Reviewed: 04/18/2008 ExitCare Patient Information 2014 [place].  _______________________________________________________________________  Incentive Spirometer  An incentive spirometer is a tool that can help keep your lungs clear and active. This tool measures how well you are filling your lungs with each breath. Taking long deep breaths may help reverse or decrease the chance of developing breathing (pulmonary) problems (especially infection) following:  A long period of time when you are  unable to move or be active. BEFORE THE PROCEDURE   If the spirometer includes an indicator to show your best effort, your nurse or respiratory therapist will set it to a desired goal.  If possible, sit up straight or lean slightly forward. Try not to slouch.  Hold the incentive spirometer in an upright position. INSTRUCTIONS FOR USE  1. Sit on the edge of your bed if possible, or sit up as far as you can in bed or on a chair. 2. Hold the incentive spirometer in an upright position. 3. Breathe out normally. 4. Place the mouthpiece in your mouth and seal your lips tightly around it. 5. Breathe in slowly and as deeply as possible, raising the piston or the ball toward the top of the column. 6. Hold your breath for 3-5 seconds or for as long as possible. Allow the piston or ball to fall to the bottom of the column. 7. Remove the mouthpiece from your mouth and breathe out normally. 8. Rest for a few seconds and repeat Steps 1 through 7 at least 10 times every 1-2 hours when you are awake. Take your time and take a few normal breaths between deep breaths. 9. The spirometer may include an indicator to show your best effort. Use the indicator as a goal to work toward during each repetition. 10. After each set of 10 deep breaths, practice coughing to be sure your lungs are clear. If you have an incision (the cut made  at the time of surgery), support your incision when coughing by placing a pillow or rolled up towels firmly against it. Once you are able to get out of bed, walk around indoors and cough well. You may stop using the incentive spirometer when instructed by your caregiver.  RISKS AND COMPLICATIONS  Take your time so you do not get dizzy or light-headed.  If you are in pain, you may need to take or ask for pain medication before doing incentive spirometry. It is harder to take a deep breath if you are having pain. AFTER USE  Rest and breathe slowly and easily.  It can be helpful to  keep track of a log of your progress. Your caregiver can provide you with a simple table to help with this. If you are using the spirometer at home, follow these instructions: [place] IF:   You are having difficultly using the spirometer.  You have trouble using the spirometer as often as instructed.  Your pain medication is not giving enough relief while using the spirometer.  You develop fever of 100.5 F (38.1 C) or higher. SEEK IMMEDIATE MEDICAL CARE IF:   You cough up bloody sputum that had not been present before.  You develop fever of 102 F (38.9 C) or greater.  You develop worsening pain at or near the incision site. MAKE SURE YOU:   Understand these instructions.  Will watch your condition.  Will get help right away if you are not doing well or get worse. Document Released: 01/13/2007 Document Revised: 11/25/2011 Document Reviewed: 03/16/2007 [facility] Patient Information 2014 [place], [place].   ________________________________________________________________________

## 2018-03-07 LAB — ABO/RH: ABO/RH(D): O POS

## 2018-03-08 MED ORDER — BUPIVACAINE LIPOSOME 1.3 % IJ SUSP
20.0000 mL | INTRAMUSCULAR | Status: DC
  Filled 2018-03-08: qty 20

## 2018-03-08 MED ORDER — SODIUM CHLORIDE 0.9 % IV SOLN
1000.0000 mg | INTRAVENOUS | Status: AC
  Administered 2018-03-09: 1000 mg via INTRAVENOUS
  Filled 2018-03-08: qty 1100

## 2018-03-08 MED ORDER — TRANEXAMIC ACID 1000 MG/10ML IV SOLN
2000.0000 mg | INTRAVENOUS | Status: DC
  Filled 2018-03-08: qty 20

## 2018-03-09 ENCOUNTER — Inpatient Hospital Stay (HOSPITAL_COMMUNITY): Payer: Medicare Other | Admitting: Registered Nurse

## 2018-03-09 ENCOUNTER — Encounter (HOSPITAL_COMMUNITY): Payer: Self-pay | Admitting: *Deleted

## 2018-03-09 ENCOUNTER — Inpatient Hospital Stay (HOSPITAL_COMMUNITY)
Admission: RE | Admit: 2018-03-09 | Discharge: 2018-03-11 | DRG: 470 | Disposition: A | Discharge status: Home / Self Care | Payer: Medicare Other | Source: Ambulatory Visit | Attending: Orthopedic Surgery | Admitting: Orthopedic Surgery

## 2018-03-09 ENCOUNTER — Other Ambulatory Visit: Payer: Self-pay

## 2018-03-09 ENCOUNTER — Encounter (HOSPITAL_COMMUNITY)
Admission: RE | Disposition: A | Discharge status: Home / Self Care | Payer: Self-pay | Source: Ambulatory Visit | Attending: Orthopedic Surgery

## 2018-03-09 DIAGNOSIS — K219 Gastro-esophageal reflux disease without esophagitis: Secondary | ICD-10-CM | POA: Diagnosis present

## 2018-03-09 DIAGNOSIS — F419 Anxiety disorder, unspecified: Secondary | ICD-10-CM | POA: Diagnosis present

## 2018-03-09 DIAGNOSIS — Z86718 Personal history of other venous thrombosis and embolism: Secondary | ICD-10-CM | POA: Diagnosis not present

## 2018-03-09 DIAGNOSIS — Z79899 Other long term (current) drug therapy: Secondary | ICD-10-CM | POA: Diagnosis not present

## 2018-03-09 DIAGNOSIS — D62 Acute posthemorrhagic anemia: Secondary | ICD-10-CM | POA: Diagnosis not present

## 2018-03-09 DIAGNOSIS — M1732 Unilateral post-traumatic osteoarthritis, left knee: Secondary | ICD-10-CM | POA: Diagnosis not present

## 2018-03-09 DIAGNOSIS — F329 Major depressive disorder, single episode, unspecified: Secondary | ICD-10-CM | POA: Diagnosis present

## 2018-03-09 DIAGNOSIS — Z981 Arthrodesis status: Secondary | ICD-10-CM | POA: Diagnosis not present

## 2018-03-09 DIAGNOSIS — I739 Peripheral vascular disease, unspecified: Secondary | ICD-10-CM | POA: Diagnosis present

## 2018-03-09 DIAGNOSIS — Z888 Allergy status to other drugs, medicaments and biological substances status: Secondary | ICD-10-CM

## 2018-03-09 DIAGNOSIS — E039 Hypothyroidism, unspecified: Secondary | ICD-10-CM | POA: Diagnosis present

## 2018-03-09 DIAGNOSIS — E785 Hyperlipidemia, unspecified: Secondary | ICD-10-CM | POA: Diagnosis present

## 2018-03-09 DIAGNOSIS — Z87891 Personal history of nicotine dependence: Secondary | ICD-10-CM

## 2018-03-09 DIAGNOSIS — M1712 Unilateral primary osteoarthritis, left knee: Secondary | ICD-10-CM

## 2018-03-09 DIAGNOSIS — Z6834 Body mass index (BMI) 34.0-34.9, adult: Secondary | ICD-10-CM

## 2018-03-09 DIAGNOSIS — H409 Unspecified glaucoma: Secondary | ICD-10-CM | POA: Diagnosis present

## 2018-03-09 DIAGNOSIS — I10 Essential (primary) hypertension: Secondary | ICD-10-CM | POA: Diagnosis present

## 2018-03-09 DIAGNOSIS — E669 Obesity, unspecified: Secondary | ICD-10-CM | POA: Diagnosis present

## 2018-03-09 DIAGNOSIS — Z885 Allergy status to narcotic agent status: Secondary | ICD-10-CM

## 2018-03-09 DIAGNOSIS — F418 Other specified anxiety disorders: Secondary | ICD-10-CM | POA: Diagnosis not present

## 2018-03-09 DIAGNOSIS — G4733 Obstructive sleep apnea (adult) (pediatric): Secondary | ICD-10-CM | POA: Diagnosis present

## 2018-03-09 DIAGNOSIS — F4310 Post-traumatic stress disorder, unspecified: Secondary | ICD-10-CM | POA: Diagnosis present

## 2018-03-09 DIAGNOSIS — Z91048 Other nonmedicinal substance allergy status: Secondary | ICD-10-CM | POA: Diagnosis not present

## 2018-03-09 DIAGNOSIS — G8918 Other acute postprocedural pain: Secondary | ICD-10-CM | POA: Diagnosis not present

## 2018-03-09 HISTORY — PX: TOTAL KNEE ARTHROPLASTY: SHX125

## 2018-03-09 LAB — TYPE AND SCREEN
ABO/RH(D): O POS
Antibody Screen: NEGATIVE

## 2018-03-09 SURGERY — ARTHROPLASTY, KNEE, TOTAL
Anesthesia: General | Site: Knee | Laterality: Left

## 2018-03-09 MED ORDER — METOCLOPRAMIDE HCL 5 MG/ML IJ SOLN: 5.0000 mg | Freq: Three times a day (TID) | INTRAMUSCULAR | Status: DC | PRN

## 2018-03-09 MED ORDER — ONDANSETRON HCL 4 MG/2ML IJ SOLN
INTRAMUSCULAR | Status: DC | PRN
  Administered 2018-03-09: 4 mg via INTRAVENOUS

## 2018-03-09 MED ORDER — MIDAZOLAM HCL 2 MG/2ML IJ SOLN
INTRAMUSCULAR | Status: AC
  Administered 2018-03-09: 2 mg via INTRAVENOUS
  Filled 2018-03-09: qty 2

## 2018-03-09 MED ORDER — CHLORHEXIDINE GLUCONATE 4 % EX LIQD: 60.0000 mL | Freq: Once | CUTANEOUS | Status: DC

## 2018-03-09 MED ORDER — ONDANSETRON HCL 4 MG/2ML IJ SOLN
INTRAMUSCULAR | Status: AC
  Filled 2018-03-09: qty 2

## 2018-03-09 MED ORDER — ACETAMINOPHEN 325 MG PO TABS
325.0000 mg | ORAL_TABLET | Freq: Four times a day (QID) | ORAL | Status: DC | PRN
  Administered 2018-03-10: 650 mg via ORAL
  Filled 2018-03-09: qty 2

## 2018-03-09 MED ORDER — PANTOPRAZOLE SODIUM 40 MG PO TBEC
40.0000 mg | DELAYED_RELEASE_TABLET | Freq: Every day | ORAL | Status: DC
  Administered 2018-03-10 – 2018-03-11 (×2): 40 mg via ORAL
  Filled 2018-03-09 (×3): qty 1

## 2018-03-09 MED ORDER — CEFAZOLIN SODIUM-DEXTROSE 2-4 GM/100ML-% IV SOLN
2.0000 g | INTRAVENOUS | Status: AC
  Administered 2018-03-09: 2 g via INTRAVENOUS
  Filled 2018-03-09: qty 100

## 2018-03-09 MED ORDER — DEXAMETHASONE SODIUM PHOSPHATE 10 MG/ML IJ SOLN
INTRAMUSCULAR | Status: AC
  Filled 2018-03-09: qty 1

## 2018-03-09 MED ORDER — DEXAMETHASONE SODIUM PHOSPHATE 10 MG/ML IJ SOLN
10.0000 mg | Freq: Once | INTRAMUSCULAR | Status: AC
  Administered 2018-03-10: 10 mg via INTRAVENOUS
  Filled 2018-03-09: qty 1

## 2018-03-09 MED ORDER — SUGAMMADEX SODIUM 200 MG/2ML IV SOLN
INTRAVENOUS | Status: DC | PRN
  Administered 2018-03-09: 200 mg via INTRAVENOUS

## 2018-03-09 MED ORDER — POLYVINYL ALCOHOL 1.4 % OP SOLN: 1.0000 [drp] | OPHTHALMIC | Status: DC | PRN

## 2018-03-09 MED ORDER — BUPIVACAINE LIPOSOME 1.3 % IJ SUSP
INTRAMUSCULAR | Status: DC | PRN
  Administered 2018-03-09 (×2): 20 mL

## 2018-03-09 MED ORDER — ALPRAZOLAM 1 MG PO TABS
1.0000 mg | ORAL_TABLET | Freq: Two times a day (BID) | ORAL | Status: DC
  Administered 2018-03-09 – 2018-03-11 (×4): 1 mg via ORAL
  Filled 2018-03-09 (×4): qty 1

## 2018-03-09 MED ORDER — OXYCODONE HCL 5 MG PO TABS: 5.0000 mg | ORAL_TABLET | Freq: Once | ORAL | Status: DC | PRN

## 2018-03-09 MED ORDER — LIDOCAINE 2% (20 MG/ML) 5 ML SYRINGE
INTRAMUSCULAR | Status: AC
  Filled 2018-03-09: qty 10

## 2018-03-09 MED ORDER — FENTANYL CITRATE (PF) 100 MCG/2ML IJ SOLN
INTRAMUSCULAR | Status: DC | PRN
  Administered 2018-03-09 (×2): 50 ug via INTRAVENOUS
  Administered 2018-03-09: 100 ug via INTRAVENOUS
  Administered 2018-03-09: 50 ug via INTRAVENOUS

## 2018-03-09 MED ORDER — APIXABAN 2.5 MG PO TABS: 2.5000 mg | ORAL_TABLET | Freq: Two times a day (BID) | ORAL | 0 refills | Status: DC

## 2018-03-09 MED ORDER — MECLIZINE HCL 25 MG PO TABS: 12.5000 mg | ORAL_TABLET | Freq: Three times a day (TID) | ORAL | Status: DC | PRN

## 2018-03-09 MED ORDER — OXYCODONE HCL 5 MG PO TABS
5.0000 mg | ORAL_TABLET | ORAL | Status: DC | PRN
  Administered 2018-03-09: 5 mg via ORAL
  Administered 2018-03-10 – 2018-03-11 (×6): 10 mg via ORAL
  Filled 2018-03-09: qty 1
  Filled 2018-03-09 (×6): qty 2

## 2018-03-09 MED ORDER — TIZANIDINE HCL 2 MG PO TABS: 2.0000 mg | ORAL_TABLET | Freq: Four times a day (QID) | ORAL | 0 refills | Status: DC | PRN

## 2018-03-09 MED ORDER — FLEET ENEMA 7-19 GM/118ML RE ENEM: 1.0000 | ENEMA | Freq: Once | RECTAL | Status: DC | PRN

## 2018-03-09 MED ORDER — BUPIVACAINE-EPINEPHRINE (PF) 0.5% -1:200000 IJ SOLN
INTRAMUSCULAR | Status: DC | PRN
  Administered 2018-03-09 (×2): 10 mL

## 2018-03-09 MED ORDER — DEXAMETHASONE SODIUM PHOSPHATE 10 MG/ML IJ SOLN
INTRAMUSCULAR | Status: DC | PRN
  Administered 2018-03-09: 10 mg via INTRAVENOUS

## 2018-03-09 MED ORDER — HYDROMORPHONE HCL 1 MG/ML IJ SOLN
INTRAMUSCULAR | Status: AC
  Administered 2018-03-09: 1 mg via INTRAVENOUS
  Filled 2018-03-09: qty 1

## 2018-03-09 MED ORDER — PHENOL 1.4 % MT LIQD: 1.0000 | OROMUCOSAL | Status: DC | PRN

## 2018-03-09 MED ORDER — TRANEXAMIC ACID 1000 MG/10ML IV SOLN
1000.0000 mg | Freq: Once | INTRAVENOUS | Status: AC
  Administered 2018-03-09: 1000 mg via INTRAVENOUS
  Filled 2018-03-09: qty 1100

## 2018-03-09 MED ORDER — GABAPENTIN 100 MG PO CAPS
200.0000 mg | ORAL_CAPSULE | Freq: Every day | ORAL | Status: DC
Start: 2018-03-09 — End: 2018-03-11
  Administered 2018-03-10: 200 mg via ORAL
  Filled 2018-03-09 (×2): qty 2

## 2018-03-09 MED ORDER — METHOCARBAMOL 1000 MG/10ML IJ SOLN
500.0000 mg | Freq: Four times a day (QID) | INTRAVENOUS | Status: DC | PRN
  Administered 2018-03-09: 500 mg via INTRAVENOUS
  Filled 2018-03-09: qty 550

## 2018-03-09 MED ORDER — ROCURONIUM BROMIDE 100 MG/10ML IV SOLN
INTRAVENOUS | Status: AC
  Filled 2018-03-09: qty 1

## 2018-03-09 MED ORDER — ALUM & MAG HYDROXIDE-SIMETH 200-200-20 MG/5ML PO SUSP: 30.0000 mL | ORAL | Status: DC | PRN

## 2018-03-09 MED ORDER — METHOCARBAMOL 500 MG PO TABS
500.0000 mg | ORAL_TABLET | Freq: Four times a day (QID) | ORAL | Status: DC | PRN
  Administered 2018-03-10 – 2018-03-11 (×4): 500 mg via ORAL
  Filled 2018-03-09 (×4): qty 1

## 2018-03-09 MED ORDER — SUCCINYLCHOLINE CHLORIDE 200 MG/10ML IV SOSY
PREFILLED_SYRINGE | INTRAVENOUS | Status: DC | PRN
  Administered 2018-03-09: 120 mg via INTRAVENOUS

## 2018-03-09 MED ORDER — OXYCODONE-ACETAMINOPHEN 5-325 MG PO TABS: 1.0000 | ORAL_TABLET | ORAL | 0 refills | Status: DC | PRN

## 2018-03-09 MED ORDER — SODIUM CHLORIDE 0.9 % IJ SOLN
INTRAMUSCULAR | Status: AC
  Filled 2018-03-09: qty 50

## 2018-03-09 MED ORDER — WATER FOR IRRIGATION, STERILE IR SOLN
Status: DC | PRN
  Administered 2018-03-09: 2000 mL

## 2018-03-09 MED ORDER — LIDOCAINE 2% (20 MG/ML) 5 ML SYRINGE
INTRAMUSCULAR | Status: DC | PRN
  Administered 2018-03-09: 80 mg via INTRAVENOUS

## 2018-03-09 MED ORDER — PROPOFOL 10 MG/ML IV BOLUS
INTRAVENOUS | Status: DC | PRN
  Administered 2018-03-09: 150 mg via INTRAVENOUS
  Administered 2018-03-09: 100 mg via INTRAVENOUS
  Administered 2018-03-09: 200 mg via INTRAVENOUS

## 2018-03-09 MED ORDER — MIDAZOLAM HCL 2 MG/2ML IJ SOLN
2.0000 mg | Freq: Once | INTRAMUSCULAR | Status: AC
  Administered 2018-03-09: 2 mg via INTRAVENOUS

## 2018-03-09 MED ORDER — DIPHENHYDRAMINE HCL 12.5 MG/5ML PO ELIX: 12.5000 mg | ORAL_SOLUTION | ORAL | Status: DC | PRN

## 2018-03-09 MED ORDER — 0.9 % SODIUM CHLORIDE (POUR BTL) OPTIME
TOPICAL | Status: DC | PRN
  Administered 2018-03-09: 1000 mL

## 2018-03-09 MED ORDER — LACTATED RINGERS IV SOLN: INTRAVENOUS | Status: DC

## 2018-03-09 MED ORDER — SODIUM CHLORIDE 0.9 % IJ SOLN
INTRAMUSCULAR | Status: DC | PRN
  Administered 2018-03-09: 50 mL

## 2018-03-09 MED ORDER — FENTANYL CITRATE (PF) 100 MCG/2ML IJ SOLN
INTRAMUSCULAR | Status: AC
  Administered 2018-03-09: 50 ug via INTRAVENOUS
  Filled 2018-03-09: qty 2

## 2018-03-09 MED ORDER — ROPIVACAINE HCL 5 MG/ML IJ SOLN
INTRAMUSCULAR | Status: DC | PRN
  Administered 2018-03-09: 20 mL via PERINEURAL

## 2018-03-09 MED ORDER — HYDROMORPHONE HCL 1 MG/ML IJ SOLN
0.2500 mg | INTRAMUSCULAR | Status: DC | PRN
  Administered 2018-03-09 (×2): 0.5 mg via INTRAVENOUS

## 2018-03-09 MED ORDER — BUPIVACAINE-EPINEPHRINE (PF) 0.5% -1:200000 IJ SOLN
INTRAMUSCULAR | Status: AC
  Filled 2018-03-09: qty 30

## 2018-03-09 MED ORDER — SUCCINYLCHOLINE CHLORIDE 200 MG/10ML IV SOSY
PREFILLED_SYRINGE | INTRAVENOUS | Status: AC
  Filled 2018-03-09: qty 10

## 2018-03-09 MED ORDER — FENTANYL CITRATE (PF) 100 MCG/2ML IJ SOLN
100.0000 ug | Freq: Once | INTRAMUSCULAR | Status: AC
  Administered 2018-03-09: 50 ug via INTRAVENOUS

## 2018-03-09 MED ORDER — MIDAZOLAM HCL 2 MG/2ML IJ SOLN
INTRAMUSCULAR | Status: AC
  Filled 2018-03-09: qty 2

## 2018-03-09 MED ORDER — MIDAZOLAM HCL 5 MG/5ML IJ SOLN
INTRAMUSCULAR | Status: DC | PRN
  Administered 2018-03-09: 2 mg via INTRAVENOUS

## 2018-03-09 MED ORDER — HYDROMORPHONE HCL 1 MG/ML IJ SOLN
0.5000 mg | INTRAMUSCULAR | Status: DC | PRN
  Administered 2018-03-09: 1 mg via INTRAVENOUS
  Filled 2018-03-09: qty 1

## 2018-03-09 MED ORDER — BENAZEPRIL HCL 10 MG PO TABS
20.0000 mg | ORAL_TABLET | Freq: Every day | ORAL | Status: DC
  Administered 2018-03-10 – 2018-03-11 (×2): 20 mg via ORAL
  Filled 2018-03-09 (×3): qty 2

## 2018-03-09 MED ORDER — PAROXETINE HCL 20 MG PO TABS
40.0000 mg | ORAL_TABLET | Freq: Every day | ORAL | Status: DC
  Administered 2018-03-09 – 2018-03-10 (×2): 40 mg via ORAL
  Filled 2018-03-09 (×2): qty 2

## 2018-03-09 MED ORDER — SUGAMMADEX SODIUM 200 MG/2ML IV SOLN
INTRAVENOUS | Status: AC
  Filled 2018-03-09: qty 2

## 2018-03-09 MED ORDER — PROPOFOL 10 MG/ML IV BOLUS
INTRAVENOUS | Status: AC
  Filled 2018-03-09: qty 20

## 2018-03-09 MED ORDER — SODIUM CHLORIDE 0.9 % IV SOLN
INTRAVENOUS | Status: DC | PRN
  Administered 2018-03-09: 2000 mg via TOPICAL

## 2018-03-09 MED ORDER — ONDANSETRON HCL 4 MG PO TABS: 4.0000 mg | ORAL_TABLET | Freq: Four times a day (QID) | ORAL | Status: DC | PRN

## 2018-03-09 MED ORDER — MENTHOL 3 MG MT LOZG: 1.0000 | LOZENGE | OROMUCOSAL | Status: DC | PRN

## 2018-03-09 MED ORDER — BISACODYL 5 MG PO TBEC: 5.0000 mg | DELAYED_RELEASE_TABLET | Freq: Every day | ORAL | Status: DC | PRN

## 2018-03-09 MED ORDER — ROCURONIUM BROMIDE 100 MG/10ML IV SOLN
INTRAVENOUS | Status: DC | PRN
  Administered 2018-03-09: 50 mg via INTRAVENOUS

## 2018-03-09 MED ORDER — POLYETHYLENE GLYCOL 3350 17 G PO PACK: 17.0000 g | PACK | Freq: Every day | ORAL | Status: DC | PRN

## 2018-03-09 MED ORDER — KCL IN DEXTROSE-NACL 20-5-0.45 MEQ/L-%-% IV SOLN
INTRAVENOUS | Status: DC
  Administered 2018-03-09: 17:00:00 via INTRAVENOUS
  Administered 2018-03-10: 1 mL via INTRAVENOUS
  Filled 2018-03-09 (×3): qty 1000

## 2018-03-09 MED ORDER — PROMETHAZINE HCL 25 MG/ML IJ SOLN: 6.2500 mg | INTRAMUSCULAR | Status: DC | PRN

## 2018-03-09 MED ORDER — ONDANSETRON HCL 4 MG/2ML IJ SOLN: 4.0000 mg | Freq: Four times a day (QID) | INTRAMUSCULAR | Status: DC | PRN

## 2018-03-09 MED ORDER — OXYCODONE HCL 5 MG/5ML PO SOLN: 5.0000 mg | Freq: Once | ORAL | Status: DC | PRN

## 2018-03-09 MED ORDER — MEPERIDINE HCL 50 MG/ML IJ SOLN: 6.2500 mg | INTRAMUSCULAR | Status: DC | PRN

## 2018-03-09 MED ORDER — DOCUSATE SODIUM 100 MG PO CAPS
100.0000 mg | ORAL_CAPSULE | Freq: Two times a day (BID) | ORAL | Status: DC
  Administered 2018-03-09 – 2018-03-11 (×4): 100 mg via ORAL
  Filled 2018-03-09 (×4): qty 1

## 2018-03-09 MED ORDER — AMLODIPINE BESYLATE 10 MG PO TABS
10.0000 mg | ORAL_TABLET | Freq: Every day | ORAL | Status: DC
  Administered 2018-03-10 – 2018-03-11 (×2): 10 mg via ORAL
  Filled 2018-03-09 (×2): qty 1

## 2018-03-09 MED ORDER — METOCLOPRAMIDE HCL 5 MG PO TABS: 5.0000 mg | ORAL_TABLET | Freq: Three times a day (TID) | ORAL | Status: DC | PRN

## 2018-03-09 MED ORDER — APIXABAN 2.5 MG PO TABS
2.5000 mg | ORAL_TABLET | Freq: Two times a day (BID) | ORAL | Status: DC
  Administered 2018-03-10 – 2018-03-11 (×3): 2.5 mg via ORAL
  Filled 2018-03-09 (×3): qty 1

## 2018-03-09 MED ORDER — FENTANYL CITRATE (PF) 250 MCG/5ML IJ SOLN
INTRAMUSCULAR | Status: AC
  Filled 2018-03-09: qty 5

## 2018-03-09 MED ORDER — LACTATED RINGERS IV SOLN
INTRAVENOUS | Status: DC
  Administered 2018-03-09 (×3): via INTRAVENOUS

## 2018-03-09 SURGICAL SUPPLY — 46 items
BAG DECANTER FOR FLEXI CONT (MISCELLANEOUS) ×3 IMPLANT
BAG SPEC THK2 15X12 ZIP CLS (MISCELLANEOUS) ×1
BAG ZIPLOCK 12X15 (MISCELLANEOUS) ×3 IMPLANT
BLADE SAG 18X100X1.27 (BLADE) ×3 IMPLANT
BLADE SAW SGTL 13.0X1.19X90.0M (BLADE) ×3 IMPLANT
BNDG CMPR MED 10X6 ELC LF (GAUZE/BANDAGES/DRESSINGS) ×1
BNDG ELASTIC 6X10 VLCR STRL LF (GAUZE/BANDAGES/DRESSINGS) ×3 IMPLANT
BOWL SMART MIX CTS (DISPOSABLE) ×3 IMPLANT
CAPT KNEE TOTAL 3 ATTUNE ×3 IMPLANT
CEMENT HV SMART SET (Cement) ×6 IMPLANT
COVER SURGICAL LIGHT HANDLE (MISCELLANEOUS) ×3 IMPLANT
CUFF TOURN SGL QUICK 34 (TOURNIQUET CUFF) ×3
CUFF TRNQT CYL 34X4X40X1 (TOURNIQUET CUFF) ×1 IMPLANT
DECANTER SPIKE VIAL GLASS SM (MISCELLANEOUS) ×9 IMPLANT
DRAPE ORTHO SPLIT 77X108 STRL (DRAPES) ×3
DRAPE SURG ORHT 6 SPLT 77X108 (DRAPES) ×1 IMPLANT
DRAPE U-SHAPE 47X51 STRL (DRAPES) ×3 IMPLANT
DRSG AQUACEL AG ADV 3.5X10 (GAUZE/BANDAGES/DRESSINGS) ×3 IMPLANT
DURAPREP 26ML APPLICATOR (WOUND CARE) ×3 IMPLANT
ELECT REM PT RETURN 15FT ADLT (MISCELLANEOUS) ×3 IMPLANT
GLOVE BIO SURGEON STRL SZ7.5 (GLOVE) ×3 IMPLANT
GLOVE BIO SURGEON STRL SZ8.5 (GLOVE) ×6 IMPLANT
GLOVE BIOGEL PI IND STRL 8 (GLOVE) ×1 IMPLANT
GLOVE BIOGEL PI IND STRL 9 (GLOVE) ×1 IMPLANT
GLOVE BIOGEL PI INDICATOR 8 (GLOVE) ×2
GLOVE BIOGEL PI INDICATOR 9 (GLOVE) ×2
GOWN STRL REUS W/TWL XL LVL3 (GOWN DISPOSABLE) ×6 IMPLANT
HANDPIECE INTERPULSE COAX TIP (DISPOSABLE) ×3
HOLDER FOLEY CATH W/STRAP (MISCELLANEOUS) IMPLANT
HOOD PEEL AWAY FLYTE STAYCOOL (MISCELLANEOUS) ×9 IMPLANT
NEEDLE HYPO 22GX1.5 SAFETY (NEEDLE) ×3 IMPLANT
NS IRRIG 1000ML POUR BTL (IV SOLUTION) ×3 IMPLANT
PACK ICE MAXI GEL EZY WRAP (MISCELLANEOUS) ×3 IMPLANT
PACK TOTAL KNEE CUSTOM (KITS) ×3 IMPLANT
POSITIONER SURGICAL ARM (MISCELLANEOUS) ×3 IMPLANT
SET HNDPC FAN SPRY TIP SCT (DISPOSABLE) ×1 IMPLANT
STAPLER VISISTAT 35W (STAPLE) IMPLANT
SUT VIC AB 1 CTX 36 (SUTURE) ×3
SUT VIC AB 1 CTX36XBRD ANBCTR (SUTURE) ×1 IMPLANT
SUT VIC AB 2-0 CT1 27 (SUTURE) ×3
SUT VIC AB 2-0 CT1 TAPERPNT 27 (SUTURE) ×1 IMPLANT
SUT VIC AB 3-0 CT1 27 (SUTURE) ×3
SUT VIC AB 3-0 CT1 TAPERPNT 27 (SUTURE) ×1 IMPLANT
SYR CONTROL 10ML LL (SYRINGE) ×6 IMPLANT
TRAY FOLEY CATH 16FRSI W/METER (SET/KITS/TRAYS/PACK) IMPLANT
WATER STERILE IRR 1000ML POUR (IV SOLUTION) ×6 IMPLANT

## 2018-03-09 NOTE — Anesthesia Preprocedure Evaluation (Signed)
Anesthesia Evaluation  Patient identified by MRN, date of birth, ID band Patient awake    Reviewed: Allergy & Precautions, NPO status , Patient's Chart, lab work & pertinent test results  Airway Mallampati: II  TM Distance: >3 FB Neck ROM: Full    Dental  (+) Dental Advisory Given, Missing, Poor Dentition, Chipped   Pulmonary sleep apnea , former smoker,  Not using CPAP at home   breath sounds clear to auscultation       Cardiovascular hypertension, Pt. on medications  Rhythm:Regular Rate:Normal     Neuro/Psych Anxiety Depression    GI/Hepatic hiatal hernia, GERD  Medicated and Controlled,  Endo/Other    Renal/GU      Musculoskeletal  (+) Arthritis , Osteoarthritis,    Abdominal   Peds  Hematology   Anesthesia Other Findings   Reproductive/Obstetrics                             Anesthesia Physical  Anesthesia Plan  ASA: II  Anesthesia Plan: General   Post-op Pain Management: GA combined w/ Regional for post-op pain   Induction: Intravenous  PONV Risk Score and Plan: 3 and Ondansetron, Dexamethasone and Midazolam  Airway Management Planned: LMA  Additional Equipment:   Intra-op Plan:   Post-operative Plan: Extubation in OR  Informed Consent: I have reviewed the patients History and Physical, chart, labs and discussed the procedure including the risks, benefits and alternatives for the proposed anesthesia with the patient or authorized representative who has indicated his/her understanding and acceptance.   Dental advisory given  Plan Discussed with: CRNA, Anesthesiologist and Surgeon  Anesthesia Plan Comments:         Anesthesia Quick Evaluation

## 2018-03-09 NOTE — Discharge Instructions (Addendum)
Information on my medicine - ELIQUIS (apixaban)  Why was Eliquis prescribed for you? Eliquis was prescribed for you to reduce the risk of blood clots forming after orthopedic surgery.    What do You need to know about Eliquis? Take your Eliquis TWICE DAILY - one tablet in the morning and one tablet in the evening with or without food.  It would be best to take the dose about the same time each day.  If you have difficulty swallowing the tablet whole please discuss with your pharmacist how to take the medication safely.  Take Eliquis exactly as prescribed by your doctor and DO NOT stop taking Eliquis without talking to the doctor who prescribed the medication.  Stopping without other medication to take the place of Eliquis may increase your risk of developing a clot.  After discharge, you should have regular check-up appointments with your healthcare provider that is prescribing your Eliquis.  What do you do if you miss a dose? If a dose of ELIQUIS is not taken at the scheduled time, take it as soon as possible on the same day and twice-daily administration should be resumed.  The dose should not be doubled to make up for a missed dose.  Do not take more than one tablet of ELIQUIS at the same time.  Important Safety Information A possible side effect of Eliquis is bleeding. You should call your healthcare provider right away if you experience any of the following: ? Bleeding from an injury or your nose that does not stop. ? Unusual colored urine (red or dark brown) or unusual colored stools (red or black). ? Unusual bruising for unknown reasons. ? A serious fall or if you hit your head (even if there is no bleeding).  Some medicines may interact with Eliquis and might increase your risk of bleeding or clotting while on Eliquis. To help avoid this, consult your healthcare provider or pharmacist prior to using any new prescription or non-prescription medications, including herbals,  vitamins, non-steroidal anti-inflammatory drugs (NSAIDs) and supplements.  This website has more information on Eliquis (apixaban): http://www.eliquis.com/eliquis/home   INSTRUCTIONS AFTER JOINT REPLACEMENT   o Remove items at home which could result in a fall. This includes throw rugs or furniture in walking pathways o ICE to the affected joint every three hours while awake for 30 minutes at a time, for at least the first 3-5 days, and then as needed for pain and swelling.  Continue to use ice for pain and swelling. You may notice swelling that will progress down to the foot and ankle.  This is normal after surgery.  Elevate your leg when you are not up walking on it.   o Continue to use the breathing machine you got in the hospital (incentive spirometer) which will help keep your temperature down.  It is common for your temperature to cycle up and down following surgery, especially at night when you are not up moving around and exerting yourself.  The breathing machine keeps your lungs expanded and your temperature down.   DIET:  As you were doing prior to hospitalization, we recommend a well-balanced diet.  DRESSING / WOUND CARE / SHOWERING  Keep the surgical dressing until follow up.  The dressing is water proof, so you can shower without any extra covering.  IF THE DRESSING FALLS OFF or the wound gets wet inside, change the dressing with sterile gauze.  Please use good hand washing techniques before changing the dressing.  Do not use any lotions  or creams on the incision until instructed by your surgeon.    ACTIVITY  o Increase activity slowly as tolerated, but follow the weight bearing instructions below.   o No driving for 6 weeks or until further direction given by your physician.  You cannot drive while taking narcotics.  o No lifting or carrying greater than 10 lbs. until further directed by your surgeon. o Avoid periods of inactivity such as sitting longer than an hour when not  asleep. This helps prevent blood clots.  o You may return to work once you are authorized by your doctor.     WEIGHT BEARING   Weight bearing as tolerated with assist device (walker, cane, etc) as directed, use it as long as suggested by your surgeon or therapist, typically at least 4-6 weeks.   EXERCISES  Results after joint replacement surgery are often greatly improved when you follow the exercise, range of motion and muscle strengthening exercises prescribed by your doctor. Safety measures are also important to protect the joint from further injury. Any time any of these exercises cause you to have increased pain or swelling, decrease what you are doing until you are comfortable again and then slowly increase them. If you have problems or questions, call your caregiver or physical therapist for advice.   Rehabilitation is important following a joint replacement. After just a few days of immobilization, the muscles of the leg can become weakened and shrink (atrophy).  These exercises are designed to build up the tone and strength of the thigh and leg muscles and to improve motion. Often times heat used for twenty to thirty minutes before working out will loosen up your tissues and help with improving the range of motion but do not use heat for the first two weeks following surgery (sometimes heat can increase post-operative swelling).   These exercises can be done on a training (exercise) mat, on the floor, on a table or on a bed. Use whatever works the best and is most comfortable for you.    Use music or television while you are exercising so that the exercises are a pleasant break in your day. This will make your life better with the exercises acting as a break in your routine that you can look forward to.   Perform all exercises about fifteen times, three times per day or as directed.  You should exercise both the operative leg and the other leg as well.  Exercises include:    Quad Sets -  Tighten up the muscle on the front of the thigh (Quad) and hold for 5-10 seconds.    Straight Leg Raises - With your knee straight (if you were given a brace, keep it on), lift the leg to 60 degrees, hold for 3 seconds, and slowly lower the leg.  Perform this exercise against resistance later as your leg gets stronger.   Leg Slides: Lying on your back, slowly slide your foot toward your buttocks, bending your knee up off the floor (only go as far as is comfortable). Then slowly slide your foot back down until your leg is flat on the floor again.   Angel Wings: Lying on your back spread your legs to the side as far apart as you can without causing discomfort.   Hamstring Strength:  Lying on your back, push your heel against the floor with your leg straight by tightening up the muscles of your buttocks.  Repeat, but this time bend your knee to a comfortable angle,  and push your heel against the floor.  You may put a pillow under the heel to make it more comfortable if necessary.   A rehabilitation program following joint replacement surgery can speed recovery and prevent re-injury in the future due to weakened muscles. Contact your doctor or a physical therapist for more information on knee rehabilitation.    CONSTIPATION  Constipation is defined medically as fewer than three stools per week and severe constipation as less than one stool per week.  Even if you have a regular bowel pattern at home, your normal regimen is likely to be disrupted due to multiple reasons following surgery.  Combination of anesthesia, postoperative narcotics, change in appetite and fluid intake all can affect your bowels.   YOU MUST use at least one of the following options; they are listed in order of increasing strength to get the job done.  They are all available over the counter, and you may need to use some, POSSIBLY even all of these options:    Drink plenty of fluids (prune juice may be helpful) and high fiber  foods Colace 100 mg by mouth twice a day  Senokot for constipation as directed and as needed Dulcolax (bisacodyl), take with full glass of water  Miralax (polyethylene glycol) once or twice a day as needed.  If you have tried all these things and are unable to have a bowel movement in the first 3-4 days after surgery call either your surgeon or your primary doctor.    If you experience loose stools or diarrhea, hold the medications until you stool forms back up.  If your symptoms do not get better within 1 week or if they get worse, check with your doctor.  If you experience "the worst abdominal pain ever" or develop nausea or vomiting, please contact the office immediately for further recommendations for treatment.   ITCHING:  If you experience itching with your medications, try taking only a single pain pill, or even half a pain pill at a time.  You can also use Benadryl over the counter for itching or also to help with sleep.   TED HOSE STOCKINGS:  Use stockings on both legs until for at least 2 weeks or as directed by physician office. They may be removed at night for sleeping.  MEDICATIONS:  See your medication summary on the After Visit Summary that nursing will review with you.  You may have some home medications which will be placed on hold until you complete the course of blood thinner medication.  It is important for you to complete the blood thinner medication as prescribed.  PRECAUTIONS:  If you experience chest pain or shortness of breath - call 911 immediately for transfer to the hospital emergency department.   If you develop a fever greater that 101 F, purulent drainage from wound, increased redness or drainage from wound, foul odor from the wound/dressing, or calf pain - CONTACT YOUR SURGEON.                                                   FOLLOW-UP APPOINTMENTS:  If you do not already have a post-op appointment, please call the office for an appointment to be seen by your  surgeon.  Guidelines for how soon to be seen are listed in your After Visit Summary, but are typically between 1-4  weeks after surgery.  OTHER INSTRUCTIONS:   Knee Replacement:  Do not place pillow under knee, focus on keeping the knee straight while resting. CPM instructions: 0-90 degrees, 2 hours in the morning, 2 hours in the afternoon, and 2 hours in the evening. Place foam block, curve side up under heel at all times except when in CPM or when walking.  DO NOT modify, tear, cut, or change the foam block in any way.  MAKE SURE YOU:   Understand these instructions.   Get help right away if you are not doing well or get worse.    Thank you for letting us be a part of your medical care team.  It is a privilege we respect greatly.  We hope these instructions will help you stay on track for a fast and full recovery!

## 2018-03-09 NOTE — Anesthesia Postprocedure Evaluation (Signed)
Anesthesia Post Note  Patient: Lynn [name]  Procedure(s) Performed: LEFT TOTAL KNEE ARTHROPLASTY (Left Knee)     Patient location during evaluation: PACU Anesthesia Type: General Level of consciousness: awake and alert Pain management: pain level controlled Vital Signs Assessment: post-procedure vital signs reviewed and stable Respiratory status: spontaneous breathing, nonlabored ventilation and respiratory function stable Cardiovascular status: blood pressure returned to baseline and stable Postop Assessment: no apparent nausea or vomiting Anesthetic complications: no    Last Vitals:  Vitals:   03/09/18 1029 03/09/18 1322  BP: 115/75 (!) 162/90  Pulse: 64 (!) 103  Resp: 13 12  Temp:  36.6 C  SpO2: 98% 96%    Last Pain:  Vitals:   03/09/18 1415  TempSrc:   PainSc: Eclectic

## 2018-03-09 NOTE — Progress Notes (Signed)
[facility]  Patient Status: New  [facility] is providing the following services: PT  If patient discharges after hours, please call [phone].   Edwinna Areola 03/09/2018, 10:32 AM

## 2018-03-09 NOTE — Anesthesia Procedure Notes (Signed)
Anesthesia Regional Block: Adductor canal block   Pre-Anesthetic Checklist: ,, timeout performed, Correct Patient, Correct Site, Correct Laterality, Correct Procedure, Correct Position, site marked, Risks and benefits discussed,  Surgical consent,  Pre-op evaluation,  At surgeon's request and post-op pain management  Laterality: Left  Prep: chloraprep       Needles:  Injection technique: Single-shot  Needle Type: Stimiplex     Needle Length: 9cm  Needle Gauge: 21     Additional Needles:   Procedures:,,,, ultrasound used (permanent image in chart),,,,  Narrative:  Start time: 03/09/2018 9:38 AM End time: 03/09/2018 9:43 AM Injection made incrementally with aspirations every 5 mL.  Performed by: Personally  Anesthesiologist: Lynda Rainwater, MD

## 2018-03-09 NOTE — Transfer of Care (Signed)
Immediate Anesthesia Transfer of Care Note  Patient: Lynn [name]  Procedure(s) Performed: LEFT TOTAL KNEE ARTHROPLASTY (Left Knee)  Patient Location: PACU  Anesthesia Type:General  Level of Consciousness: awake, alert  and oriented  Airway & Oxygen Therapy: Patient Spontanous Breathing and Patient connected to face mask oxygen  Post-op Assessment: Report given to RN and Post -op Vital signs reviewed and stable  Post vital signs: Reviewed and stable  Last Vitals:  Vitals Value Taken Time  BP 162/90 03/09/2018  1:22 PM  Temp    Pulse 100 03/09/2018  1:23 PM  Resp 12 03/09/2018  1:23 PM  SpO2 96 % 03/09/2018  1:23 PM  Vitals shown include unvalidated device data.  Last Pain:  Vitals:   03/09/18 0950  TempSrc:   PainSc: 0-No pain      Patients Stated Pain Goal: 5 (67/20/91 [id number])  Complications: No apparent anesthesia complications

## 2018-03-09 NOTE — Op Note (Signed)
PATIENT ID:      Lynn [name]  MRN:     [id number] DOB/AGE:    [birth year] / 67 y.o.       OPERATIVE REPORT    DATE OF PROCEDURE:  03/09/2018       PREOPERATIVE DIAGNOSIS:   POST TRAUMATIC OSTEOARTHRITIS LEFT KNEE      Estimated body mass index is 34.58 kg/m as calculated from the following:   Height as of 03/06/18: 5\' 1"  (1.549 m).   Weight as of 03/06/18: 183 lb (83 kg).                                                        POSTOPERATIVE DIAGNOSIS:   POST TRAUMATIC OSTEOARTHRITIS LEFT KNEE                                                                      PROCEDURE:  Procedure(s): LEFT TOTAL KNEE ARTHROPLASTY Using DepuyAttune RP implants #5L Femur, #5Tibia, 5 mm Attune RP bearing, 41 Patella     SURGEON: Kerin Salen    ASSISTANT:   Kerry Hough. [employer]   (Present and scrubbed throughout the case, critical for assistance with exposure, retraction, instrumentation, and closure.)         ANESTHESIA: GET, 20cc Exparel, 50cc 0.25% Marcaine  EBL: 300cc  FLUID REPLACEMENT: 1500 crystalloid  TOURNIQUET TIME: none Drains: None  Tranexamic Acid: 1gm IV, 2gm topical  COMPLICATIONS:  None         INDICATIONS FOR PROCEDURE: The patient has  POST TRAUMATIC OSTEOARTHRITIS LEFT KNEE, Var deformities, XR shows bone on bone arthritis, lateral subluxation of tibia. Patient has failed all conservative measures including anti-inflammatory medicines, narcotics, attempts at  exercise and weight loss, cortisone injections and viscosupplementation.  Risks and benefits of surgery have been discussed, questions answered.   DESCRIPTION OF PROCEDURE: The patient identified by armband, received  IV antibiotics, in the holding area at [facility]. Patient taken to the operating room, appropriate anesthetic  monitors were attached, and GET anesthesia was  induced. Tourniquet  applied high to the operative thigh. Lateral post and foot positioner  applied to the table, the lower extremity was then  prepped and draped  in usual sterile fashion from the toes to the tourniquet. Time-out procedure was performed. We began the operation, with the knee flexed 120 degrees, by making the anterior midline incision starting at handbreadth above the patella going over the patella 1 cm medial to and 4 cm distal to the tibial tubercle. Small bleeders in the skin and the  subcutaneous tissue identified and cauterized. Transverse retinaculum was incised and reflected medially and a medial parapatellar arthrotomy was accomplished. the patella was everted and theprepatellar fat pad resected. The superficial medial collateral  ligament was then elevated from anterior to posterior along the proximal  flare of the tibia and anterior half of the menisci resected. The knee was hyperflexed exposing bone on bone arthritis. Peripheral and notch osteophytes as well as the cruciate ligaments were then resected. We continued to  work our way around  posteriorly along the proximal tibia, and externally  rotated the tibia subluxing it out from underneath the femur. A McHale  retractor was placed through the notch and a lateral Hohmann retractor  placed, and we then drilled through the proximal tibia in line with the  axis of the tibia followed by an intramedullary guide rod and 2-degree  posterior slope cutting guide. The tibial cutting guide, 3 degree posterior sloped, was pinned into place allowing resection of 4 mm of bone medially and 8 mm of bone laterally. Satisfied with the tibial resection, we then  entered the distal femur 2 mm anterior to the PCL origin with the  intramedullary guide rod and applied the distal femoral cutting guide  set at 9 mm, with 5 degrees of valgus. This was pinned along the  epicondylar axis. At this point, the distal femoral cut was accomplished without difficulty. We then sized for a #5L femoral component and pinned the guide in 3 degrees of external rotation. The chamfer cutting guide was  pinned into place. The anterior, posterior, and chamfer cuts were accomplished without difficulty followed by  the Attune RP box cutting guide and the box cut. We also removed posterior osteophytes from the posterior femoral condyles. At this  time, the knee was brought into full extension. We checked our  extension and flexion gaps and found them symmetric for a 5 mm bearing. Distracting in extension with a lamina spreader, the posterior horns of the menisci were removed, and Exparel, diluted to 60 cc, with 20cc NS, and 20cc 0.5% Marcaine,was injected into the capsule and synovium of the knee. The posterior patella cut was accomplished with the 9.5 mm Attune cutting guide, sized for a 14mm dome, and the fixation pegs drilled.The knee  was then once again hyperflexed exposing the proximal tibia. We sized for a # 5 tibial base plate, applied the smokestack and the conical reamer followed by the the Delta fin keel punch. We then hammered into place the Attune RP trial femoral component, drilled the lugs, inserted a  5 mm trial bearing, trial patellar button, and took the knee through range of motion from 0-130 degrees. No thumb pressure was required for patellar Tracking.  All trial components were removed, mating surfaces irrigated with pulse lavage, and dried with suction and sponges. 10 cc of the Exparel solution was applied to the cancellus bone of the patella distal femur and proximal tibia.  After waiting 1 minute, the bony surfaces were again, dried with sponges. A double batch of DePuy HV cement with 1500 mg of Zinacef was mixed and applied to all bony metallic mating surfaces except for the posterior condyles of the femur itself. In order, we hammered into place the tibial tray and removed excess cement, the femoral component and removed excess cement. The final Attune RP bearing  was inserted, and the knee brought to full extension with compression.  The patellar button was clamped into place, and  excess cement  removed. While the cement cured the wound was irrigated out with normal saline solution pulse lavage. Ligament stability and patellar tracking were checked and found to be excellent. The parapatellar arthrotomy was closed with  running #1 Vicryl suture. The subcutaneous tissue with 0 and 2-0 undyed  Vicryl suture, and the skin with running 3-0 SQ vicryl. A dressing of Xeroform,  4 x 4, dressing sponges, Webril, and Ace wrap applied. The patient  awakened, and taken to recovery room without difficulty.   Kerin Salen 03/09/2018, 12:54  PM   

## 2018-03-09 NOTE — Progress Notes (Signed)
Assisted Dr. Miller with left, ultrasound guided, adductor canal block. Side rails up, monitors on throughout procedure. See vital signs in flow sheet. Tolerated Procedure well.  

## 2018-03-09 NOTE — Interval H&P Note (Signed)
History and Physical Interval Note:  03/09/2018 10:09 AM  Lynn [name]  has presented today for surgery, with the diagnosis of POST TRAUMATIC OSTEOARTHRITIS LEFT KNEE  The various methods of treatment have been discussed with the patient and family. After consideration of risks, benefits and other options for treatment, the patient has consented to  Procedure(s): LEFT TOTAL KNEE ARTHROPLASTY (Left) as a surgical intervention .  The patient's history has been reviewed, patient examined, no change in status, stable for surgery.  I have reviewed the patient's chart and labs.  Questions were answered to the patient's satisfaction.     Kerin Salen

## 2018-03-09 NOTE — Anesthesia Procedure Notes (Signed)
Procedure Name: Intubation Date/Time: 03/09/2018 11:18 AM Performed by: Talbot Grumbling, CRNA Pre-anesthesia Checklist: Patient identified, Emergency Drugs available, Suction available and Patient being monitored Patient Re-evaluated:Patient Re-evaluated prior to induction Oxygen Delivery Method: Circle system utilized Preoxygenation: Pre-oxygenation with 100% oxygen Induction Type: IV induction Ventilation: Mask ventilation without difficulty Laryngoscope Size: Mac and 3 Grade View: Grade I Tube type: Oral Tube size: 7.5 mm Number of attempts: 1 (initially atttempted with 4.0 LMA without good seal, then attempted 4.0 supreme again without good seal. Elected to proceed with intubation) Airway Equipment and Method: Stylet Placement Confirmation: ETT inserted through vocal cords under direct vision,  positive ETCO2 and breath sounds checked- equal and bilateral Secured at: 22 cm Tube secured with: Tape Dental Injury: Teeth and Oropharynx as per pre-operative assessment

## 2018-03-10 ENCOUNTER — Encounter (HOSPITAL_COMMUNITY): Payer: Self-pay | Admitting: Orthopedic Surgery

## 2018-03-10 LAB — BASIC METABOLIC PANEL
ANION GAP: 4 — AB (ref 5–15)
BUN: 14 mg/dL (ref 8–23)
CHLORIDE: 105 mmol/L (ref 98–111)
CO2: 31 mmol/L (ref 22–32)
Calcium: 8.6 mg/dL — ABNORMAL LOW (ref 8.9–10.3)
Creatinine, Ser: 0.7 mg/dL (ref 0.44–1.00)
GFR calc non Af Amer: >60 mL/min (ref >60)
Glucose, Bld: 182 mg/dL — ABNORMAL HIGH (ref 70–99)
POTASSIUM: 4.6 mmol/L (ref 3.5–5.1)
Sodium: 140 mmol/L (ref 135–145)

## 2018-03-10 LAB — CBC
HEMATOCRIT: 37 % (ref 36.0–46.0)
HEMOGLOBIN: 12.2 g/dL (ref 12.0–15.0)
MCH: 30.7 pg (ref 26.0–34.0)
MCHC: 33 g/dL (ref 30.0–36.0)
MCV: 93 fL (ref 78.0–100.0)
Platelets: 209 10*3/uL (ref 150–400)
RBC: 3.98 MIL/uL (ref 3.87–5.11)
RDW: 13.3 % (ref 11.5–15.5)
WBC: 15.9 10*3/uL — ABNORMAL HIGH (ref 4.0–10.5)

## 2018-03-10 NOTE — Plan of Care (Signed)
  Problem: Education: Goal: Knowledge of General Education information will improve Outcome: Progressing   Problem: Health Behavior/Discharge Planning: Goal: Ability to manage health-related needs will improve Outcome: Progressing   Problem: Activity: Goal: Risk for activity intolerance will decrease Outcome: Progressing   Problem: Nutrition: Goal: Adequate nutrition will be maintained Outcome: Progressing   Problem: Coping: Goal: Level of anxiety will decrease Outcome: Progressing   Problem: Safety: Goal: Ability to remain free from injury will improve Outcome: Progressing   Problem: Education: Goal: Knowledge of the prescribed therapeutic regimen will improve Outcome: Progressing   Problem: Pain Management: Goal: Pain level will decrease with appropriate interventions Outcome: Progressing

## 2018-03-10 NOTE — Care Plan (Signed)
Patient progressing well. Anticipate discharge to home tomorrow as planned. Her post op plan is as follows:   Care Plan Notes 12/10/2017 to 03/10/2018       Care Plan by Ladell Heads at 03/06/2018  8:30 AM    Date of Service   Author Author Type Status Note Type File Time  03/06/2018 Addend [place], [place] 03/06/2018             Was able to reach patient by phone on 03/05/18. All of her phone numbers in the charts are incorrect. She states that she has family to assist her and all of her equipment. She plans to discharge to home with family and HHPT provided by [facility].  She will transition to [place] at [place]. Plan is in place as follows, however; she has not been to [facility] for pre-op and they are having a hard time reaching her as well. MD/PA updated.   HHPT - [facility] for 5 HH visits  OPPT - [facility] ([address])  03/25/18 @ 945 - patient is aware of this appointment.  MD follow up:  03/24/18 @ 900   Please contact [place], [place] [phone] with questions or if this plan should change.
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
## 2018-03-10 NOTE — Care Management Note (Signed)
Case Management Note  Patient Details  Name: Lynn [name] MRN: [id number] Date of Birth: [birth year]  Subjective/Objective:  Degenerative arthritis of left knee                  Action/Plan: Plan to discharge home with OP PT   Expected Discharge Date:                  Expected Discharge Plan:  OP Rehab  In-House Referral:     Discharge planning Services  CM Consult  Post Acute Care Choice:    Choice offered to:     DME Arranged:  Walker rolling DME Agency:  ([facility])  HH Arranged:    [place] Agency:     Status of Service:  Completed, signed off  If discussed at [employer], dates discussed:    Additional CommentsPurcell Mouton, RN 03/10/2018, 11:26 AM

## 2018-03-10 NOTE — Progress Notes (Signed)
PATIENT ID: Lynn [name]  MRN: [id number]  DOB/AGE:  65-Aug-1952 / 67 y.o.  1 Day Post-Op Procedure(s) (LRB): LEFT TOTAL KNEE ARTHROPLASTY (Left)    PROGRESS NOTE Subjective: Patient is alert, oriented, No Nausea, No Vomiting, yes passing gas. Taking PO Well. Denies SOB, Chest or Calf Pain. Using Incentive Spirometer, PAS in place. Ambulate Can do straight leg raise and that has been up to the bathroom, Patient reports pain as 5/10 .    Objective: Vital signs in last 24 hours: Vitals:   03/09/18 1809 03/09/18 2215 03/10/18 0220 03/10/18 0535  BP: 136/79 133/66 125/89 140/85  Pulse: 95 73 85 93  Resp: 18 17 17 16   Temp: 97.8 F (36.6 C) 98.4 F (36.9 C) 98.5 F (36.9 C) 98.4 F (36.9 C)  TempSrc: Oral Oral Oral Oral  SpO2: 94% 98% 99% 97%  Weight:      Height:          Intake/Output from previous day: I/O last 3 completed shifts: In: 3536 [P.O.:660; I.V.:3095; IV Piggyback:265] Out: 800 [Urine:600; Blood:200]   Intake/Output this shift: No intake/output data recorded.   LABORATORY DATA: Recent Labs    03/10/18 0530  WBC 15.9*  HGB 12.2  HCT 37.0  PLT 209  NA 140  K 4.6  CL 105  CO2 31  BUN 14  CREATININE 0.70  GLUCOSE 182*  CALCIUM 8.6*    Examination: Neurologically intact ABD soft Neurovascular intact Sensation intact distally Intact pulses distally Dorsiflexion/Plantar flexion intact Incision: dressing C/D/I No cellulitis present Compartment soft}  Assessment:   1 Day Post-Op Procedure(s) (LRB): LEFT TOTAL KNEE ARTHROPLASTY (Left) ADDITIONAL DIAGNOSIS: Expected Acute Blood Loss Anemia, Hypertension, Obesity, depression, posttraumatic stress disorder Anticipated LOS equal to or greater than 2 midnights due to - Age 65 and older with one or more of the following:  - Obesity  - Expected need for hospital services (PT, OT, Nursing) required for safe  discharge  - Mental health issues including depression and posttraumatic stress  disorder   Plan: PT/OT WBAT, AROM and PROM  DVT Prophylaxis:  SCDx72hrs, ASA 325 mg BID x 2 weeks DISCHARGE PLAN: Home, Hopefully tomorrow. DISCHARGE NEEDS: HHPT, Walker and 3-in-1 comode seat     Kerin Salen 03/10/2018, 7:30 AM
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
## 2018-03-11 LAB — CBC
HCT: 34.6 % — ABNORMAL LOW (ref 36.0–46.0)
HEMOGLOBIN: 11.3 g/dL — AB (ref 12.0–15.0)
MCH: 30.2 pg (ref 26.0–34.0)
MCHC: 32.7 g/dL (ref 30.0–36.0)
MCV: 92.5 fL (ref 78.0–100.0)
Platelets: 201 10*3/uL (ref 150–400)
RBC: 3.74 MIL/uL — ABNORMAL LOW (ref 3.87–5.11)
RDW: 13.8 % (ref 11.5–15.5)
WBC: 14.7 10*3/uL — ABNORMAL HIGH (ref 4.0–10.5)

## 2018-03-11 NOTE — Progress Notes (Signed)
Physical Therapy Treatment Patient Details Name: Lynn [name] MRN: [id number] DOB: [birth year] Today's Date: 03/11/2018    History of Present Illness L TKA    PT Comments    Pt has met PT goals and is ready to DC home from PT standpoint. Stair training completed, she demonstrates good understanding of HEP. Pt ambulated 250' with RW independently.   Follow Up Recommendations  Follow surgeon's recommendation for DC plan and follow-up therapies;Home health PT     Equipment Recommendations  Rolling walker with 5" wheels;3in1 (PT)    Recommendations for Other Services       Precautions / Restrictions Precautions Precautions: Fall Precaution Comments: 1 fall in past 1 year, occurred in November 2018 on some steps Restrictions Weight Bearing Restrictions: No Other Position/Activity Restrictions: WBAT    Mobility  Bed Mobility Overal bed mobility: Modified Independent             General bed mobility comments: supine to sit, self assisted LLE with RLE  Transfers Overall transfer level: Modified independent Equipment used: Rolling walker (2 wheeled) Transfers: Sit to/from Stand Sit to Stand: Modified independent (Device/Increase time)         General transfer comment: good hand placement  Ambulation/Gait   Gait Distance (Feet): 250 Feet Assistive device: Rolling walker (2 wheeled) Gait Pattern/deviations: Step-through pattern;Decreased step length - right;Decreased step length - left Gait velocity: decr   General Gait Details: steady, no LOB   Stairs Stairs: Yes Stairs assistance: Supervision Stair Management: One rail Left;Forwards;With walker Number of Stairs: 5 General stair comments: supervision, good sequencing, no loss of balance   Wheelchair Mobility    Modified Rankin (Stroke Patients Only)       Balance Overall balance assessment: Modified Independent                                          Cognition  Arousal/Alertness: Awake/alert Behavior During Therapy: WFL for tasks assessed/performed Overall Cognitive Status: Within Functional Limits for tasks assessed                                        Exercises Total Joint Exercises Ankle Circles/Pumps: AROM;10 reps;Both;Supine Quad Sets: AROM;Left;5 reps;Supine Short Arc Quad: AROM;Left;10 reps;Supine;AAROM Heel Slides: AAROM;Left;10 reps;Supine Hip ABduction/ADduction: AAROM;Left;10 reps;Supine Straight Leg Raises: AROM;Left;10 reps;Supine;AAROM Long Arc Quad: Left;AAROM;10 reps;Seated Knee Flexion: AAROM;AROM;Left;10 reps;Seated Goniometric ROM: 5-50* AAROM L knee    General Comments        Pertinent Vitals/Pain Pain Score: 4  Pain Location: L knee with movement Pain Descriptors / Indicators: Sore Pain Intervention(s): Limited activity within patient's tolerance;Monitored during session;Ice applied;Premedicated before session    Home Living                      Prior Function            PT Goals (current goals can now be found in the care plan section) Acute Rehab PT Goals Patient Stated Goal: work in the yard PT Goal Formulation: With patient Time For Goal Achievement: 03/17/18 Potential to Achieve Goals: Good Progress towards PT goals: Goals met/education completed, patient discharged from PT    Frequency    7X/week      PT Plan Current plan remains appropriate    Co-evaluation  AM-PAC PT "6 Clicks" Daily Activity  Outcome Measure  Difficulty turning over in bed (including adjusting bedclothes, sheets and blankets)?: None Difficulty moving from lying on back to sitting on the side of the bed? : None Difficulty sitting down on and standing up from a chair with arms (e.g., wheelchair, bedside commode, etc,.)?: A Little Help needed moving to and from a bed to chair (including a wheelchair)?: None Help needed walking in hospital room?: None Help needed climbing 3-5  steps with a railing? : A Little 6 Click Score: 22    End of Session Equipment Utilized During Treatment: Gait belt Activity Tolerance: Patient limited by pain;Patient tolerated treatment well Patient left: in bed;with call bell/phone within reach Nurse Communication: Mobility status PT Visit Diagnosis: Difficulty in walking, not elsewhere classified (R26.2);Pain;Muscle weakness (generalized) (M62.81) Pain - Right/Left: Left Pain - part of body: Knee     Time: 9022-8406 PT Time Calculation (min) (ACUTE ONLY): 25 min  Charges:  $Gait Training: 8-22 mins $Therapeutic Exercise: 8-22 mins                    G Codes:          Philomena Doheny 03/11/2018, 10:37 AM [phone]
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
## 2018-03-11 NOTE — Progress Notes (Signed)
PATIENT ID: Lynn [name]  MRN: [id number]  DOB/AGE:  [birth year] / 67 y.o.  2 Days Post-Op Procedure(s) (LRB): LEFT TOTAL KNEE ARTHROPLASTY (Left)    PROGRESS NOTE Subjective: Patient is alert, oriented, no Nausea, no Vomiting, yes passing gas. Taking PO well. Denies SOB, Chest or Calf Pain. Using Incentive Spirometer, PAS in place. Ambulate WBAT with pt walking 160 ft with therapy, Patient reports pain as moderate .    Objective: Vital signs in last 24 hours: Vitals:   03/10/18 0905 03/10/18 1406 03/10/18 2056 03/11/18 0535  BP: (!) 146/86 106/65 115/62 (!) 144/77  Pulse: 91 73 91 97  Resp: 14 14 16    Temp: 98.6 F (37 C) 98.6 F (37 C) 99.3 F (37.4 C) 98.6 F (37 C)  TempSrc: Oral Oral Oral Oral  SpO2: 94% 94% 96% 95%  Weight:      Height:          Intake/Output from previous day: I/O last 3 completed shifts: In: 4098 [P.O.:1480; I.V.:1850] Out: 600 [Urine:600]   Intake/Output this shift: No intake/output data recorded.   LABORATORY DATA: Recent Labs    03/10/18 0530 03/11/18 0545  WBC 15.9* 14.7*  HGB 12.2 11.3*  HCT 37.0 34.6*  PLT 209 201  NA 140  --   K 4.6  --   CL 105  --   CO2 31  --   BUN 14  --   CREATININE 0.70  --   GLUCOSE 182*  --   CALCIUM 8.6*  --     Examination: Neurologically intact Neurovascular intact Sensation intact distally Intact pulses distally Dorsiflexion/Plantar flexion intact Incision: dressing C/D/I and no drainage No cellulitis present Compartment soft}  Assessment:   2 Days Post-Op Procedure(s) (LRB): LEFT TOTAL KNEE ARTHROPLASTY (Left) ADDITIONAL DIAGNOSIS: Expected Acute Blood Loss Anemia, Hypertension, Obesity, depression, posttraumatic stress disorder Anticipated LOS equal to or greater than 2 midnights due to - Age 1 and older with one or more of the following:  - Obesity  - Expected need for hospital services (PT, OT, Nursing) required for safe  discharge  - Anticipated need for postoperative skilled  nursing care or inpatient rehab  - Active co-morbidities: Mental health issues including depression and posttraumatic stress disorder    Plan: PT/OT WBAT, AROM and PROM  DVT Prophylaxis:  [id number], Eliquis 2.5 mg BID x 2 weeks DISCHARGE PLAN: Home DISCHARGE NEEDS: HHPT, Walker and 3-in-1 comode seat     Joanell Rising 03/11/2018, 7:44 AM

## 2018-03-12 DIAGNOSIS — Z87891 Personal history of nicotine dependence: Secondary | ICD-10-CM | POA: Diagnosis not present

## 2018-03-12 DIAGNOSIS — Z87828 Personal history of other (healed) physical injury and trauma: Secondary | ICD-10-CM | POA: Diagnosis not present

## 2018-03-12 DIAGNOSIS — G4733 Obstructive sleep apnea (adult) (pediatric): Secondary | ICD-10-CM | POA: Diagnosis not present

## 2018-03-12 DIAGNOSIS — Z8781 Personal history of (healed) traumatic fracture: Secondary | ICD-10-CM | POA: Diagnosis not present

## 2018-03-12 DIAGNOSIS — Z96652 Presence of left artificial knee joint: Secondary | ICD-10-CM | POA: Diagnosis not present

## 2018-03-12 DIAGNOSIS — Z9081 Acquired absence of spleen: Secondary | ICD-10-CM | POA: Diagnosis not present

## 2018-03-12 DIAGNOSIS — I739 Peripheral vascular disease, unspecified: Secondary | ICD-10-CM | POA: Diagnosis not present

## 2018-03-12 DIAGNOSIS — Z981 Arthrodesis status: Secondary | ICD-10-CM | POA: Diagnosis not present

## 2018-03-12 DIAGNOSIS — Z7901 Long term (current) use of anticoagulants: Secondary | ICD-10-CM | POA: Diagnosis not present

## 2018-03-12 DIAGNOSIS — Z79891 Long term (current) use of opiate analgesic: Secondary | ICD-10-CM | POA: Diagnosis not present

## 2018-03-12 DIAGNOSIS — E669 Obesity, unspecified: Secondary | ICD-10-CM | POA: Diagnosis not present

## 2018-03-12 DIAGNOSIS — E039 Hypothyroidism, unspecified: Secondary | ICD-10-CM | POA: Diagnosis not present

## 2018-03-12 DIAGNOSIS — E785 Hyperlipidemia, unspecified: Secondary | ICD-10-CM | POA: Diagnosis not present

## 2018-03-12 DIAGNOSIS — F329 Major depressive disorder, single episode, unspecified: Secondary | ICD-10-CM | POA: Diagnosis not present

## 2018-03-12 DIAGNOSIS — Z471 Aftercare following joint replacement surgery: Secondary | ICD-10-CM | POA: Diagnosis not present

## 2018-03-12 DIAGNOSIS — K219 Gastro-esophageal reflux disease without esophagitis: Secondary | ICD-10-CM | POA: Diagnosis not present

## 2018-03-12 DIAGNOSIS — F4310 Post-traumatic stress disorder, unspecified: Secondary | ICD-10-CM | POA: Diagnosis not present

## 2018-03-12 DIAGNOSIS — E559 Vitamin D deficiency, unspecified: Secondary | ICD-10-CM | POA: Diagnosis not present

## 2018-03-12 DIAGNOSIS — H409 Unspecified glaucoma: Secondary | ICD-10-CM | POA: Diagnosis not present

## 2018-03-12 DIAGNOSIS — F410 Panic disorder [episodic paroxysmal anxiety] without agoraphobia: Secondary | ICD-10-CM | POA: Diagnosis not present

## 2018-03-14 DIAGNOSIS — Z471 Aftercare following joint replacement surgery: Secondary | ICD-10-CM | POA: Diagnosis not present

## 2018-03-14 DIAGNOSIS — F329 Major depressive disorder, single episode, unspecified: Secondary | ICD-10-CM | POA: Diagnosis not present

## 2018-03-14 DIAGNOSIS — G4733 Obstructive sleep apnea (adult) (pediatric): Secondary | ICD-10-CM | POA: Diagnosis not present

## 2018-03-14 DIAGNOSIS — I739 Peripheral vascular disease, unspecified: Secondary | ICD-10-CM | POA: Diagnosis not present

## 2018-03-14 DIAGNOSIS — F410 Panic disorder [episodic paroxysmal anxiety] without agoraphobia: Secondary | ICD-10-CM | POA: Diagnosis not present

## 2018-03-14 DIAGNOSIS — F4310 Post-traumatic stress disorder, unspecified: Secondary | ICD-10-CM | POA: Diagnosis not present

## 2018-03-16 DIAGNOSIS — F329 Major depressive disorder, single episode, unspecified: Secondary | ICD-10-CM | POA: Diagnosis not present

## 2018-03-16 DIAGNOSIS — F410 Panic disorder [episodic paroxysmal anxiety] without agoraphobia: Secondary | ICD-10-CM | POA: Diagnosis not present

## 2018-03-16 DIAGNOSIS — Z471 Aftercare following joint replacement surgery: Secondary | ICD-10-CM | POA: Diagnosis not present

## 2018-03-16 DIAGNOSIS — I739 Peripheral vascular disease, unspecified: Secondary | ICD-10-CM | POA: Diagnosis not present

## 2018-03-16 DIAGNOSIS — F4310 Post-traumatic stress disorder, unspecified: Secondary | ICD-10-CM | POA: Diagnosis not present

## 2018-03-16 DIAGNOSIS — G4733 Obstructive sleep apnea (adult) (pediatric): Secondary | ICD-10-CM | POA: Diagnosis not present

## 2018-03-18 DIAGNOSIS — I739 Peripheral vascular disease, unspecified: Secondary | ICD-10-CM | POA: Diagnosis not present

## 2018-03-18 DIAGNOSIS — F329 Major depressive disorder, single episode, unspecified: Secondary | ICD-10-CM | POA: Diagnosis not present

## 2018-03-18 DIAGNOSIS — G4733 Obstructive sleep apnea (adult) (pediatric): Secondary | ICD-10-CM | POA: Diagnosis not present

## 2018-03-18 DIAGNOSIS — F4310 Post-traumatic stress disorder, unspecified: Secondary | ICD-10-CM | POA: Diagnosis not present

## 2018-03-18 DIAGNOSIS — Z471 Aftercare following joint replacement surgery: Secondary | ICD-10-CM | POA: Diagnosis not present

## 2018-03-18 DIAGNOSIS — F410 Panic disorder [episodic paroxysmal anxiety] without agoraphobia: Secondary | ICD-10-CM | POA: Diagnosis not present

## 2018-03-20 DIAGNOSIS — F4310 Post-traumatic stress disorder, unspecified: Secondary | ICD-10-CM | POA: Diagnosis not present

## 2018-03-20 DIAGNOSIS — I739 Peripheral vascular disease, unspecified: Secondary | ICD-10-CM | POA: Diagnosis not present

## 2018-03-20 DIAGNOSIS — G4733 Obstructive sleep apnea (adult) (pediatric): Secondary | ICD-10-CM | POA: Diagnosis not present

## 2018-03-20 DIAGNOSIS — F410 Panic disorder [episodic paroxysmal anxiety] without agoraphobia: Secondary | ICD-10-CM | POA: Diagnosis not present

## 2018-03-20 DIAGNOSIS — Z471 Aftercare following joint replacement surgery: Secondary | ICD-10-CM | POA: Diagnosis not present

## 2018-03-20 DIAGNOSIS — F329 Major depressive disorder, single episode, unspecified: Secondary | ICD-10-CM | POA: Diagnosis not present

## 2018-03-23 DIAGNOSIS — F4310 Post-traumatic stress disorder, unspecified: Secondary | ICD-10-CM | POA: Diagnosis not present

## 2018-03-23 DIAGNOSIS — Z471 Aftercare following joint replacement surgery: Secondary | ICD-10-CM | POA: Diagnosis not present

## 2018-03-23 DIAGNOSIS — F410 Panic disorder [episodic paroxysmal anxiety] without agoraphobia: Secondary | ICD-10-CM | POA: Diagnosis not present

## 2018-03-23 DIAGNOSIS — F329 Major depressive disorder, single episode, unspecified: Secondary | ICD-10-CM | POA: Diagnosis not present

## 2018-03-23 DIAGNOSIS — I739 Peripheral vascular disease, unspecified: Secondary | ICD-10-CM | POA: Diagnosis not present

## 2018-03-23 DIAGNOSIS — G4733 Obstructive sleep apnea (adult) (pediatric): Secondary | ICD-10-CM | POA: Diagnosis not present

## 2018-03-24 DIAGNOSIS — M1732 Unilateral post-traumatic osteoarthritis, left knee: Secondary | ICD-10-CM | POA: Diagnosis not present

## 2018-03-25 ENCOUNTER — Encounter (HOSPITAL_COMMUNITY): Payer: Self-pay

## 2018-03-25 ENCOUNTER — Ambulatory Visit (HOSPITAL_COMMUNITY): Payer: Medicare Other

## 2018-03-25 ENCOUNTER — Telehealth (HOSPITAL_COMMUNITY): Payer: Self-pay | Admitting: Physician Assistant

## 2018-03-25 NOTE — Telephone Encounter (Signed)
03/25/18  daughter called to cx because her mom has been up thru the night with an upset stomach... RS for 7/17

## 2018-04-01 ENCOUNTER — Encounter (HOSPITAL_COMMUNITY): Payer: Self-pay | Admitting: Physical Therapy

## 2018-04-01 ENCOUNTER — Other Ambulatory Visit: Payer: Self-pay

## 2018-04-01 ENCOUNTER — Ambulatory Visit (HOSPITAL_COMMUNITY): Payer: Medicare Other | Attending: Orthopedic Surgery | Admitting: Physical Therapy

## 2018-04-01 DIAGNOSIS — M6281 Muscle weakness (generalized): Secondary | ICD-10-CM

## 2018-04-01 DIAGNOSIS — R2689 Other abnormalities of gait and mobility: Secondary | ICD-10-CM | POA: Diagnosis not present

## 2018-04-01 DIAGNOSIS — M25662 Stiffness of left knee, not elsewhere classified: Secondary | ICD-10-CM | POA: Diagnosis not present

## 2018-04-01 DIAGNOSIS — R29898 Other symptoms and signs involving the musculoskeletal system: Secondary | ICD-10-CM | POA: Diagnosis not present
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
## 2018-04-03 ENCOUNTER — Encounter (HOSPITAL_COMMUNITY): Payer: Self-pay

## 2018-04-03 ENCOUNTER — Ambulatory Visit (HOSPITAL_COMMUNITY): Payer: Medicare Other

## 2018-04-03 DIAGNOSIS — R29898 Other symptoms and signs involving the musculoskeletal system: Secondary | ICD-10-CM | POA: Diagnosis not present

## 2018-04-03 DIAGNOSIS — M25662 Stiffness of left knee, not elsewhere classified: Secondary | ICD-10-CM | POA: Diagnosis not present

## 2018-04-03 DIAGNOSIS — R2689 Other abnormalities of gait and mobility: Secondary | ICD-10-CM | POA: Diagnosis not present

## 2018-04-03 DIAGNOSIS — M6281 Muscle weakness (generalized): Secondary | ICD-10-CM | POA: Diagnosis not present
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
## 2018-04-06 ENCOUNTER — Ambulatory Visit (HOSPITAL_COMMUNITY): Payer: Medicare Other | Admitting: Physical Therapy

## 2018-04-06 ENCOUNTER — Other Ambulatory Visit: Payer: Self-pay | Admitting: Physician Assistant

## 2018-04-06 DIAGNOSIS — M6281 Muscle weakness (generalized): Secondary | ICD-10-CM

## 2018-04-06 DIAGNOSIS — R29898 Other symptoms and signs involving the musculoskeletal system: Secondary | ICD-10-CM | POA: Diagnosis not present

## 2018-04-06 DIAGNOSIS — M25662 Stiffness of left knee, not elsewhere classified: Secondary | ICD-10-CM

## 2018-04-06 DIAGNOSIS — R2689 Other abnormalities of gait and mobility: Secondary | ICD-10-CM | POA: Diagnosis not present

## 2018-04-06 DIAGNOSIS — F411 Generalized anxiety disorder: Secondary | ICD-10-CM
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
## 2018-04-08 ENCOUNTER — Ambulatory Visit (HOSPITAL_COMMUNITY): Payer: Medicare Other | Admitting: Physical Therapy

## 2018-04-08 ENCOUNTER — Encounter (HOSPITAL_COMMUNITY): Payer: Self-pay | Admitting: Physical Therapy

## 2018-04-08 DIAGNOSIS — M25662 Stiffness of left knee, not elsewhere classified: Secondary | ICD-10-CM | POA: Diagnosis not present

## 2018-04-08 DIAGNOSIS — M6281 Muscle weakness (generalized): Secondary | ICD-10-CM

## 2018-04-08 DIAGNOSIS — R2689 Other abnormalities of gait and mobility: Secondary | ICD-10-CM

## 2018-04-08 DIAGNOSIS — R29898 Other symptoms and signs involving the musculoskeletal system: Secondary | ICD-10-CM
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
## 2018-04-10 ENCOUNTER — Ambulatory Visit (HOSPITAL_COMMUNITY): Payer: Medicare Other

## 2018-04-10 DIAGNOSIS — M6281 Muscle weakness (generalized): Secondary | ICD-10-CM | POA: Diagnosis not present

## 2018-04-10 DIAGNOSIS — R2689 Other abnormalities of gait and mobility: Secondary | ICD-10-CM | POA: Diagnosis not present

## 2018-04-10 DIAGNOSIS — M25662 Stiffness of left knee, not elsewhere classified: Secondary | ICD-10-CM | POA: Diagnosis not present

## 2018-04-10 DIAGNOSIS — R29898 Other symptoms and signs involving the musculoskeletal system: Secondary | ICD-10-CM | POA: Diagnosis not present
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
## 2018-04-13 ENCOUNTER — Ambulatory Visit (HOSPITAL_COMMUNITY): Payer: Medicare Other | Admitting: Physical Therapy

## 2018-04-15 ENCOUNTER — Ambulatory Visit (HOSPITAL_COMMUNITY): Payer: Medicare Other | Admitting: Physical Therapy

## 2018-04-15 ENCOUNTER — Encounter (HOSPITAL_COMMUNITY): Payer: Self-pay | Admitting: Physical Therapy

## 2018-04-15 DIAGNOSIS — R2689 Other abnormalities of gait and mobility: Secondary | ICD-10-CM | POA: Diagnosis not present

## 2018-04-15 DIAGNOSIS — M25662 Stiffness of left knee, not elsewhere classified: Secondary | ICD-10-CM | POA: Diagnosis not present

## 2018-04-15 DIAGNOSIS — R29898 Other symptoms and signs involving the musculoskeletal system: Secondary | ICD-10-CM

## 2018-04-15 DIAGNOSIS — M6281 Muscle weakness (generalized): Secondary | ICD-10-CM | POA: Diagnosis not present
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
## 2018-04-17 ENCOUNTER — Ambulatory Visit (HOSPITAL_COMMUNITY): Payer: Medicare Other | Attending: Orthopedic Surgery | Admitting: Physical Therapy

## 2018-04-17 ENCOUNTER — Encounter (HOSPITAL_COMMUNITY): Payer: Self-pay | Admitting: Physical Therapy

## 2018-04-17 DIAGNOSIS — R2689 Other abnormalities of gait and mobility: Secondary | ICD-10-CM | POA: Diagnosis not present

## 2018-04-17 DIAGNOSIS — M6281 Muscle weakness (generalized): Secondary | ICD-10-CM | POA: Diagnosis not present

## 2018-04-17 DIAGNOSIS — M25662 Stiffness of left knee, not elsewhere classified: Secondary | ICD-10-CM | POA: Insufficient documentation

## 2018-04-17 DIAGNOSIS — R29898 Other symptoms and signs involving the musculoskeletal system: Secondary | ICD-10-CM | POA: Insufficient documentation
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
## 2018-04-20 ENCOUNTER — Ambulatory Visit (HOSPITAL_COMMUNITY): Payer: Medicare Other

## 2018-04-20 ENCOUNTER — Encounter (HOSPITAL_COMMUNITY): Payer: Self-pay

## 2018-04-20 DIAGNOSIS — R29898 Other symptoms and signs involving the musculoskeletal system: Secondary | ICD-10-CM | POA: Diagnosis not present

## 2018-04-20 DIAGNOSIS — M6281 Muscle weakness (generalized): Secondary | ICD-10-CM | POA: Diagnosis not present

## 2018-04-20 DIAGNOSIS — M25662 Stiffness of left knee, not elsewhere classified: Secondary | ICD-10-CM | POA: Diagnosis not present

## 2018-04-20 DIAGNOSIS — R2689 Other abnormalities of gait and mobility: Secondary | ICD-10-CM | POA: Diagnosis not present
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
## 2018-04-22 ENCOUNTER — Telehealth (HOSPITAL_COMMUNITY): Payer: Self-pay | Admitting: Physical Therapy

## 2018-04-22 ENCOUNTER — Ambulatory Visit (HOSPITAL_COMMUNITY): Payer: Medicare Other | Admitting: Physical Therapy

## 2018-04-22 NOTE — Telephone Encounter (Signed)
Called regarding patient not showing up for appointment at 8:15 this morning. There was no answer and phone continued to ring, therefore patient was unable to leave a phone message.   Clarene Critchley PT, DPT 8:59 AM, 04/22/18 [phone]

## 2018-04-24 ENCOUNTER — Telehealth (HOSPITAL_COMMUNITY): Payer: Self-pay | Admitting: Physical Therapy

## 2018-04-24 ENCOUNTER — Ambulatory Visit (HOSPITAL_COMMUNITY): Payer: Medicare Other | Admitting: Physical Therapy

## 2018-04-24 NOTE — Telephone Encounter (Signed)
Attempted to call pt re: nsecond no show but there was no answer.   Rayetta Humphrey, [place] CLT [phone]

## 2018-04-27 ENCOUNTER — Ambulatory Visit (HOSPITAL_COMMUNITY): Payer: Medicare Other

## 2018-04-27 ENCOUNTER — Telehealth (HOSPITAL_COMMUNITY): Payer: Self-pay

## 2018-04-27 ENCOUNTER — Ambulatory Visit (INDEPENDENT_AMBULATORY_CARE_PROVIDER_SITE_OTHER): Payer: Medicare Other | Admitting: Physician Assistant

## 2018-04-27 ENCOUNTER — Encounter: Payer: Self-pay | Admitting: Physician Assistant

## 2018-04-27 ENCOUNTER — Encounter (HOSPITAL_COMMUNITY): Payer: Self-pay

## 2018-04-27 ENCOUNTER — Other Ambulatory Visit: Payer: Self-pay

## 2018-04-27 VITALS — BP 116/84 | HR 76 | Temp 98.3°F | Resp 16 | Ht 61.0 in | Wt 183.8 lb

## 2018-04-27 DIAGNOSIS — M1712 Unilateral primary osteoarthritis, left knee: Secondary | ICD-10-CM

## 2018-04-27 DIAGNOSIS — I10 Essential (primary) hypertension: Secondary | ICD-10-CM | POA: Diagnosis not present

## 2018-04-27 DIAGNOSIS — R739 Hyperglycemia, unspecified: Secondary | ICD-10-CM

## 2018-04-27 DIAGNOSIS — F411 Generalized anxiety disorder: Secondary | ICD-10-CM

## 2018-04-27 DIAGNOSIS — E785 Hyperlipidemia, unspecified: Secondary | ICD-10-CM | POA: Diagnosis not present

## 2018-04-27 DIAGNOSIS — F410 Panic disorder [episodic paroxysmal anxiety] without agoraphobia: Secondary | ICD-10-CM

## 2018-04-27 DIAGNOSIS — E559 Vitamin D deficiency, unspecified: Secondary | ICD-10-CM | POA: Diagnosis not present
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
## 2018-04-27 NOTE — Telephone Encounter (Signed)
No Show # 3: I called Ms. Fomby at her mobile phone number and was left a message as she did not answer. I informed her that this was her 3rd no-show and per our attendance policy we would be discharging her from therapy and cancelling all future appointments. I provided our front office number for her to call if she has any questions regarding her discharge.   Kipp Brood, PT, DPT Physical Therapist with [facility]  04/27/2018 11:43 AM

## 2018-04-27 NOTE — Therapy (Signed)
[facility] [place], [place], [address] Phone: [phone]   Fax:  [phone]  Patient Details  Name: Lynn [name] MRN: [id number] Date of Birth: Nov 17, 1950 Referring Provider:  No ref. provider found  Encounter Date: 04/27/2018   PHYSICAL THERAPY DISCHARGE SUMMARY  Visits from Start of Care: 8  Current functional level related to goals / functional outcomes: Ms. Azam had her 3rd no-show today and I attempted to contact her at her mobile phone number. She did not answer and I left a message. I informed her that this was her 3rd no-show and per our attendance policy we would be discharging her from therapy and cancelling all future appointments. I provided our front office number for her to call if she has any questions regarding her discharge.    Remaining deficits: Re-assessment performed on 04/20/18:     Transfers   Five time sit to stand comments   9.99 secs    Ambulation/Gait    Ambulation/Gait  Yes    Ambulation/Gait Assistance  5: Supervision    Ambulation Distance (Feet)  225 Feet    Assistive device  None    Gait velocity  0.46 m/s    Knee/Hip Exercises: Supine    Knee Extension  AROM    Knee Extension Limitations  1    Knee Flexion  AROM    Knee Flexion Limitations  95    Goals status on 04/20/18: PT Short Term Goals - 04/20/18 0907            PT SHORT TERM GOAL #1   Title  Patient will demonstrate understanding and report regular compliance with HEP in order to return to prior level of function.     Baseline  performing HEP 2-3x/day    Time  3    Period  Weeks    Status  Achieved    Target Date  04/22/18        PT SHORT TERM GOAL #2   Title  Patient will demonstrate left knee extension/flexion active range of motion of at least 3-95 degrees to assist with more normalized gait pattern and stair ambulation.    Baseline  1-95 post ther ex and manual therapy    Time  3     Period  Weeks    Status  Partially Met        PT SHORT TERM GOAL #3   Title  Patient will demonstrate improvement of 1/2 MMT grade in strength of all deficient muscle groups except for left dorsiflexion, in order to assist patient with improved ambulation and stair negotiation.     Time  3    Period  Weeks    Status  On-going        PT SHORT TERM GOAL #4   Title  Patient will demonstrate ability to perform single limb stance for 3 seconds or greater indicating improved balance and safety.     Time  3    Period  Weeks    Status  On-going           PT Long Term Goals - 04/20/18 0956            PT LONG TERM GOAL #1   Title  Patient will improve ROM for left knee extension/flexion to 0-120 degrees to improve ease of stair ambulation, squatting and other functional mobility.    Baseline  1-95    Time  6    Period  Weeks  Status  On-going    Target Date  05/13/18        PT LONG TERM GOAL #2   Title  Patient will demonstrate improvement of 1 MMT grade in strength of all deficient muscle groups except for left dorsiflexion, in order to assist patient with improved ambulation and stair negotiation.     Time  6    Period  Weeks    Status  On-going        PT LONG TERM GOAL #3   Title  Patient will demonstrate ability to perform the 5xSTS test in 14 seconds or less indicating improved balance and functional mobility.     Time  6    Period  Weeks    Status  Achieved        PT LONG TERM GOAL #4   Title  Patient will demonstrate ability to ambulate at a gait velocity of at least 1.2 m/s on the 3 MWT with the least restrictive assistive device in order to have improved safety and mobility at home and in the community.     Time  6    Period  Weeks    Status  Product/process development scientist / Equipment: Educated on Materials engineer and on attendance policy and HEP throughout therapy. Plan: Patient agrees to discharge.  Patient goals were  partially met. Patient is being discharged due to                                                     ?????3 no-shows consecutively with multiple attempts to contact patient and no response.     Kipp Brood, PT, DPT Physical Therapist with [facility]  04/27/2018 11:44 AM    [facility] [place], [place], [address] Phone: [phone]   Fax:  [phone]

## 2018-04-28 LAB — COMPLETE METABOLIC PANEL WITH GFR
AG Ratio: 1.4 (calc) (ref 1.0–2.5)
ALBUMIN MSPROF: 4.2 g/dL (ref 3.6–5.1)
ALKALINE PHOSPHATASE (APISO): 97 U/L (ref 33–130)
ALT: 6 U/L (ref 6–29)
AST: 15 U/L (ref 10–35)
BILIRUBIN TOTAL: 0.4 mg/dL (ref 0.2–1.2)
BUN: 20 mg/dL (ref 7–25)
CHLORIDE: 104 mmol/L (ref 98–110)
CO2: 29 mmol/L (ref 20–32)
Calcium: 9.5 mg/dL (ref 8.6–10.4)
Creat: 0.72 mg/dL (ref 0.50–0.99)
GFR, Est African American: 103 mL/min/{1.73_m2} (ref >=60)
GFR, Est Non African American: 88 mL/min/{1.73_m2} (ref >=60)
GLUCOSE: 116 mg/dL — AB (ref 65–99)
Globulin: 3.1 g/dL (calc) (ref 1.9–3.7)
Potassium: 4.3 mmol/L (ref 3.5–5.3)
Sodium: 141 mmol/L (ref 135–146)
Total Protein: 7.3 g/dL (ref 6.1–8.1)

## 2018-04-28 LAB — LIPID PANEL
CHOLESTEROL: 164 mg/dL (ref <200)
HDL: 47 mg/dL — AB (ref >50)
LDL CHOLESTEROL (CALC): 97 mg/dL
Non-HDL Cholesterol (Calc): 117 mg/dL (calc) (ref <130)
Total CHOL/HDL Ratio: 3.5 (calc) (ref <5.0)
Triglycerides: 104 mg/dL (ref <150)

## 2018-04-28 LAB — HEMOGLOBIN A1C
EAG (MMOL/L): 5.8 (calc)
Hgb A1c MFr Bld: 5.3 % of total Hgb (ref <5.7)
MEAN PLASMA GLUCOSE: 105 (calc)

## 2018-04-29 ENCOUNTER — Encounter (HOSPITAL_COMMUNITY): Payer: Medicare Other | Admitting: Physical Therapy

## 2018-05-01 ENCOUNTER — Encounter (HOSPITAL_COMMUNITY): Payer: Medicare Other | Admitting: Physical Therapy

## 2018-05-05 ENCOUNTER — Other Ambulatory Visit: Payer: Self-pay | Admitting: Physician Assistant

## 2018-05-05 DIAGNOSIS — Z96652 Presence of left artificial knee joint: Secondary | ICD-10-CM | POA: Diagnosis not present

## 2018-05-05 NOTE — Telephone Encounter (Signed)
Ok to refill??  Last office visit 04/27/2018.  Last refill 5//28/2019, #2 refills.

## 2018-05-06 NOTE — Telephone Encounter (Signed)
My app currently is not working to send Rx electronically.  Please CALL IN. Call in refill for #60+2

## 2018-05-06 NOTE — Telephone Encounter (Signed)
RX called into pharmacy

## 2018-05-07 ENCOUNTER — Encounter: Payer: Self-pay | Admitting: Physician Assistant

## 2018-05-07 DIAGNOSIS — Z803 Family history of malignant neoplasm of breast: Secondary | ICD-10-CM | POA: Diagnosis not present

## 2018-05-07 DIAGNOSIS — Z1231 Encounter for screening mammogram for malignant neoplasm of breast: Secondary | ICD-10-CM | POA: Diagnosis not present

## 2018-05-07 LAB — HM MAMMOGRAPHY

## 2018-05-30 ENCOUNTER — Other Ambulatory Visit: Payer: Self-pay | Admitting: Physician Assistant

## 2018-06-19 ENCOUNTER — Other Ambulatory Visit: Payer: Self-pay | Admitting: Physician Assistant

## 2018-08-03 ENCOUNTER — Other Ambulatory Visit: Payer: Self-pay | Admitting: Physician Assistant

## 2018-08-04 ENCOUNTER — Other Ambulatory Visit: Payer: Self-pay | Admitting: Physician Assistant

## 2018-08-04 NOTE — Telephone Encounter (Signed)
Ok to refill??  Last office visit 04/27/2018.  Last refill 05/06/2018, #2 refills.

## 2018-08-04 NOTE — Telephone Encounter (Signed)
Needs OV.  

## 2018-08-05 NOTE — Telephone Encounter (Signed)
Call placed to patient and patient made aware.   Appointment scheduled.   PCP changed to MD.

## 2018-08-18 ENCOUNTER — Other Ambulatory Visit: Payer: Self-pay

## 2018-08-18 ENCOUNTER — Ambulatory Visit (INDEPENDENT_AMBULATORY_CARE_PROVIDER_SITE_OTHER): Payer: Medicare Other | Admitting: Family Medicine

## 2018-08-18 ENCOUNTER — Encounter: Payer: Self-pay | Admitting: Family Medicine

## 2018-08-18 VITALS — BP 118/64 | HR 60 | Temp 98.3°F | Resp 18 | Ht 61.0 in | Wt 186.0 lb

## 2018-08-18 DIAGNOSIS — F411 Generalized anxiety disorder: Secondary | ICD-10-CM

## 2018-08-18 DIAGNOSIS — Z23 Encounter for immunization: Secondary | ICD-10-CM

## 2018-08-18 DIAGNOSIS — I10 Essential (primary) hypertension: Secondary | ICD-10-CM

## 2018-08-18 DIAGNOSIS — F320 Major depressive disorder, single episode, mild: Secondary | ICD-10-CM

## 2018-08-18 DIAGNOSIS — R1013 Epigastric pain: Secondary | ICD-10-CM

## 2018-08-18 DIAGNOSIS — K219 Gastro-esophageal reflux disease without esophagitis: Secondary | ICD-10-CM

## 2018-08-18 DIAGNOSIS — R011 Cardiac murmur, unspecified: Secondary | ICD-10-CM | POA: Diagnosis not present

## 2018-08-18 DIAGNOSIS — R110 Nausea: Secondary | ICD-10-CM | POA: Diagnosis not present
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
## 2018-08-18 NOTE — Patient Instructions (Addendum)
ECHO TO BE DONE to check your heart We will call with lab results Try the dexilant Flu shot given  F/U 4 months

## 2018-08-19 ENCOUNTER — Encounter: Payer: Self-pay | Admitting: Family Medicine

## 2018-08-19 LAB — COMPREHENSIVE METABOLIC PANEL
AG RATIO: 1.5 (calc) (ref 1.0–2.5)
ALT: 9 U/L (ref 6–29)
AST: 17 U/L (ref 10–35)
Albumin: 4.3 g/dL (ref 3.6–5.1)
Alkaline phosphatase (APISO): 97 U/L (ref 33–130)
BUN: 14 mg/dL (ref 7–25)
CALCIUM: 9.6 mg/dL (ref 8.6–10.4)
CO2: 29 mmol/L (ref 20–32)
Chloride: 104 mmol/L (ref 98–110)
Creat: 0.74 mg/dL (ref 0.50–0.99)
GLUCOSE: 88 mg/dL (ref 65–99)
Globulin: 2.9 g/dL (calc) (ref 1.9–3.7)
Potassium: 4.4 mmol/L (ref 3.5–5.3)
Sodium: 142 mmol/L (ref 135–146)
Total Bilirubin: 0.6 mg/dL (ref 0.2–1.2)
Total Protein: 7.2 g/dL (ref 6.1–8.1)

## 2018-08-19 LAB — LIPASE: Lipase: 13 U/L (ref 7–60)

## 2018-08-19 NOTE — Assessment & Plan Note (Addendum)
Doing fair on paxil and xanax, no changes

## 2018-08-19 NOTE — Assessment & Plan Note (Signed)
Controlled no changes 

## 2018-08-19 NOTE — Assessment & Plan Note (Signed)
Chronic issues with depression and PTSD, she feels she is doing okay, though score fairly high on PHQ-9 Does not want to see therapist again

## 2018-08-21 ENCOUNTER — Ambulatory Visit (HOSPITAL_COMMUNITY)
Admission: RE | Admit: 2018-08-21 | Discharge: 2018-08-21 | Disposition: A | Discharge status: Home / Self Care | Payer: Medicare Other | Source: Ambulatory Visit | Attending: Family Medicine | Admitting: Family Medicine

## 2018-08-21 DIAGNOSIS — I10 Essential (primary) hypertension: Secondary | ICD-10-CM | POA: Diagnosis not present

## 2018-08-21 DIAGNOSIS — R011 Cardiac murmur, unspecified: Secondary | ICD-10-CM

## 2018-08-21 NOTE — Progress Notes (Signed)
*  PRELIMINARY RESULTS* Echocardiogram 2D Echocardiogram has been performed.  Leavy Cella 08/21/2018, 9:40 AM

## 2018-08-26 ENCOUNTER — Other Ambulatory Visit: Payer: Self-pay | Admitting: *Deleted

## 2018-08-26 MED ORDER — DEXLANSOPRAZOLE 60 MG PO CPDR: 60.0000 mg | DELAYED_RELEASE_CAPSULE | Freq: Every day | ORAL | 3 refills | Status: DC

## 2018-09-04 ENCOUNTER — Other Ambulatory Visit: Payer: Self-pay | Admitting: Family Medicine

## 2018-09-04 NOTE — Telephone Encounter (Signed)
Ok to refill??  Last office visit 08/18/2018.  Last refill 08/04/2018.

## 2018-10-02 ENCOUNTER — Other Ambulatory Visit: Payer: Self-pay | Admitting: Family Medicine

## 2018-10-02 DIAGNOSIS — F411 Generalized anxiety disorder: Secondary | ICD-10-CM

## 2018-10-02 MED ORDER — PAROXETINE HCL 40 MG PO TABS: ORAL_TABLET | ORAL | 1 refills | Status: DC

## 2018-10-06 ENCOUNTER — Other Ambulatory Visit: Payer: Self-pay | Admitting: Family Medicine

## 2018-10-06 NOTE — Telephone Encounter (Signed)
Ok to refill??  Last office visit 08/18/2018.  Last refill 09/04/2018.

## 2018-10-20 ENCOUNTER — Other Ambulatory Visit: Payer: Self-pay | Admitting: Family Medicine

## 2018-10-28 ENCOUNTER — Ambulatory Visit: Payer: Medicare Other | Admitting: Physician Assistant

## 2018-12-07 ENCOUNTER — Other Ambulatory Visit: Payer: Self-pay | Admitting: *Deleted

## 2018-12-07 ENCOUNTER — Other Ambulatory Visit: Payer: Self-pay | Admitting: Family Medicine

## 2018-12-07 MED ORDER — AMLODIPINE BESYLATE 10 MG PO TABS: ORAL_TABLET | ORAL | 1 refills | Status: DC

## 2018-12-07 NOTE — Telephone Encounter (Signed)
Ok to refill??  Last office visit 08/18/2018.  Last refill 10/06/2018, #1 refills.

## 2018-12-18 ENCOUNTER — Other Ambulatory Visit: Payer: Self-pay

## 2018-12-18 ENCOUNTER — Ambulatory Visit (INDEPENDENT_AMBULATORY_CARE_PROVIDER_SITE_OTHER): Payer: Medicare Other | Admitting: Family Medicine

## 2018-12-18 DIAGNOSIS — F411 Generalized anxiety disorder: Secondary | ICD-10-CM

## 2018-12-18 DIAGNOSIS — I10 Essential (primary) hypertension: Secondary | ICD-10-CM | POA: Diagnosis not present

## 2018-12-18 DIAGNOSIS — K449 Diaphragmatic hernia without obstruction or gangrene: Secondary | ICD-10-CM | POA: Diagnosis not present

## 2018-12-18 DIAGNOSIS — K219 Gastro-esophageal reflux disease without esophagitis: Secondary | ICD-10-CM | POA: Diagnosis not present

## 2018-12-18 DIAGNOSIS — R110 Nausea: Secondary | ICD-10-CM

## 2018-12-18 MED ORDER — ONDANSETRON HCL 4 MG PO TABS: 4.0000 mg | ORAL_TABLET | Freq: Three times a day (TID) | ORAL | 0 refills | Status: DC | PRN

## 2018-12-18 NOTE — Progress Notes (Signed)
Virtual Visit via Telephone Note  I connected with Lynn [name] on 12/18/18 at  3:52  by telephone and verified that I am speaking with the correct person using two identifiers.  Pt location: at home  Physician Location: in office, [place], Vic Blackbird MD     I discussed the limitations, risks, security and privacy concerns of performing an evaluation and management service by telephone and the availability of in person appointments. I also discussed with the patient that there may be a patient responsible charge related to this service. The patient expressed understanding and agreed to proceed.    History of Present Illness: GERD, continues to have nausea but symptoms much improved with dexilant. She will have 1 bout of severe nausea with dry heave once a week now. Avoiding NSAIDS with history of ulcer and hiatal hernia. Okay with seeing GI again  GAD/MDD- using paxil and xanax, feels stress levels are fine right now, as long as she has her meds   HTN- taking norvasc , benezapril , has not had any chest pain or SOB, not taking BP         Observations/Objective:  Not examined   Assessment and Plan: GAD-  Doing well on paxil and xanax, no changes   HTN- checkl blood pressure 1-2 times a week, call if > 140/90   GERD/  Nausea- will send Zofran prn, referral to GI with PUD history, continue dexilant    Follow Up Instructions:    I discussed the assessment and treatment plan with the patient. The patient was provided an opportunity to ask questions and all were answered. The patient agreed with the plan and demonstrated an understanding of the instructions.   The patient was advised to call back or seek an in-person evaluation if the symptoms worsen or if the condition fails to improve as anticipated.  I provided  10 minutes of non-face-to-face time during this encounter. End time 4:02  Vic Blackbird, MD

## 2018-12-20 ENCOUNTER — Encounter: Payer: Self-pay | Admitting: Family Medicine

## 2019-01-04 ENCOUNTER — Other Ambulatory Visit: Payer: Self-pay | Admitting: Family Medicine

## 2019-01-07 ENCOUNTER — Other Ambulatory Visit: Payer: Self-pay | Admitting: Family Medicine

## 2019-01-08 MED ORDER — BENAZEPRIL HCL 20 MG PO TABS: ORAL_TABLET | ORAL | 1 refills | Status: DC

## 2019-01-12 ENCOUNTER — Encounter: Payer: Self-pay | Admitting: Internal Medicine

## 2019-01-12 ENCOUNTER — Ambulatory Visit (INDEPENDENT_AMBULATORY_CARE_PROVIDER_SITE_OTHER): Payer: Medicare Other | Admitting: Internal Medicine

## 2019-01-12 ENCOUNTER — Other Ambulatory Visit: Payer: Self-pay

## 2019-01-12 VITALS — Ht 61.0 in | Wt 180.0 lb

## 2019-01-12 DIAGNOSIS — K219 Gastro-esophageal reflux disease without esophagitis: Secondary | ICD-10-CM | POA: Diagnosis not present

## 2019-01-12 DIAGNOSIS — R110 Nausea: Secondary | ICD-10-CM

## 2019-01-12 MED ORDER — FAMOTIDINE 40 MG PO TABS: 40.0000 mg | ORAL_TABLET | Freq: Every day | ORAL | 3 refills | Status: DC

## 2019-01-12 NOTE — Patient Instructions (Addendum)
Good to talk to you today, I hope this medication I prescribed makes a difference.  You should be able to get it at your pharmacy soon, if there are any problems let us know.  I will enclose some information about how to eat regarding acid reflux, as we think this is what is causing the nausea.  I did not ask you about that specifically today but avoiding caffeine is helpful in this situation.  You already do not smoke and that is great and you have intentionally lost weight which is another helpful thing.  Please call us to set up a late May follow up appointment. (I was unable to reach her by phone)   I appreciate the opportunity to care for you. [name], MD, [facility]
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
## 2019-01-28 DIAGNOSIS — R51 Headache: Secondary | ICD-10-CM | POA: Diagnosis not present

## 2019-01-28 DIAGNOSIS — H2513 Age-related nuclear cataract, bilateral: Secondary | ICD-10-CM | POA: Diagnosis not present

## 2019-01-28 DIAGNOSIS — H40053 Ocular hypertension, bilateral: Secondary | ICD-10-CM | POA: Diagnosis not present

## 2019-01-28 DIAGNOSIS — Z9889 Other specified postprocedural states: Secondary | ICD-10-CM | POA: Diagnosis not present

## 2019-01-28 DIAGNOSIS — H40033 Anatomical narrow angle, bilateral: Secondary | ICD-10-CM | POA: Diagnosis not present

## 2019-02-03 ENCOUNTER — Other Ambulatory Visit: Payer: Self-pay | Admitting: Family Medicine

## 2019-02-03 NOTE — Telephone Encounter (Signed)
Ok to refill??  Last office visit 12/18/2018.  Last refill 12/07/2018, #1 refill.

## 2019-02-11 ENCOUNTER — Telehealth: Payer: Self-pay | Admitting: General Surgery

## 2019-02-11 NOTE — Telephone Encounter (Signed)
Left a message on patients mobile phone to call our office to pre-screen for office visit.

## 2019-02-12 ENCOUNTER — Other Ambulatory Visit: Payer: Self-pay

## 2019-02-12 ENCOUNTER — Telehealth: Payer: Self-pay | Admitting: General Surgery

## 2019-02-12 ENCOUNTER — Ambulatory Visit (INDEPENDENT_AMBULATORY_CARE_PROVIDER_SITE_OTHER): Payer: Medicare Other | Admitting: Internal Medicine

## 2019-02-12 DIAGNOSIS — R1110 Vomiting, unspecified: Secondary | ICD-10-CM | POA: Diagnosis not present

## 2019-02-12 DIAGNOSIS — R110 Nausea: Secondary | ICD-10-CM | POA: Diagnosis not present
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
## 2019-02-12 NOTE — Patient Instructions (Addendum)
Sorry you are still not feeling well.  As we discussed we will schedule an upper GI endoscopy to evaluate your problems and to look for problems in the esophagus, stomach or upper intestine called the duodenum.   I appreciate the opportunity to care for you. Silvano Rusk, MD, [facility]

## 2019-02-12 NOTE — Telephone Encounter (Signed)
Covid-19 screening questions  Have you traveled in the last 14 days? If yes where? YES, went [place]  Do you now or have you had a fever in the last 14 days? NO  Do you have any respiratory symptoms of shortness of breath or cough now or in the last 14 days? NO  Do you have any family members or close contacts with diagnosed or suspected Covid-19 in the past 14 days?NO  Have you been tested for Covid-19 and found to be positive? NO

## 2019-02-12 NOTE — Telephone Encounter (Signed)
Patient decided to not be seen in person and do a phone visit later today. Discussed with Patti Martinique, [place] and she will call to reschedule the patient.

## 2019-02-19 ENCOUNTER — Telehealth: Payer: Self-pay | Admitting: *Deleted

## 2019-02-19 NOTE — Telephone Encounter (Signed)
Covid-19 screening questions  Have you traveled in the last 14 days? No. If yes where?  Do you now or have you had a fever in the last 14 days? No.  Do you have any respiratory symptoms of shortness of breath or cough now or in the last 14 days? No.  Do you have any family members or close contacts with diagnosed or suspected Covid-19 in the past 14 days? No.  Have you been tested for Covid-19 and found to be positive? No.       

## 2019-02-22 ENCOUNTER — Ambulatory Visit (AMBULATORY_SURGERY_CENTER): Payer: Medicare Other | Admitting: Internal Medicine

## 2019-02-22 ENCOUNTER — Encounter: Payer: Self-pay | Admitting: Internal Medicine

## 2019-02-22 ENCOUNTER — Encounter: Payer: Medicare Other | Admitting: Internal Medicine

## 2019-02-22 ENCOUNTER — Other Ambulatory Visit: Payer: Self-pay

## 2019-02-22 VITALS — BP 109/68 | HR 67 | Temp 98.6°F | Resp 10 | Ht 61.0 in | Wt 180.0 lb

## 2019-02-22 DIAGNOSIS — R110 Nausea: Secondary | ICD-10-CM

## 2019-02-22 DIAGNOSIS — R1310 Dysphagia, unspecified: Secondary | ICD-10-CM | POA: Diagnosis not present

## 2019-02-22 DIAGNOSIS — R1319 Other dysphagia: Secondary | ICD-10-CM

## 2019-02-22 DIAGNOSIS — R112 Nausea with vomiting, unspecified: Secondary | ICD-10-CM

## 2019-02-22 DIAGNOSIS — I10 Essential (primary) hypertension: Secondary | ICD-10-CM | POA: Diagnosis not present

## 2019-02-22 MED ORDER — SODIUM CHLORIDE 0.9 % IV SOLN: 500.0000 mL | Freq: Once | INTRAVENOUS | Status: DC

## 2019-02-22 MED ORDER — ONDANSETRON HCL 4 MG PO TABS: 4.0000 mg | ORAL_TABLET | Freq: Three times a day (TID) | ORAL | 1 refills | Status: DC | PRN

## 2019-02-22 NOTE — Progress Notes (Signed)
No problems noted in the recovery room. maw 

## 2019-02-22 NOTE — Op Note (Signed)
[facility] Patient Name: Lynn [name] Procedure Date: 02/22/2019 11:01 AM MRN: [id number] Endoscopist: [name] , MD Age: 68 Referring MD:  Date of Birth: 1951-06-18 Gender: Female Account #: [id number] Procedure:                Upper GI endoscopy Indications:              Dysphagia, Nausea with vomiting Medicines:                Propofol per Anesthesia, Monitored Anesthesia Care Procedure:                Pre-Anesthesia Assessment:                           - Prior to the procedure, a History and Physical                            was performed, and patient medications and                            allergies were reviewed. The patient's tolerance of                            previous anesthesia was also reviewed. The risks                            and benefits of the procedure and the sedation                            options and risks were discussed with the patient.                            All questions were answered, and informed consent                            was obtained. Prior Anticoagulants: The patient has                            taken no previous anticoagulant or antiplatelet                            agents. ASA Grade Assessment: II - A patient with                            mild systemic disease. After reviewing the risks                            and benefits, the patient was deemed in                            satisfactory condition to undergo the procedure.                           After obtaining informed consent, the endoscope was  passed under direct vision. Throughout the                            procedure, the patient's blood pressure, pulse, and                            oxygen saturations were monitored continuously. The                            Endoscope was introduced through the mouth, and                            advanced to the second part of duodenum. The upper    GI endoscopy was accomplished without difficulty.                            The patient tolerated the procedure well. Scope In: Scope Out: Findings:                 The examined esophagus was moderately tortuous. The                            scope was withdrawn. Dilation was performed with a                            Maloney dilator with mild resistance at 64 Fr.                            Estimated blood loss: none.                           The exam was otherwise without abnormality.                           The cardia and gastric fundus were normal on                            retroflexion. Complications:            No immediate complications. Estimated Blood Loss:     Estimated blood loss: none. Impression:               - Tortuous esophagus.                           - The examination was otherwise normal.                           - No specimens collected. Recommendation:           - Patient has a contact number available for                            emergencies. The signs and symptoms of potential                            delayed complications were discussed with the  patient. Return to normal activities tomorrow.                            Written discharge instructions were provided to the                            patient.                           - Continue present medications.                           - Clear liquids x 1 hour then soft foods rest of                            day. Start prior diet tomorrow.                           - ONDANSETRON PRN NAUSEA                           MAY NEED SOMETHING LIKE MIRTAZAPINE - SUSPECT                            NAUSEA IS RELATED TO ANXIETY                           SHE IS TO CALL AND MAKE JULY APPOINTMENT                           SHE DESCRIBED DYSPHAGIA THAT PRECEDED DENTAL                            EXTRACTIONS IN PRE-PROCEDURE INTERVIEW TODAY - SO                            DILATED THE  TORTUOUS ESOPHAGUS ? DYSMOTILITY [name], MD 02/22/2019 11:24:24 AM This report has been signed electronically.

## 2019-02-22 NOTE — Progress Notes (Signed)
Pt. Reports no change in her medical or surgical history since her pre-visit 02/12/2019.

## 2019-02-22 NOTE — Progress Notes (Signed)
Report given to PACU, vss 

## 2019-02-22 NOTE — Patient Instructions (Addendum)
I stretched the esophagus to see if that will help you swallow better.  I am prescribing ondansetron to help with the nausea - I think that might be coming from anxiety actually.  Please call soon and make a follow-up appointment for July  I appreciate the opportunity to care for you. [name], MD, FACG      YOU HAD AN ENDOSCOPIC PROCEDURE TODAY AT [facility]:   Refer to the procedure report that was given to you for any specific questions about what was found during the examination.  If the procedure report does not answer your questions, please call your gastroenterologist to clarify.  If you requested that your care partner not be given the details of your procedure findings, then the procedure report has been included in a sealed envelope for you to review at your convenience later.  YOU SHOULD EXPECT: Some feelings of bloating in the abdomen. Passage of more gas than usual.  Walking can help get rid of the air that was put into your GI tract during the procedure and reduce the bloating. If you had a lower endoscopy (such as a colonoscopy or flexible sigmoidoscopy) you may notice spotting of blood in your stool or on the toilet paper. If you underwent a bowel prep for your procedure, you may not have a normal bowel movement for a few days.  Please Note:  You might notice some irritation and congestion in your nose or some drainage.  This is from the oxygen used during your procedure.  There is no need for concern and it should clear up in a day or so.  SYMPTOMS TO REPORT IMMEDIATELY:    Following upper endoscopy (EGD)  Vomiting of blood or coffee ground material  New chest pain or pain under the shoulder blades  Painful or persistently difficult swallowing  New shortness of breath  Fever of 100F or higher  Black, tarry-looking stools  For urgent or emergent issues, a gastroenterologist can be reached at any hour by calling [phone].    DIET: Please follow the dilatation diet the rest of today.  Handout was given to you on discharge.  Drink plenty of fluids but you should avoid alcoholic beverages for 24 hours.  ACTIVITY:  You should plan to take it easy for the rest of today and you should NOT DRIVE or use heavy machinery until tomorrow (because of the sedation medicines used during the test).    FOLLOW UP: Our staff will call the number listed on your records 48-72 hours following your procedure to check on you and address any questions or concerns that you may have regarding the information given to you following your procedure. If we do not reach you, we will leave a message.  We will attempt to reach you two times.  During this call, we will ask if you have developed any symptoms of COVID 19. If you develop any symptoms (ie: fever, flu-like symptoms, shortness of breath, cough etc.) before then, please call [phone].  If you test positive for Covid 19 in the 2 weeks post procedure, please call and report this information to [place].    If any biopsies were taken you will be contacted by phone or by letter within the next 1-3 weeks.  Please call us at [phone] if you have not heard about the biopsies in 3 weeks.    SIGNATURES/CONFIDENTIALITY: You and/or your care partner have signed paperwork which will be entered into your electronic  medical record.  These signatures attest to the fact that that the information above on your After Visit Summary has been reviewed and is understood.  Full responsibility of the confidentiality of this discharge information lies with you and/or your care-partner.   Handouts were given to your care partner on  The dilatation diet to follow the rest of today. Dr. Carlean Purl sent into your pharmacy for ONDANSETRON to take as needed for nausea. You may resume your current medications today. Call and make an appointment for July. Please call if any questions or concerns.

## 2019-02-24 ENCOUNTER — Telehealth: Payer: Self-pay | Admitting: *Deleted

## 2019-02-24 NOTE — Telephone Encounter (Signed)
  Follow up Call-  Call back number 02/22/2019  Post procedure Call Back phone  # [phone]  Permission to leave phone message No  Some recent data might be hidden     Patient questions:  Do you have a fever, pain , or abdominal swelling? No. Pain Score  0 *  Have you tolerated food without any problems? Yes.    Have you been able to return to your normal activities? Yes.    Do you have any questions about your discharge instructions: Diet   No. Medications  No. Follow up visit  No.  Do you have questions or concerns about your Care? No.  Actions: * If pain score is 4 or above: No action needed, pain <4.   1. Have you developed a fever since your procedure? NO  2.   Have you had an respiratory symptoms (SOB or cough) since your procedure? NO  3.   Have you tested positive for COVID 19 since your procedure NO 4.   Have you had any family members/close contacts diagnosed with the COVID 19 since your procedure?  NO   If yes to any of these questions please route to Joylene John, RN and Alphonsa Gin, RN.

## 2019-03-23 ENCOUNTER — Ambulatory Visit: Payer: Medicare Other | Admitting: Internal Medicine

## 2019-04-04 ENCOUNTER — Other Ambulatory Visit: Payer: Self-pay | Admitting: Family Medicine

## 2019-04-04 DIAGNOSIS — F411 Generalized anxiety disorder: Secondary | ICD-10-CM

## 2019-04-08 ENCOUNTER — Other Ambulatory Visit: Payer: Self-pay | Admitting: Family Medicine

## 2019-04-09 NOTE — Telephone Encounter (Signed)
Ok to refill??  Last office visit 12/18/2018.  Last refill 02/03/2019, #1 refill.

## 2019-05-13 DIAGNOSIS — Z1231 Encounter for screening mammogram for malignant neoplasm of breast: Secondary | ICD-10-CM | POA: Diagnosis not present

## 2019-05-13 DIAGNOSIS — Z803 Family history of malignant neoplasm of breast: Secondary | ICD-10-CM | POA: Diagnosis not present

## 2019-05-13 LAB — HM MAMMOGRAPHY

## 2019-05-14 ENCOUNTER — Encounter: Payer: Self-pay | Admitting: *Deleted

## 2019-06-04 ENCOUNTER — Other Ambulatory Visit: Payer: Self-pay | Admitting: Family Medicine

## 2019-06-08 ENCOUNTER — Other Ambulatory Visit: Payer: Self-pay | Admitting: Family Medicine

## 2019-06-10 ENCOUNTER — Other Ambulatory Visit: Payer: Self-pay | Admitting: Family Medicine

## 2019-06-10 NOTE — Telephone Encounter (Signed)
Last office visit 12/18/2018 Last refilled:04/09/2019

## 2019-06-25 DIAGNOSIS — Z23 Encounter for immunization: Secondary | ICD-10-CM | POA: Diagnosis not present

## 2019-08-06 ENCOUNTER — Other Ambulatory Visit: Payer: Self-pay | Admitting: Family Medicine

## 2019-08-06 NOTE — Telephone Encounter (Signed)
Ok to refill??  Last office visit 12/18/2018.  Last refill 06/11/2019, #1 refill.

## 2019-08-11 ENCOUNTER — Other Ambulatory Visit: Payer: Self-pay

## 2019-09-08 ENCOUNTER — Other Ambulatory Visit: Payer: Self-pay | Admitting: Family Medicine

## 2019-09-08 DIAGNOSIS — F411 Generalized anxiety disorder: Secondary | ICD-10-CM

## 2019-09-15 ENCOUNTER — Other Ambulatory Visit: Payer: Self-pay | Admitting: Surgery

## 2019-09-15 DIAGNOSIS — R109 Unspecified abdominal pain: Secondary | ICD-10-CM

## 2019-10-01 ENCOUNTER — Other Ambulatory Visit: Payer: Self-pay

## 2019-10-01 ENCOUNTER — Ambulatory Visit
Admission: RE | Admit: 2019-10-01 | Discharge: 2019-10-01 | Disposition: A | Discharge status: Home / Self Care | Payer: Medicare Other | Source: Ambulatory Visit | Attending: Surgery | Admitting: Surgery

## 2019-10-01 DIAGNOSIS — R109 Unspecified abdominal pain: Secondary | ICD-10-CM

## 2019-10-01 DIAGNOSIS — R110 Nausea: Secondary | ICD-10-CM | POA: Diagnosis not present

## 2019-10-01 MED ORDER — IOPAMIDOL (ISOVUE-300) INJECTION 61%
100.0000 mL | Freq: Once | INTRAVENOUS | Status: AC | PRN
  Administered 2019-10-01: 100 mL via INTRAVENOUS

## 2019-10-06 ENCOUNTER — Other Ambulatory Visit: Payer: Self-pay | Admitting: Family Medicine

## 2019-10-07 NOTE — Telephone Encounter (Signed)
Ok to refill??  Last office visit 12/18/2018.  Last refill 08/06/2019, #1 refill.

## 2019-11-07 ENCOUNTER — Other Ambulatory Visit: Payer: Self-pay | Admitting: Family Medicine

## 2019-12-01 ENCOUNTER — Other Ambulatory Visit: Payer: Self-pay | Admitting: Family Medicine

## 2019-12-01 NOTE — Telephone Encounter (Signed)
Pt  Needs appt.

## 2019-12-01 NOTE — Telephone Encounter (Signed)
Ok to refill??  Last office visit 12/18/2018.  Last refill 10/08/2019, #1 refill.

## 2019-12-10 ENCOUNTER — Ambulatory Visit: Payer: Medicare Other | Admitting: Family Medicine

## 2020-01-03 ENCOUNTER — Other Ambulatory Visit: Payer: Self-pay | Admitting: Family Medicine

## 2020-01-03 MED ORDER — ALPRAZOLAM 1 MG PO TABS: 1.0000 mg | ORAL_TABLET | Freq: Two times a day (BID) | ORAL | 0 refills | Status: DC

## 2020-01-03 NOTE — Telephone Encounter (Signed)
Ok to refill??  Last office visit 12/18/2018.  Last refill 12/01/2019.  Of note, letter sent to patient to schedule appointment.

## 2020-01-03 NOTE — Addendum Note (Signed)
Addended by: Vic Blackbird F on: 01/03/2020 01:45 PM   Modules accepted: Orders

## 2020-01-11 ENCOUNTER — Encounter: Payer: Self-pay | Admitting: Family Medicine

## 2020-01-11 ENCOUNTER — Telehealth: Payer: Self-pay | Admitting: Family Medicine

## 2020-01-11 ENCOUNTER — Ambulatory Visit (INDEPENDENT_AMBULATORY_CARE_PROVIDER_SITE_OTHER): Payer: Medicare Other | Admitting: Family Medicine

## 2020-01-11 ENCOUNTER — Other Ambulatory Visit: Payer: Self-pay

## 2020-01-11 VITALS — BP 118/64 | HR 68 | Temp 98.2°F | Resp 14 | Ht 61.0 in | Wt 165.0 lb

## 2020-01-11 DIAGNOSIS — Z23 Encounter for immunization: Secondary | ICD-10-CM | POA: Diagnosis not present

## 2020-01-11 DIAGNOSIS — I10 Essential (primary) hypertension: Secondary | ICD-10-CM

## 2020-01-11 DIAGNOSIS — F411 Generalized anxiety disorder: Secondary | ICD-10-CM

## 2020-01-11 DIAGNOSIS — F320 Major depressive disorder, single episode, mild: Secondary | ICD-10-CM

## 2020-01-11 DIAGNOSIS — K219 Gastro-esophageal reflux disease without esophagitis: Secondary | ICD-10-CM

## 2020-01-11 DIAGNOSIS — R110 Nausea: Secondary | ICD-10-CM

## 2020-01-11 DIAGNOSIS — E559 Vitamin D deficiency, unspecified: Secondary | ICD-10-CM

## 2020-01-11 DIAGNOSIS — E785 Hyperlipidemia, unspecified: Secondary | ICD-10-CM | POA: Diagnosis not present

## 2020-01-11 DIAGNOSIS — F4310 Post-traumatic stress disorder, unspecified: Secondary | ICD-10-CM

## 2020-01-11 DIAGNOSIS — Z1211 Encounter for screening for malignant neoplasm of colon: Secondary | ICD-10-CM

## 2020-01-11 MED ORDER — TRIAMCINOLONE ACETONIDE 0.1 % EX CREA
1.0000 | TOPICAL_CREAM | Freq: Two times a day (BID) | CUTANEOUS | 0 refills | Status: DC
Start: 2020-01-11 — End: 2020-04-28

## 2020-01-11 MED ORDER — ONDANSETRON 4 MG PO TBDP: 4.0000 mg | ORAL_TABLET | Freq: Three times a day (TID) | ORAL | 1 refills | Status: DC | PRN

## 2020-01-11 MED ORDER — BENAZEPRIL HCL 10 MG PO TABS: 10.0000 mg | ORAL_TABLET | Freq: Every day | ORAL | 1 refills | Status: DC

## 2020-01-11 NOTE — Telephone Encounter (Signed)
[facility]  Patient forgot to say this morning that she is eat up with poison ivy  Would like to know if something can be called in for her  [phone]

## 2020-01-11 NOTE — Assessment & Plan Note (Signed)
History of gastric ulcer but not seen with EGD.  Chronic nausea with dry heaves.  No mass or tumor noted with EGD nothing found CT of abdomen pelvis no gallbladder etiology.  Refer her back to gastroenterology.  She is also due for colonoscopy given her Zofran ODT  Continue PPI

## 2020-01-11 NOTE — Assessment & Plan Note (Signed)
Recheck lipids and LFT 

## 2020-01-11 NOTE — Assessment & Plan Note (Signed)
Depressive symptoms managed with paxil and prn xanax

## 2020-01-11 NOTE — Addendum Note (Signed)
Addended by: Sheral Flow on: 01/11/2020 09:50 AM   Modules accepted: Orders

## 2020-01-11 NOTE — Patient Instructions (Addendum)
Decrease the benezapril to 10mg  once a day  Referral to Dr. Carlean Purl for the nausea and colon cancer screening F/U 4 months for Physical

## 2020-01-11 NOTE — Assessment & Plan Note (Signed)
On review of her records blood pressure has been low normotensive with the weight loss.  Will reduce benazepril to 10 mg once a day continue amlodipine

## 2020-01-11 NOTE — Progress Notes (Signed)
Subjective:    Patient ID: Lynn [name], female    DOB: 09-18-1950, 69 y.o.   MRN: [id number]  Patient presents for Follow-up (is fasting) Patient here to follow-up chronic medical problems.  Medications reviewed Last visit with via telehealth in April 2020 in setting of COVID-19 pandemic Due for fasting labs  Hypertension she is taking amlodipine and Lotensin without any difficulty  Hyperlipidemia she is on pravastatin 80 mg once a day  Major depressive disorder/PTSD  and anxiety she has been maintained on Paxil 40 mg as well as alprazolam 1 mg twice daily as needed for anxiety.  GERD she is on Dexilant and Pepcid, she has dry heaves every morning and feels sick on the stomach, it last for about 10 minutes then stops She has tried not to eat late at night because when she wakes up in the middle of the night she feels sick She gets epigastric pain with the heaves CT abd was done in Jan 2021 which was normal. Rockne Coons was normal  She had EGD in June 2020  , no cause for her symptoms found    She is exercising regulary and staying active, weight down 15lbs since last summer   Vitamin D def- taking 1000 IU once a day    She has a few spots of poison oak on her   Mammo UTD  Due for prevnar 13   Review Of Systems:  GEN- denies fatigue, fever, weight loss,weakness, recent illness HEENT- denies eye drainage, change in vision, nasal discharge, CVS- denies chest pain, palpitations RESP- denies SOB, cough, wheeze ABD- +N/ denies V, change in stools, abd pain GU- denies dysuria, hematuria, dribbling, incontinence MSK- denies joint pain, muscle aches, injury Neuro- denies headache, dizziness, syncope, seizure activity       Objective:    BP 118/64   Pulse 68   Temp 98.2 F (36.8 C) (Temporal)   Resp 14   Ht 5\' 1"  (1.549 m)   Wt 165 lb (74.8 kg)   SpO2 94%   BMI 31.18 kg/m  GEN- NAD, alert and oriented x3 HEENT- PERRL, EOMI, non injected sclera, pink  conjunctiva, MMM, oropharynx clear Neck- Supple, no thyromegaly , no JVD CVS- RRR, 2/6 SEM, , no click or RUB RESP-CTAB ABD-NABS,soft,NT,ND Psych- normal affect and mood, good eye contact, very polite  EXT- No edema Pulses- Radial,2+       Assessment & Plan:      Problem List Items Addressed This Visit      Unprioritized   Depression   Generalized anxiety disorder   GERD (gastroesophageal reflux disease)    History of gastric ulcer but not seen with EGD.  Chronic nausea with dry heaves.  No mass or tumor noted with EGD nothing found CT of abdomen pelvis no gallbladder etiology.  Refer her back to gastroenterology.  She is also due for colonoscopy given her Zofran ODT  Continue PPI      Relevant Medications   ondansetron (ZOFRAN ODT) 4 MG disintegrating tablet   HLD (hyperlipidemia)    Recheck lipids and LFT       Relevant Medications   benazepril (LOTENSIN) 10 MG tablet   Other Relevant Orders   Lipid panel   Hypertension - Primary    On review of her records blood pressure has been low normotensive with the weight loss.  Will reduce benazepril to 10 mg once a day continue amlodipine      Relevant Medications   benazepril (LOTENSIN) 10 MG  tablet   Other Relevant Orders   CBC with Differential/Platelet   Comprehensive metabolic panel   PTSD (post-traumatic stress disorder)    Depressive symptoms managed with paxil and prn xanax      Vitamin D deficiency   Relevant Orders   Vitamin D, 25-hydroxy    Other Visit Diagnoses    Colon cancer screening       Relevant Orders   Ambulatory referral to Gastroenterology   Chronic nausea       Relevant Orders   Ambulatory referral to Gastroenterology      Note: This dictation was prepared with Dragon dictation along with smaller phrase technology. Any transcriptional errors that result from this process are unintentional.

## 2020-01-11 NOTE — Telephone Encounter (Signed)
She ha a few spots on her arm I sent in Triamcinolone cream BID

## 2020-01-11 NOTE — Telephone Encounter (Signed)
Call placed to patient and patient made aware.  

## 2020-01-11 NOTE — Telephone Encounter (Signed)
MD please advise

## 2020-01-12 ENCOUNTER — Encounter: Payer: Self-pay | Admitting: Internal Medicine

## 2020-01-12 LAB — CBC WITH DIFFERENTIAL/PLATELET
Absolute Monocytes: 621 cells/uL (ref 200–950)
Basophils Absolute: 83 cells/uL (ref 0–200)
Basophils Relative: 1.2 %
Eosinophils Absolute: 200 cells/uL (ref 15–500)
Eosinophils Relative: 2.9 %
HCT: 43.2 % (ref 35.0–45.0)
Hemoglobin: 13.8 g/dL (ref 11.7–15.5)
Lymphs Abs: 2450 cells/uL (ref 850–3900)
MCH: 29.9 pg (ref 27.0–33.0)
MCHC: 31.9 g/dL — ABNORMAL LOW (ref 32.0–36.0)
MCV: 93.5 fL (ref 80.0–100.0)
MPV: 12.1 fL (ref 7.5–12.5)
Monocytes Relative: 9 %
Neutro Abs: 3547 cells/uL (ref 1500–7800)
Neutrophils Relative %: 51.4 %
Platelets: 217 10*3/uL (ref 140–400)
RBC: 4.62 10*6/uL (ref 3.80–5.10)
RDW: 12.3 % (ref 11.0–15.0)
Total Lymphocyte: 35.5 %
WBC: 6.9 10*3/uL (ref 3.8–10.8)

## 2020-01-12 LAB — LIPID PANEL
Cholesterol: 177 mg/dL (ref <200)
HDL: 53 mg/dL (ref >=50)
LDL Cholesterol (Calc): 105 mg/dL (calc) — ABNORMAL HIGH
Non-HDL Cholesterol (Calc): 124 mg/dL (calc) (ref <130)
Total CHOL/HDL Ratio: 3.3 (calc) (ref <5.0)
Triglycerides: 92 mg/dL (ref <150)

## 2020-01-12 LAB — COMPREHENSIVE METABOLIC PANEL
AG Ratio: 1.6 (calc) (ref 1.0–2.5)
ALT: 11 U/L (ref 6–29)
AST: 19 U/L (ref 10–35)
Albumin: 4.4 g/dL (ref 3.6–5.1)
Alkaline phosphatase (APISO): 71 U/L (ref 37–153)
BUN: 21 mg/dL (ref 7–25)
CO2: 31 mmol/L (ref 20–32)
Calcium: 9.8 mg/dL (ref 8.6–10.4)
Chloride: 106 mmol/L (ref 98–110)
Creat: 0.7 mg/dL (ref 0.50–0.99)
Globulin: 2.8 g/dL (calc) (ref 1.9–3.7)
Glucose, Bld: 108 mg/dL — ABNORMAL HIGH (ref 65–99)
Potassium: 4.4 mmol/L (ref 3.5–5.3)
Sodium: 142 mmol/L (ref 135–146)
Total Bilirubin: 0.7 mg/dL (ref 0.2–1.2)
Total Protein: 7.2 g/dL (ref 6.1–8.1)

## 2020-01-12 LAB — VITAMIN D 25 HYDROXY (VIT D DEFICIENCY, FRACTURES): Vit D, 25-Hydroxy: 41 ng/mL (ref 30–100)

## 2020-01-14 ENCOUNTER — Encounter: Payer: Self-pay | Admitting: *Deleted

## 2020-02-02 ENCOUNTER — Other Ambulatory Visit: Payer: Self-pay | Admitting: Family Medicine

## 2020-02-02 NOTE — Telephone Encounter (Signed)
Ok to refill??  Last office visit 01/11/2020.  Last refill 01/03/2020.

## 2020-02-24 ENCOUNTER — Other Ambulatory Visit: Payer: Self-pay | Admitting: Family Medicine

## 2020-03-08 ENCOUNTER — Ambulatory Visit (AMBULATORY_SURGERY_CENTER): Payer: Self-pay | Admitting: *Deleted

## 2020-03-08 VITALS — Ht 61.0 in | Wt 164.0 lb

## 2020-03-08 DIAGNOSIS — Z01818 Encounter for other preprocedural examination: Secondary | ICD-10-CM

## 2020-03-08 DIAGNOSIS — Z1211 Encounter for screening for malignant neoplasm of colon: Secondary | ICD-10-CM

## 2020-03-08 NOTE — Progress Notes (Signed)
covid test 03-20-20 at 1:30  Pt is aware that care partner will wait in the car during procedure; if they feel like they will be too hot or cold to wait in the car; they may wait in the 4 th floor lobby. Patient is aware to bring only one care partner. We want them to wear a mask (we do not have any that we can provide them), practice social distancing, and we will check their temperatures when they get here.  I did remind the patient that their care partner needs to stay in the parking lot the entire time and have a cell phone available, we will call them when the pt is ready for discharge. Patient will wear mask into building.   No trouble with anesthesia, difficulty with intubation or hx/fam hx of malignant hyperthermia per pt   No egg or soy allergy  No home oxygen use   No medications for weight loss taken  Pt constipation issues-2 day MIralax prep given

## 2020-03-17 ENCOUNTER — Ambulatory Visit (INDEPENDENT_AMBULATORY_CARE_PROVIDER_SITE_OTHER): Payer: Medicare Other

## 2020-03-17 ENCOUNTER — Other Ambulatory Visit: Payer: Self-pay | Admitting: Internal Medicine

## 2020-03-17 DIAGNOSIS — Z1159 Encounter for screening for other viral diseases: Secondary | ICD-10-CM

## 2020-03-18 LAB — SARS CORONAVIRUS 2 (TAT 6-24 HRS): SARS Coronavirus 2: NEGATIVE

## 2020-03-22 ENCOUNTER — Encounter: Payer: Self-pay | Admitting: Internal Medicine

## 2020-03-22 ENCOUNTER — Other Ambulatory Visit: Payer: Self-pay

## 2020-03-22 ENCOUNTER — Ambulatory Visit (AMBULATORY_SURGERY_CENTER): Payer: Medicare Other | Admitting: Internal Medicine

## 2020-03-22 VITALS — BP 129/70 | HR 60 | Temp 96.8°F | Resp 17 | Ht 61.0 in | Wt 164.0 lb

## 2020-03-22 DIAGNOSIS — D122 Benign neoplasm of ascending colon: Secondary | ICD-10-CM

## 2020-03-22 DIAGNOSIS — D120 Benign neoplasm of cecum: Secondary | ICD-10-CM | POA: Diagnosis not present

## 2020-03-22 DIAGNOSIS — D124 Benign neoplasm of descending colon: Secondary | ICD-10-CM

## 2020-03-22 DIAGNOSIS — Z86010 Personal history of colonic polyps: Secondary | ICD-10-CM

## 2020-03-22 DIAGNOSIS — Z1211 Encounter for screening for malignant neoplasm of colon: Secondary | ICD-10-CM

## 2020-03-22 DIAGNOSIS — D123 Benign neoplasm of transverse colon: Secondary | ICD-10-CM

## 2020-03-22 MED ORDER — SODIUM CHLORIDE 0.9 % IV SOLN: 500.0000 mL | Freq: Once | INTRAVENOUS | Status: DC

## 2020-03-22 NOTE — Progress Notes (Signed)
Called to room to assist during endoscopic procedure.  Patient ID and intended procedure confirmed with present staff. Received instructions for my participation in the procedure from the performing physician.  

## 2020-03-22 NOTE — Progress Notes (Signed)
Pt's states no medical or surgical changes since previsit or office visit.  CW - vitals 

## 2020-03-22 NOTE — Progress Notes (Signed)
Report given to PACU, vss 

## 2020-03-22 NOTE — Op Note (Signed)
[place] Patient Name: Lynn [name] Procedure Date: 03/22/2020 9:56 AM MRN: [id number] Endoscopist: [name] , MD Age: 69 Referring MD:  Date of Birth: 12-15-1950 Gender: Female Account #: [id number] Procedure:                Colonoscopy Indications:              High risk colon cancer surveillance: Personal                            history of colonic polyps Medicines:                Propofol per Anesthesia, Monitored Anesthesia Care Procedure:                Pre-Anesthesia Assessment:                           - Prior to the procedure, a History and Physical                            was performed, and patient medications and                            allergies were reviewed. The patient's tolerance of                            previous anesthesia was also reviewed. The risks                            and benefits of the procedure and the sedation                            options and risks were discussed with the patient.                            All questions were answered, and informed consent                            was obtained. Prior Anticoagulants: The patient has                            taken no previous anticoagulant or antiplatelet                            agents. ASA Grade Assessment: III - A patient with                            severe systemic disease. After reviewing the risks                            and benefits, the patient was deemed in                            satisfactory condition to undergo the procedure.  After obtaining informed consent, the colonoscope                            was passed under direct vision. Throughout the                            procedure, the patient's blood pressure, pulse, and                            oxygen saturations were monitored continuously. The                            Colonoscope was introduced through the anus and                            advanced to  the the cecum, identified by                            appendiceal orifice and ileocecal valve. The                            colonoscopy was performed without difficulty. The                            patient tolerated the procedure well. The quality                            of the bowel preparation was adequate. The bowel                            preparation used was Miralax via split dose                            instruction. The ileocecal valve, appendiceal                            orifice, and rectum were photographed. Scope In: 10:07:22 AM Scope Out: 10:25:36 AM Scope Withdrawal Time: 0 hours 14 minutes 10 seconds  Total Procedure Duration: 0 hours 18 minutes 14 seconds  Findings:                 The perianal exam findings include skin tag ? other                            lesion.                           Three sessile polyps were found in the descending                            colon and transverse colon. The polyps were                            diminutive in size. These polyps were removed with  a cold snare. Resection and retrieval were                            complete. Verification of patient identification                            for the specimen was done. Estimated blood loss was                            minimal.                           Two sessile polyps were found in the ascending                            colon and cecum. The polyps were 1 mm in size.                            These polyps were removed with a cold biopsy                            forceps. Resection and retrieval were complete.                            Verification of patient identification for the                            specimen was done. Estimated blood loss was minimal.                           Multiple diverticula were found in the sigmoid                            colon.                           The exam was otherwise without abnormality  on                            direct and retroflexion views. Complications:            No immediate complications. Estimated Blood Loss:     Estimated blood loss was minimal. Impression:               - Skin tag ? other lesion found on perianal exam.                           - Three diminutive polyps in the descending colon                            and in the transverse colon, removed with a cold                            snare. Resected and retrieved.                           -  Two 1 mm polyps in the ascending colon and in the                            cecum, removed with a cold biopsy forceps. Resected                            and retrieved.                           - Diverticulosis in the sigmoid colon.                           - The examination was otherwise normal on direct                            and retroflexion views.                           - Personal history of colonic polyps. Hyperplastic                            including ascending 2011 Recommendation:           - Patient has a contact number available for                            emergencies. The signs and symptoms of potential                            delayed complications were discussed with the                            patient. Return to normal activities tomorrow.                            Written discharge instructions were provided to the                            patient.                           - Resume previous diet.                           - Continue present medications.                           - Await pathology results.                           - Repeat colonoscopy is recommended. The                            colonoscopy date will be determined after pathology                            results from  today's exam become available for                            review.                           - refer to [facility] colorectal surgery tro evaluate anal                            polypoid  lesion [name], MD 03/22/2020 10:39:29 AM This report has been signed electronically.

## 2020-03-22 NOTE — Patient Instructions (Addendum)
I found and removed 5 tiny polyps. You also have a condition called diverticulosis - common and not usually a problem. Please read the handout provided.  I think the anal lesion which you think is a hemorrhoid is worth being evaluated for removal by a surgeon and will send you to one.  I will let you know pathology results and when to have another routine colonoscopy by mail and/or My Chart.  I appreciate the opportunity to care for you. [name], MD, FACG    YOU HAD AN ENDOSCOPIC PROCEDURE TODAY AT [facility]:   Refer to the procedure report that was given to you for any specific questions about what was found during the examination.  If the procedure report does not answer your questions, please call your gastroenterologist to clarify.  If you requested that your care partner not be given the details of your procedure findings, then the procedure report has been included in a sealed envelope for you to review at your convenience later.  YOU SHOULD EXPECT: Some feelings of bloating in the abdomen. Passage of more gas than usual.  Walking can help get rid of the air that was put into your GI tract during the procedure and reduce the bloating. If you had a lower endoscopy (such as a colonoscopy or flexible sigmoidoscopy) you may notice spotting of blood in your stool or on the toilet paper. If you underwent a bowel prep for your procedure, you may not have a normal bowel movement for a few days.  Please Note:  You might notice some irritation and congestion in your nose or some drainage.  This is from the oxygen used during your procedure.  There is no need for concern and it should clear up in a day or so.  SYMPTOMS TO REPORT IMMEDIATELY:   Following lower endoscopy (colonoscopy or flexible sigmoidoscopy):  Excessive amounts of blood in the stool  Significant tenderness or worsening of abdominal pains  Swelling of the abdomen that is new, acute  Fever of 100F or  higher   For urgent or emergent issues, a gastroenterologist can be reached at any hour by calling [phone]. Do not use MyChart messaging for urgent concerns.    DIET:  We do recommend a small meal at first, but then you may proceed to your regular diet.  Drink plenty of fluids but you should avoid alcoholic beverages for 24 hours.  ACTIVITY:  You should plan to take it easy for the rest of today and you should NOT DRIVE or use heavy machinery until tomorrow (because of the sedation medicines used during the test).    FOLLOW UP: Our staff will call the number listed on your records 48-72 hours following your procedure to check on you and address any questions or concerns that you may have regarding the information given to you following your procedure. If we do not reach you, we will leave a message.  We will attempt to reach you two times.  During this call, we will ask if you have developed any symptoms of COVID 19. If you develop any symptoms (ie: fever, flu-like symptoms, shortness of breath, cough etc.) before then, please call [phone].  If you test positive for Covid 19 in the 2 weeks post procedure, please call and report this information to [place].    If any biopsies were taken you will be contacted by phone or by letter within the next 1-3 weeks.  Please call us at [phone] if you have not heard about the biopsies in 3 weeks.    SIGNATURES/CONFIDENTIALITY: You and/or your care partner have signed paperwork which will be entered into your electronic medical record.  These signatures attest to the fact that that the information above on your After Visit Summary has been reviewed and is understood.  Full responsibility of the confidentiality of this discharge information lies with you and/or your care-partner.   Resume medications. Information given on polyps and diverticulosis. Office will contact you for [facility]  Appointment.

## 2020-03-23 ENCOUNTER — Other Ambulatory Visit: Payer: Self-pay | Admitting: Family Medicine

## 2020-03-24 ENCOUNTER — Telehealth: Payer: Self-pay | Admitting: *Deleted

## 2020-03-24 NOTE — Telephone Encounter (Signed)
  Follow up Call-  Call back number 03/22/2020 02/22/2019  Post procedure Call Back phone  # [phone]  Permission to leave phone message Yes No  Some recent data might be hidden     Patient questions:  Do you have a fever, pain , or abdominal swelling? No. Pain Score  0 *  Have you tolerated food without any problems? Yes.    Have you been able to return to your normal activities? Yes.    Do you have any questions about your discharge instructions: Diet   No. Medications  No. Follow up visit  No.  Do you have questions or concerns about your Care? No.  Actions: * If pain score is 4 or above: No action needed, pain <4.  1. Have you developed a fever since your procedure? no  2.   Have you had an respiratory symptoms (SOB or cough) since your procedure? no  3.   Have you tested positive for COVID 19 since your procedure no  4.   Have you had any family members/close contacts diagnosed with the COVID 19 since your procedure?  no   If yes to any of these questions please route to Joylene John, RN and Erenest Rasher, RN

## 2020-03-29 ENCOUNTER — Other Ambulatory Visit: Payer: Self-pay | Admitting: Family Medicine

## 2020-03-29 DIAGNOSIS — D3132 Benign neoplasm of left choroid: Secondary | ICD-10-CM | POA: Diagnosis not present

## 2020-03-29 DIAGNOSIS — H2513 Age-related nuclear cataract, bilateral: Secondary | ICD-10-CM | POA: Diagnosis not present

## 2020-03-29 DIAGNOSIS — H04123 Dry eye syndrome of bilateral lacrimal glands: Secondary | ICD-10-CM | POA: Diagnosis not present

## 2020-03-29 DIAGNOSIS — H1045 Other chronic allergic conjunctivitis: Secondary | ICD-10-CM | POA: Diagnosis not present

## 2020-03-29 DIAGNOSIS — H40053 Ocular hypertension, bilateral: Secondary | ICD-10-CM | POA: Diagnosis not present

## 2020-03-30 ENCOUNTER — Telehealth: Payer: Self-pay

## 2020-03-30 NOTE — Telephone Encounter (Signed)
Patient has been referred to [facility] and will see Dr. Marcello Moores on 04/10/20 for anal polypoid lesion.

## 2020-03-31 ENCOUNTER — Telehealth: Payer: Self-pay | Admitting: Family Medicine

## 2020-03-31 NOTE — Chronic Care Management (AMB) (Signed)
  Chronic Care Management   Note  03/31/2020 Name: Lynn [name] MRN: [id number] DOB: 03/23/1951  Lynn [name] is a 69 y.o. year old female who is a primary care patient of [place], Modena Nunnery, MD. I reached out to [employer] by phone today in response to a referral sent by Lynn [name]'s PCP, Buelah Manis, Modena Nunnery, MD.   Lynn [name] was given information about Chronic Care Management services today including:  1. CCM service includes personalized support from designated clinical staff supervised by her physician, including individualized plan of care and coordination with other care providers 2. 24/7 contact phone numbers for assistance for urgent and routine care needs. 3. Service will only be billed when office clinical staff spend 20 minutes or more in a month to coordinate care. 4. Only one practitioner may furnish and bill the service in a calendar month. 5. The patient may stop CCM services at any time (effective at the end of the month) by phone call to the office staff.   Patient agreed to services and verbal consent obtained.   Follow up plan:  [place]

## 2020-04-04 ENCOUNTER — Other Ambulatory Visit: Payer: Self-pay | Admitting: Family Medicine

## 2020-04-04 NOTE — Telephone Encounter (Signed)
Requested Prescriptions   Pending Prescriptions Disp Refills  . ALPRAZolam (XANAX) 1 MG tablet [Pharmacy Med Name: ALPRAZOLAM 1 MG TABLET] 60 tablet 1    Sig: TAKE 1 TABLET BY MOUTH TWICE A DAY     Last OV 01/11/2020   Last written 02/02/2020

## 2020-04-05 ENCOUNTER — Other Ambulatory Visit: Payer: Self-pay | Admitting: *Deleted

## 2020-04-05 DIAGNOSIS — F411 Generalized anxiety disorder: Secondary | ICD-10-CM

## 2020-04-05 MED ORDER — PAROXETINE HCL 40 MG PO TABS: 40.0000 mg | ORAL_TABLET | Freq: Every day | ORAL | 1 refills | Status: DC

## 2020-04-05 MED ORDER — PRAVASTATIN SODIUM 80 MG PO TABS: 80.0000 mg | ORAL_TABLET | Freq: Every day | ORAL | 1 refills | Status: DC

## 2020-04-10 ENCOUNTER — Ambulatory Visit: Payer: Self-pay | Admitting: General Surgery

## 2020-04-10 DIAGNOSIS — K620 Anal polyp: Secondary | ICD-10-CM | POA: Diagnosis not present

## 2020-04-10 NOTE — H&P (Signed)
The patient is a 69 year old female who presents with anal lesions. 69 year old female who presents to the office for evaluation of an anal nodule seen on recent colonoscopy. Patient states that she has noticed this for approximately 10 years. She thought it was a hemorrhoid. It has slowly enlarged over that time. It is asymptomatic.   Problem List/Past Medical Leighton Ruff, MD; 6/56/8127 9:59 AM) [name] HERNIA, WITHOUT OBSTRUCTION OR GANGRENE (K43.2)  Diagnostic Studies History Leighton Ruff, MD; 01/31/16 9:59 AM) Colonoscopy 5-10 years ago Mammogram within last year Pap Smear 1-5 years ago  Allergies Darden Palmer, RMA; 04/10/2020 9:57 AM) Diclofenac *ANALGESICS - ANTI-INFLAMMATORY* Hives Hydrocodone-Acetaminophen *ANALGESICS - OPIOID* itching, nausea, vomiting Lipitor *ANTIHYPERLIPIDEMICS* Itching Niacin (Antihyperlipidemic) *ANTIHYPERLIPIDEMICS* Rash Zolpidem Tartrate *HYPNOTICS/SEDATIVES/SLEEP DISORDER AGENTS* Hives Allergies Reconciled  Medication History Darden Palmer, RMA; 04/10/2020 9:57 AM) Cyclobenzaprine HCl (10MG  Tablet, Oral as needed) Active. NexIUM (40MG  Capsule DR, Oral daily) Active. ALPRAZolam (1MG  Tablet, Oral daily) Active. AmLODIPine Besylate (10MG  Tablet, Oral daily) Active. Benazepril HCl (20MG  Tablet, Oral daily) Active. PARoxetine HCl (40MG  Tablet, Oral daily) Active. Pravastatin Sodium (80MG  Tablet, Oral daily) Active. Vitamin D (Cholecalciferol) (1000UNIT Capsule, Oral daily) Active. Dexilant (60MG  Capsule DR, Oral) Active. Famotidine (20MG  Tablet, Oral) Active. Medications Reconciled  Social History Leighton Ruff, MD; 4/94/4967 9:59 AM) Alcohol use Remotely quit alcohol use. Caffeine use Carbonated beverages, Coffee. No drug use Tobacco use Former smoker.  Family History Leighton Ruff, MD; 5/91/6384 9:59 AM) Arthritis Mother. Breast Cancer Mother. Colon Polyps Mother, Sister. Depression  Mother. Diabetes Mellitus Mother, Sister. Heart Disease Father, Mother, Sister. Heart disease in female family member before age 31 Hypertension Brother, Mother, Sister. Kidney Disease Mother, Sister.  Pregnancy / Birth History Leighton Ruff, MD; 6/65/9935 9:59 AM) Age at menarche 78 years. Age of menopause 22-55 Gravida 3 Irregular periods Maternal age 54-20 Para 2  Other Problems Leighton Ruff, MD; 03/16/7792 9:59 AM) Anxiety Disorder Arthritis Back Pain Gastric Ulcer Gastroesophageal Reflux Disease Hemorrhoids High blood pressure Hypercholesterolemia     Review of Systems Leighton Ruff MD; 05/20/91 9:59 AM) General Not Present- Appetite Loss, Chills, Fatigue, Fever, Night Sweats, Weight Gain and Weight Loss. Skin Not Present- Change in Wart/Mole, Dryness, Hives, Jaundice, New Lesions, Non-Healing Wounds, Rash and Ulcer. HEENT Present- Ringing in the Ears. Not Present- Earache, Hearing Loss, Hoarseness, Nose Bleed, Oral Ulcers, Seasonal Allergies, Sinus Pain, Sore Throat, Visual Disturbances, Wears glasses/contact lenses and Yellow Eyes. Respiratory Present- Snoring. Not Present- Bloody sputum, Chronic Cough, Difficulty Breathing and Wheezing. Breast Not Present- Breast Mass, Breast Pain, Nipple Discharge and Skin Changes. Cardiovascular Present- Leg Cramps. Not Present- Chest Pain, Difficulty Breathing Lying Down, Palpitations, Rapid Heart Rate, Shortness of Breath and Swelling of Extremities. Gastrointestinal Present- Abdominal Pain, Bloating, Difficulty Swallowing, Hemorrhoids and Nausea. Not Present- Bloody Stool, Change in Bowel Habits, Chronic diarrhea, Constipation, Excessive gas, Gets full quickly at meals, Indigestion, Rectal Pain and Vomiting. Female Genitourinary Not Present- Frequency, Nocturia, Painful Urination, Pelvic Pain and Urgency. Musculoskeletal Present- Back Pain, Joint Pain, Joint Stiffness, Muscle Pain, Muscle Weakness and Swelling of  Extremities. Neurological Not Present- Decreased Memory, Fainting, Headaches, Numbness, Seizures, Tingling, Tremor, Trouble walking and Weakness. Psychiatric Present- Anxiety and Depression. Not Present- Bipolar, Change in Sleep Pattern, Fearful and Frequent crying. Endocrine Present- Heat Intolerance and Hot flashes. Not Present- Cold Intolerance, Excessive Hunger, Hair Changes and New Diabetes. Hematology Not Present- Blood Thinners, Easy Bruising, Excessive bleeding, Gland problems, HIV and Persistent Infections. All other systems negative  Vitals Darden Palmer RMA; 04/10/2020 9:57 AM)  04/10/2020 9:57 AM Weight: 164.25 lb Height: 61in Body Surface Area: 1.74 m Body Mass Index: 31.03 kg/m  Temp.: 84F  Pulse: 77 (Regular)  P.OX: 96% (Room air) BP: 120/76(Sitting, Left Arm, Standard)        Physical Exam Leighton Ruff MD; 4/44/6190 10:03 AM)  General Mental Status-Alert. General Appearance-Cooperative.  Rectal Anorectal Exam External - Note: Polypoid mass arising from a stalk on the right posterior lateral perianal region. Internal - normal sphincter tone.    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 I recommended excision to evaluate this completely pathologically into help with anal hygiene. We discussed the procedure in detail including postoperative pain, and healing time. We will get this scheduled at her convenience. All questions were answered.

## 2020-04-28 ENCOUNTER — Other Ambulatory Visit: Payer: Self-pay | Admitting: Family Medicine

## 2020-05-11 ENCOUNTER — Other Ambulatory Visit: Payer: Self-pay

## 2020-05-11 ENCOUNTER — Encounter (HOSPITAL_BASED_OUTPATIENT_CLINIC_OR_DEPARTMENT_OTHER): Payer: Self-pay | Admitting: General Surgery

## 2020-05-11 NOTE — Progress Notes (Signed)
Spoke w/ via phone for pre-op interview---PT Lab needs dos---- I stat 8, ekg               Lab results------echo 08-21-2018 epic ( see dr Idamae Lusher note no action needed with echo results) COVID test ------05-15-2020 1400 Arrive at -------795 am 05-18-2020 NPO after MN NO Solid Food.  Clear liquids from MN until---545 am then npo Medications to take morning of surgery ----- Diabetic medication -----xanax, paxil, amlodipine, dexilant Patient Special Instructions -----none Pre-Op special Istructions -----none Patient verbalized understanding of instructions that were given at this phone interview. Patient denies shortness of breath, chest pain, fever, cough at this phone interview.  Pt has mild osa has not used cpap in years, last sleep study 20 yrs ago per pt.

## 2020-05-15 ENCOUNTER — Ambulatory Visit (INDEPENDENT_AMBULATORY_CARE_PROVIDER_SITE_OTHER): Payer: Medicare Other | Admitting: Family Medicine

## 2020-05-15 ENCOUNTER — Other Ambulatory Visit (HOSPITAL_COMMUNITY)
Admission: RE | Admit: 2020-05-15 | Discharge: 2020-05-15 | Disposition: A | Discharge status: Home / Self Care | Payer: Medicare Other | Source: Ambulatory Visit | Attending: General Surgery | Admitting: General Surgery

## 2020-05-15 ENCOUNTER — Encounter: Payer: Self-pay | Admitting: Family Medicine

## 2020-05-15 ENCOUNTER — Other Ambulatory Visit: Payer: Self-pay

## 2020-05-15 VITALS — BP 124/60 | HR 72 | Temp 98.0°F | Resp 14 | Ht 61.0 in | Wt 162.0 lb

## 2020-05-15 DIAGNOSIS — F410 Panic disorder [episodic paroxysmal anxiety] without agoraphobia: Secondary | ICD-10-CM | POA: Diagnosis not present

## 2020-05-15 DIAGNOSIS — Z20822 Contact with and (suspected) exposure to covid-19: Secondary | ICD-10-CM | POA: Diagnosis not present

## 2020-05-15 DIAGNOSIS — K5904 Chronic idiopathic constipation: Secondary | ICD-10-CM

## 2020-05-15 DIAGNOSIS — Z0001 Encounter for general adult medical examination with abnormal findings: Secondary | ICD-10-CM | POA: Diagnosis not present

## 2020-05-15 DIAGNOSIS — E785 Hyperlipidemia, unspecified: Secondary | ICD-10-CM | POA: Diagnosis not present

## 2020-05-15 DIAGNOSIS — E6609 Other obesity due to excess calories: Secondary | ICD-10-CM

## 2020-05-15 DIAGNOSIS — E559 Vitamin D deficiency, unspecified: Secondary | ICD-10-CM | POA: Diagnosis not present

## 2020-05-15 DIAGNOSIS — R7309 Other abnormal glucose: Secondary | ICD-10-CM | POA: Diagnosis not present

## 2020-05-15 DIAGNOSIS — Z780 Asymptomatic menopausal state: Secondary | ICD-10-CM | POA: Diagnosis not present

## 2020-05-15 DIAGNOSIS — H906 Mixed conductive and sensorineural hearing loss, bilateral: Secondary | ICD-10-CM

## 2020-05-15 DIAGNOSIS — I10 Essential (primary) hypertension: Secondary | ICD-10-CM

## 2020-05-15 DIAGNOSIS — Z6830 Body mass index (BMI) 30.0-30.9, adult: Secondary | ICD-10-CM

## 2020-05-15 DIAGNOSIS — F4310 Post-traumatic stress disorder, unspecified: Secondary | ICD-10-CM | POA: Diagnosis not present

## 2020-05-15 DIAGNOSIS — Z7189 Other specified counseling: Secondary | ICD-10-CM | POA: Diagnosis not present

## 2020-05-15 DIAGNOSIS — F320 Major depressive disorder, single episode, mild: Secondary | ICD-10-CM | POA: Diagnosis not present

## 2020-05-15 DIAGNOSIS — Z01812 Encounter for preprocedural laboratory examination: Secondary | ICD-10-CM | POA: Insufficient documentation

## 2020-05-15 DIAGNOSIS — Z0000 Encounter for general adult medical examination without abnormal findings: Secondary | ICD-10-CM

## 2020-05-15 LAB — SARS CORONAVIRUS 2 (TAT 6-24 HRS): SARS Coronavirus 2: NEGATIVE

## 2020-05-15 MED ORDER — POLYETHYLENE GLYCOL 3350 17 GM/SCOOP PO POWD: 17.0000 g | Freq: Two times a day (BID) | ORAL | 1 refills | Status: AC | PRN

## 2020-05-15 NOTE — Progress Notes (Addendum)
Subjective:   Patient presents for Medicare Annual/Subsequent preventive examination.   Review Past Medical/Family/Social: Per EMR     HTN- taking Bp meds  Taking 10mg  of benezapril , no SE from meds, no CHEST pain, no SOB  Hyperlipidemia- taking pravastatin 80mg  once a day   MDD, continues on paxil, xanax 1mg  BID for anxiety, feels   Last visit referred back to GI for colonoscopy , she had 5 pre cancerous, she has scope now every 3 years   she is having surgery by Dr. Marcello Moores for Anal Polyp    Risk Factors  Current exercise habits: exercises reguilary  Dietary issues discussed:   Cardiac risk factors: Obesity (BMI >= 30 kg/m2).   Depression Screen  (Note: if answer to either of the following is "Yes", a more complete depression screening is indicated)  Over the past two weeks, have you felt down, depressed or hopeless? No Over the past two weeks, have you felt little interest or pleasure in doing things? No Have you lost interest or pleasure in daily life? No Do you often feel hopeless? No Do you cry easily over simple problems? No   Activities of Daily Living  In your present state of health, do you have any difficulty performing the following activities?:  Driving? No  Managing money? No  Feeding yourself? No  Getting from bed to chair? No  Climbing a flight of stairs? No  Preparing food and eating?: No  Bathing or showering? No  Getting dressed: No  Getting to the toilet? No  Using the toilet:No  Moving around from place to place: No  In the past year have you fallen or had a near fall?:No     Hearing Difficulties: kNOWN HEARING LOSS ,Does not hearing aides ,  Do you often ask people to speak up or repeat themselves? No  Do you experience ringing or noises in your ears? No Do you have difficulty understanding soft or whispered voices? Yes  Do you feel that you have a problem with memory? No Do you often misplace items? No  Do you feel safe at home?  Yes  Cognitive Testing  Alert? Yes Normal Appearance?Yes  Oriented to person? Yes Place? Yes  Time? Yes  Recall of three objects? Yes  Can perform simple calculations? Yes  Displays appropriate judgment?Yes  Can read the correct time from a watch face?Yes   List the Names of Other Physician/Practitioners you currently use:  GI General surgery    Screening Tests / Date Colonoscopy             UTD         Zostavax  UTD  Mammogram   UTD , scheduled for Sept  Influenza Vaccine  Bone Density- DUE  Tetanus/tdap UTD  COVID-19 - DUE   HEP C screen negative   ROS  GEN- denies fatigue, fever, weight loss,weakness, recent illness HEENT- denies eye drainage, change in vision, nasal discharge, CVS- denies chest pain, palpitations RESP- denies SOB, cough, wheeze ABD- denies N/V, change in stools, abd pain GU- denies dysuria, hematuria, dribbling, incontinence MSK- denies joint pain, muscle aches, injury Neuro- denies headache, dizziness, syncope, seizure activity  PHYSICAL: vitals reviewed  GEN- NAD, alert and oriented x3 HEENT- PERRL, EOMI, non injected sclera, pink conjunctiva, MMM, oropharynx clear Neck- Supple, no thryomegaly, no bruit  CVS- RRR, 2/6 SEM  RESP-CTAB ABD- NABS,soft,NT,ND  Psych- normal affect and mood- PHQ score 5  EXT- No edema Pulses- Radial, DP- 2+   Assessment:  Annual wellness medicare exam   Plan:    During the course of the visit the patient was educated and counseled about appropriate screening and preventive services including:   Immunizations:  discussed COVID-19 virus and recommendation for immunization.  She is decided that she will proceed with immunization  Otherwise UTD, will get flu in the next few weeks as well  HTN- controlled no changes  Hyperlipidemia- continue statin drug, check LFT  Osteoporosis screening- post menopausal and vitamin D def , pt will get done with her mammogram   MDD/ Panic attacks/Anxiety- controlled on  current regimen no changes   FALL/AUDIT C negative    Discussed advanced directives handout given   Chronic constipation- discussed trying miralax to keep bowels softer  F/U 6 months            Diet review for nutrition referral? Yes ____ Not Indicated __x__  Patient Instructions (the written plan) was given to the patient.  Medicare Attestation  I have personally reviewed:  The patient's medical and social history  Their use of alcohol, tobacco or illicit drugs  Their current medications and supplements  The patient's functional ability including ADLs,fall risks, home safety risks, cognitive, and hearing and visual impairment  Diet and physical activities  Evidence for depression or mood disorders  The patient's weight, height, BMI, and visual acuity have been recorded in the chart. I have made referrals, counseling, and provided education to the patient based on review of the above and I have provided the patient with a written personalized care plan for preventive services.

## 2020-05-15 NOTE — Patient Instructions (Addendum)
F/U 6 months Schedule your bone density  miralax once a day

## 2020-05-18 ENCOUNTER — Other Ambulatory Visit: Payer: Self-pay

## 2020-05-18 ENCOUNTER — Ambulatory Visit (HOSPITAL_BASED_OUTPATIENT_CLINIC_OR_DEPARTMENT_OTHER): Payer: Medicare Other | Admitting: Certified Registered"

## 2020-05-18 ENCOUNTER — Encounter (HOSPITAL_BASED_OUTPATIENT_CLINIC_OR_DEPARTMENT_OTHER)
Admission: RE | Disposition: A | Discharge status: Home / Self Care | Payer: Self-pay | Source: Home / Self Care | Attending: General Surgery

## 2020-05-18 ENCOUNTER — Ambulatory Visit (HOSPITAL_BASED_OUTPATIENT_CLINIC_OR_DEPARTMENT_OTHER)
Admission: RE | Admit: 2020-05-18 | Discharge: 2020-05-18 | Disposition: A | Discharge status: Home / Self Care | Payer: Medicare Other | Attending: General Surgery | Admitting: General Surgery

## 2020-05-18 ENCOUNTER — Encounter (HOSPITAL_BASED_OUTPATIENT_CLINIC_OR_DEPARTMENT_OTHER): Payer: Self-pay | Admitting: General Surgery

## 2020-05-18 DIAGNOSIS — G4733 Obstructive sleep apnea (adult) (pediatric): Secondary | ICD-10-CM | POA: Diagnosis not present

## 2020-05-18 DIAGNOSIS — F4310 Post-traumatic stress disorder, unspecified: Secondary | ICD-10-CM | POA: Diagnosis not present

## 2020-05-18 DIAGNOSIS — Z79899 Other long term (current) drug therapy: Secondary | ICD-10-CM | POA: Insufficient documentation

## 2020-05-18 DIAGNOSIS — Z6830 Body mass index (BMI) 30.0-30.9, adult: Secondary | ICD-10-CM | POA: Diagnosis not present

## 2020-05-18 DIAGNOSIS — E785 Hyperlipidemia, unspecified: Secondary | ICD-10-CM | POA: Insufficient documentation

## 2020-05-18 DIAGNOSIS — I739 Peripheral vascular disease, unspecified: Secondary | ICD-10-CM | POA: Insufficient documentation

## 2020-05-18 DIAGNOSIS — E559 Vitamin D deficiency, unspecified: Secondary | ICD-10-CM | POA: Insufficient documentation

## 2020-05-18 DIAGNOSIS — E669 Obesity, unspecified: Secondary | ICD-10-CM | POA: Insufficient documentation

## 2020-05-18 DIAGNOSIS — M1990 Unspecified osteoarthritis, unspecified site: Secondary | ICD-10-CM | POA: Diagnosis not present

## 2020-05-18 DIAGNOSIS — I10 Essential (primary) hypertension: Secondary | ICD-10-CM | POA: Diagnosis not present

## 2020-05-18 DIAGNOSIS — K644 Residual hemorrhoidal skin tags: Secondary | ICD-10-CM | POA: Diagnosis not present

## 2020-05-18 DIAGNOSIS — K219 Gastro-esophageal reflux disease without esophagitis: Secondary | ICD-10-CM | POA: Insufficient documentation

## 2020-05-18 DIAGNOSIS — G8929 Other chronic pain: Secondary | ICD-10-CM | POA: Insufficient documentation

## 2020-05-18 DIAGNOSIS — M549 Dorsalgia, unspecified: Secondary | ICD-10-CM | POA: Diagnosis not present

## 2020-05-18 DIAGNOSIS — K620 Anal polyp: Secondary | ICD-10-CM | POA: Diagnosis not present

## 2020-05-18 DIAGNOSIS — F419 Anxiety disorder, unspecified: Secondary | ICD-10-CM | POA: Insufficient documentation

## 2020-05-18 DIAGNOSIS — K449 Diaphragmatic hernia without obstruction or gangrene: Secondary | ICD-10-CM | POA: Diagnosis not present

## 2020-05-18 DIAGNOSIS — E7800 Pure hypercholesterolemia, unspecified: Secondary | ICD-10-CM | POA: Diagnosis not present

## 2020-05-18 DIAGNOSIS — F329 Major depressive disorder, single episode, unspecified: Secondary | ICD-10-CM | POA: Insufficient documentation

## 2020-05-18 DIAGNOSIS — Z87891 Personal history of nicotine dependence: Secondary | ICD-10-CM | POA: Diagnosis not present

## 2020-05-18 HISTORY — DX: Migraine, unspecified, not intractable, without status migrainosus: G43.909

## 2020-05-18 HISTORY — PX: MASS EXCISION: SHX2000

## 2020-05-18 LAB — POCT I-STAT, CHEM 8
BUN: 27 mg/dL — ABNORMAL HIGH (ref 8–23)
Calcium, Ion: 1.23 mmol/L (ref 1.15–1.40)
Chloride: 102 mmol/L (ref 98–111)
Creatinine, Ser: 0.7 mg/dL (ref 0.44–1.00)
Glucose, Bld: 107 mg/dL — ABNORMAL HIGH (ref 70–99)
HCT: 40 % (ref 36.0–46.0)
Hemoglobin: 13.6 g/dL (ref 12.0–15.0)
Potassium: 4 mmol/L (ref 3.5–5.1)
Sodium: 143 mmol/L (ref 135–145)
TCO2: 29 mmol/L (ref 22–32)

## 2020-05-18 SURGERY — EXCISION MASS
Anesthesia: Monitor Anesthesia Care

## 2020-05-18 MED ORDER — BUPIVACAINE-EPINEPHRINE 0.5% -1:200000 IJ SOLN
INTRAMUSCULAR | Status: DC | PRN
  Administered 2020-05-18: 30 mL

## 2020-05-18 MED ORDER — TRAMADOL HCL 50 MG PO TABS: 50.0000 mg | ORAL_TABLET | Freq: Four times a day (QID) | ORAL | 0 refills | Status: DC | PRN

## 2020-05-18 MED ORDER — FENTANYL CITRATE (PF) 100 MCG/2ML IJ SOLN
INTRAMUSCULAR | Status: AC
Start: 2020-05-18 — End: ?
  Filled 2020-05-18: qty 2

## 2020-05-18 MED ORDER — PROPOFOL 500 MG/50ML IV EMUL
INTRAVENOUS | Status: DC | PRN
  Administered 2020-05-18: 200 ug/kg/min via INTRAVENOUS

## 2020-05-18 MED ORDER — FENTANYL CITRATE (PF) 100 MCG/2ML IJ SOLN
25.0000 ug | INTRAMUSCULAR | Status: DC | PRN
  Administered 2020-05-18: 25 ug via INTRAVENOUS

## 2020-05-18 MED ORDER — ACETAMINOPHEN 325 MG RE SUPP: 650.0000 mg | RECTAL | Status: DC | PRN

## 2020-05-18 MED ORDER — PROPOFOL 10 MG/ML IV BOLUS
INTRAVENOUS | Status: DC | PRN
  Administered 2020-05-18: 30 mg via INTRAVENOUS

## 2020-05-18 MED ORDER — LIDOCAINE 2% (20 MG/ML) 5 ML SYRINGE
INTRAMUSCULAR | Status: DC | PRN
  Administered 2020-05-18: 40 mg via INTRAVENOUS

## 2020-05-18 MED ORDER — ACETAMINOPHEN 325 MG PO TABS: 650.0000 mg | ORAL_TABLET | ORAL | Status: DC | PRN

## 2020-05-18 MED ORDER — OXYCODONE HCL 5 MG PO TABS: 5.0000 mg | ORAL_TABLET | ORAL | Status: DC | PRN

## 2020-05-18 MED ORDER — PROPOFOL 500 MG/50ML IV EMUL
INTRAVENOUS | Status: AC
  Filled 2020-05-18: qty 50

## 2020-05-18 MED ORDER — LIDOCAINE 2% (20 MG/ML) 5 ML SYRINGE
INTRAMUSCULAR | Status: AC
  Filled 2020-05-18: qty 5

## 2020-05-18 MED ORDER — 0.9 % SODIUM CHLORIDE (POUR BTL) OPTIME
TOPICAL | Status: DC | PRN
  Administered 2020-05-18: 500 mL

## 2020-05-18 MED ORDER — SODIUM CHLORIDE 0.9% FLUSH: 3.0000 mL | Freq: Two times a day (BID) | INTRAVENOUS | Status: DC

## 2020-05-18 MED ORDER — SODIUM CHLORIDE 0.9% FLUSH: 3.0000 mL | INTRAVENOUS | Status: DC | PRN

## 2020-05-18 MED ORDER — LACTATED RINGERS IV SOLN: INTRAVENOUS | Status: DC

## 2020-05-18 MED ORDER — SODIUM CHLORIDE 0.9 % IV SOLN: 250.0000 mL | INTRAVENOUS | Status: DC | PRN

## 2020-05-18 SURGICAL SUPPLY — 51 items
ADH SKN CLS APL DERMABOND .7 (GAUZE/BANDAGES/DRESSINGS)
APL PRP STRL LF DISP 70% ISPRP (MISCELLANEOUS) ×1
APL SKNCLS STERI-STRIP NONHPOA (GAUZE/BANDAGES/DRESSINGS)
BENZOIN TINCTURE PRP APPL 2/3 (GAUZE/BANDAGES/DRESSINGS) IMPLANT
BLADE CLIPPER SENSICLIP SURGIC (BLADE) IMPLANT
BLADE EXTENDED COATED 6.5IN (ELECTRODE) IMPLANT
BLADE SURG 10 STRL SS (BLADE) ×3 IMPLANT
CHLORAPREP W/TINT 26 (MISCELLANEOUS) ×3 IMPLANT
CLOSURE WOUND 1/2 X4 (GAUZE/BANDAGES/DRESSINGS)
COVER BACK TABLE 60X90IN (DRAPES) ×3 IMPLANT
COVER MAYO STAND STRL (DRAPES) ×3 IMPLANT
COVER WAND RF STERILE (DRAPES) ×3 IMPLANT
DECANTER SPIKE VIAL GLASS SM (MISCELLANEOUS) IMPLANT
DERMABOND ADVANCED (GAUZE/BANDAGES/DRESSINGS)
DERMABOND ADVANCED .7 DNX12 (GAUZE/BANDAGES/DRESSINGS) IMPLANT
DRAPE LAPAROTOMY 100X72 PEDS (DRAPES) ×3 IMPLANT
DRAPE UTILITY XL STRL (DRAPES) ×3 IMPLANT
DRSG PAD ABDOMINAL 8X10 ST (GAUZE/BANDAGES/DRESSINGS) ×3 IMPLANT
DRSG TEGADERM 4X4.75 (GAUZE/BANDAGES/DRESSINGS) IMPLANT
ELECT REM PT RETURN 9FT ADLT (ELECTROSURGICAL) ×3
ELECTRODE REM PT RTRN 9FT ADLT (ELECTROSURGICAL) ×1 IMPLANT
GAUZE SPONGE 4X4 12PLY STRL (GAUZE/BANDAGES/DRESSINGS) ×3 IMPLANT
GLOVE BIO SURGEON STRL SZ 6.5 (GLOVE) ×4 IMPLANT
GLOVE BIO SURGEONS STRL SZ 6.5 (GLOVE) ×2
GLOVE BIOGEL PI IND STRL 7.0 (GLOVE) ×1 IMPLANT
GLOVE BIOGEL PI INDICATOR 7.0 (GLOVE) ×2
KIT TURNOVER CYSTO (KITS) ×3 IMPLANT
NEEDLE HYPO 22GX1.5 SAFETY (NEEDLE) ×3 IMPLANT
NS IRRIG 500ML POUR BTL (IV SOLUTION) IMPLANT
PACK BASIN DAY SURGERY FS (CUSTOM PROCEDURE TRAY) ×3 IMPLANT
PAD ARMBOARD 7.5X6 YLW CONV (MISCELLANEOUS) IMPLANT
PENCIL SMOKE EVACUATOR (MISCELLANEOUS) ×3 IMPLANT
STRIP CLOSURE SKIN 1/2X4 (GAUZE/BANDAGES/DRESSINGS) IMPLANT
SUT ETHILON 2 0 FS 18 (SUTURE) IMPLANT
SUT ETHILON 4 0 PS 2 18 (SUTURE) IMPLANT
SUT SILK 2 0 SH (SUTURE) IMPLANT
SUT VIC AB 2-0 SH 27 (SUTURE)
SUT VIC AB 2-0 SH 27XBRD (SUTURE) IMPLANT
SUT VIC AB 3-0 SH 18 (SUTURE) IMPLANT
SUT VIC AB 4-0 PS2 18 (SUTURE) IMPLANT
SUT VIC AB 4-0 SH 18 (SUTURE) IMPLANT
SWAB CULTURE ESWAB REG 1ML (MISCELLANEOUS) IMPLANT
SYR BULB IRRIG 60ML STRL (SYRINGE) ×3 IMPLANT
SYR CONTROL 10ML LL (SYRINGE) ×3 IMPLANT
TOWEL OR 17X26 10 PK STRL BLUE (TOWEL DISPOSABLE) ×6 IMPLANT
TRAY DSU PREP LF (CUSTOM PROCEDURE TRAY) IMPLANT
TUBE CONNECTING 12'X1/4 (SUCTIONS) ×1
TUBE CONNECTING 12X1/4 (SUCTIONS) ×2 IMPLANT
UNDERPAD 30X36 HEAVY ABSORB (UNDERPADS AND DIAPERS) IMPLANT
WATER STERILE IRR 500ML POUR (IV SOLUTION) ×3 IMPLANT
YANKAUER SUCT BULB TIP NO VENT (SUCTIONS) ×3 IMPLANT

## 2020-05-18 NOTE — Anesthesia Postprocedure Evaluation (Signed)
Anesthesia Post Note  Patient: Lynn [name]  Procedure(s) Performed: EXCISION of anal polyp (N/A )     Patient location during evaluation: PACU Anesthesia Type: MAC Level of consciousness: awake and alert Pain management: pain level controlled Vital Signs Assessment: post-procedure vital signs reviewed and stable Respiratory status: spontaneous breathing, nonlabored ventilation, respiratory function stable and patient connected to nasal cannula oxygen Cardiovascular status: stable and blood pressure returned to baseline Postop Assessment: no apparent nausea or vomiting Anesthetic complications: no   No complications documented.  Last Vitals:  Vitals:   05/18/20 0915 05/18/20 1000  BP: (!) 120/97 (!) 161/71  Pulse: 64 (!) 54  Resp: 20 12  Temp:  36.8 C  SpO2: 92% 96%    Last Pain:  Vitals:   05/18/20 0945  TempSrc:   PainSc: 0-No pain                 Donnarae Rae L Dorma Altman

## 2020-05-18 NOTE — Transfer of Care (Signed)
Immediate Anesthesia Transfer of Care Note  Patient: Lynn [name]  Procedure(s) Performed: Procedure(s) (LRB): EXCISION of anal polyp (N/A)  Patient Location: PACU  Anesthesia Type: MAC  Level of Consciousness: awake, alert , oriented and patient cooperative  Airway & Oxygen Therapy: Patient Spontanous Breathing and Patient connected to face mask oxygen  Post-op Assessment: Report given to PACU RN and Post -op Vital signs reviewed and stable  Post vital signs: Reviewed and stable  Complications: No apparent anesthesia complications Last Vitals:  Vitals Value Taken Time  BP 128/60 05/18/20 0842  Temp 36.3 C 05/18/20 0842  Pulse 72 05/18/20 0844  Resp 20 05/18/20 0844  SpO2 96 % 05/18/20 0844  Vitals shown include unvalidated device data.  Last Pain:  Vitals:   05/18/20 0842  TempSrc:   PainSc: 0-No pain      Patients Stated Pain Goal: 4 (15/52/08 0223)  Complications: No complications documented.

## 2020-05-18 NOTE — Discharge Instructions (Addendum)

## 2020-05-18 NOTE — Anesthesia Preprocedure Evaluation (Addendum)
Anesthesia Evaluation  Patient identified by MRN, date of birth, ID band Patient awake    Reviewed: Allergy & Precautions, NPO status , Patient's Chart, lab work & pertinent test results  Airway Mallampati: II  TM Distance: >3 FB Neck ROM: Full    Dental  (+) Edentulous Upper, Missing, Dental Advisory Given,    Pulmonary sleep apnea , former smoker,    Pulmonary exam normal breath sounds clear to auscultation       Cardiovascular hypertension, + Peripheral Vascular Disease  Normal cardiovascular exam Rhythm:Regular Rate:Normal  HLD  TTE 2019 - Left ventricle: The cavity size was normal. Wall thickness was normal. Systolic function was normal. The estimated ejection fraction was in the range of 60% to 65%. Wall motion was normal;there were no regional wall motion abnormalities. Left ventricular diastolic function parameters were normal for the patient&'s age.  - Aortic valve: Moderately calcified annulus. Probably trileaflet; moderately calcified leaflets. There was mild stenosis. Mean gradient (S): 13 mm Hg. Peak gradient (S): 24 mm Hg. VTI ratio of LVOT to aortic valve: 0.47. Valve area (VTI): 1.47 cm^2. Valve area (Vmax): 1.47 cm^2.  - Mitral valve: Mildly calcified annulus. There was trivial regurgitation.  - Left atrium: The atrium was mildly dilated.  - Right atrium: Central venous pressure (est): 3 mm Hg.  - Atrial septum: No defect or patent foramen ovale was identified.  - Tricuspid valve: There was trivial regurgitation.  - Pulmonary arteries: Systolic pressure could not be accurately estimated.  - Pericardium, extracardiac: There was no pericardial effusion.   Neuro/Psych  Headaches, PSYCHIATRIC DISORDERS Anxiety Depression    GI/Hepatic Neg liver ROS, hiatal hernia, GERD  Medicated and Controlled,  Endo/Other  negative endocrine ROS  Renal/GU negative Renal ROS  negative genitourinary   Musculoskeletal  (+)  Arthritis ,   Abdominal   Peds  Hematology negative hematology ROS (+)   Anesthesia Other Findings   Reproductive/Obstetrics                            Anesthesia Physical Anesthesia Plan  ASA: III  Anesthesia Plan: MAC   Post-op Pain Management:    Induction: Intravenous  PONV Risk Score and Plan: 2 and Propofol infusion, Treatment may vary due to age or medical condition and Midazolam  Airway Management Planned: Natural Airway  Additional Equipment:   Intra-op Plan:   Post-operative Plan:   Informed Consent: I have reviewed the patients History and Physical, chart, labs and discussed the procedure including the risks, benefits and alternatives for the proposed anesthesia with the patient or authorized representative who has indicated his/her understanding and acceptance.     Dental advisory given  Plan Discussed with: CRNA  Anesthesia Plan Comments:         Anesthesia Quick Evaluation

## 2020-05-18 NOTE — H&P (Signed)
The patient is a 69 year old female who presents with anal lesions. 69 year old female who presents to the office for evaluation of an anal nodule seen on recent colonoscopy. Patient states that she has noticed this for approximately 10 years. She thought it was a hemorrhoid. It has slowly enlarged over that time. It is asymptomatic.   Problem List/Past Medical Leighton Ruff, MD; 3/41/9622 9:59 AM) [name] HERNIA, WITHOUT OBSTRUCTION OR GANGRENE (K43.2)  Diagnostic Studies History Leighton Ruff, MD; 2/97/9892 9:59 AM) Colonoscopy 5-10 years ago Mammogram within last year Pap Smear 1-5 years ago  Allergies Darden Palmer, RMA; 04/10/2020 9:57 AM) Diclofenac *ANALGESICS - ANTI-INFLAMMATORY* Hives Hydrocodone-Acetaminophen *ANALGESICS - OPIOID* itching, nausea, vomiting Lipitor *ANTIHYPERLIPIDEMICS* Itching Niacin (Antihyperlipidemic) *ANTIHYPERLIPIDEMICS* Rash Zolpidem Tartrate *HYPNOTICS/SEDATIVES/SLEEP DISORDER AGENTS* Hives Allergies Reconciled  Medication History Darden Palmer, RMA; 04/10/2020 9:57 AM) Cyclobenzaprine HCl (10MG  Tablet, Oral as needed) Active. NexIUM (40MG  Capsule DR, Oral daily) Active. ALPRAZolam (1MG  Tablet, Oral daily) Active. AmLODIPine Besylate (10MG  Tablet, Oral daily) Active. Benazepril HCl (20MG  Tablet, Oral daily) Active. PARoxetine HCl (40MG  Tablet, Oral daily) Active. Pravastatin Sodium (80MG  Tablet, Oral daily) Active. Vitamin D (Cholecalciferol) (1000UNIT Capsule, Oral daily) Active. Dexilant (60MG  Capsule DR, Oral) Active. Famotidine (20MG  Tablet, Oral) Active. Medications Reconciled  Social History Leighton Ruff, MD; 10/04/4172 9:59 AM) Alcohol use Remotely quit alcohol use. Caffeine use Carbonated beverages, Coffee. No drug use Tobacco use Former smoker.  Family History Leighton Ruff, MD; 0/81/4481 9:59 AM) Arthritis Mother. Breast Cancer Mother. Colon Polyps Mother, Sister. Depression  Mother. Diabetes Mellitus Mother, Sister. Heart Disease Father, Mother, Sister. Heart disease in female family member before age 67 Hypertension Brother, Mother, Sister. Kidney Disease Mother, Sister.  Pregnancy / Birth History Leighton Ruff, MD; 8/56/3149 9:59 AM) Age at menarche 15 years. Age of menopause 25-55 Gravida 3 Irregular periods Maternal age 49-20 Para 2  Other Problems Leighton Ruff, MD; 03/17/6377 9:59 AM) Anxiety Disorder Arthritis Back Pain Gastric Ulcer Gastroesophageal Reflux Disease Hemorrhoids High blood pressure Hypercholesterolemia     Review of Systems  General Not Present- Appetite Loss, Chills, Fatigue, Fever, Night Sweats, Weight Gain and Weight Loss. Skin Not Present- Change in Wart/Mole, Dryness, Hives, Jaundice, New Lesions, Non-Healing Wounds, Rash and Ulcer. HEENT Present- Ringing in the Ears. Not Present- Earache, Hearing Loss, Hoarseness, Nose Bleed, Oral Ulcers, Seasonal Allergies, Sinus Pain, Sore Throat, Visual Disturbances, Wears glasses/contact lenses and Yellow Eyes. Respiratory Present- Snoring. Not Present- Bloody sputum, Chronic Cough, Difficulty Breathing and Wheezing. Breast Not Present- Breast Mass, Breast Pain, Nipple Discharge and Skin Changes. Cardiovascular Present- Leg Cramps. Not Present- Chest Pain, Difficulty Breathing Lying Down, Palpitations, Rapid Heart Rate, Shortness of Breath and Swelling of Extremities. Gastrointestinal Present- Abdominal Pain, Bloating, Difficulty Swallowing, Hemorrhoids and Nausea. Not Present- Bloody Stool, Change in Bowel Habits, Chronic diarrhea, Constipation, Excessive gas, Gets full quickly at meals, Indigestion, Rectal Pain and Vomiting. Female Genitourinary Not Present- Frequency, Nocturia, Painful Urination, Pelvic Pain and Urgency. Musculoskeletal Present- Back Pain, Joint Pain, Joint Stiffness, Muscle Pain, Muscle Weakness and Swelling of Extremities. Neurological Not  Present- Decreased Memory, Fainting, Headaches, Numbness, Seizures, Tingling, Tremor, Trouble walking and Weakness. Psychiatric Present- Anxiety and Depression. Not Present- Bipolar, Change in Sleep Pattern, Fearful and Frequent crying. Endocrine Present- Heat Intolerance and Hot flashes. Not Present- Cold Intolerance, Excessive Hunger, Hair Changes and New Diabetes. Hematology Not Present- Blood Thinners, Easy Bruising, Excessive bleeding, Gland problems, HIV and Persistent Infections. All other systems negative  BP (!) 143/82   Pulse 62   Temp 97.9 F (36.6  C) (Oral)   Resp 15   Ht 5\' 1"  (1.549 m)   Wt 73.9 kg   SpO2 96%   BMI 30.80 kg/m     Physical Exam   General Mental Status-Alert. General Appearance-Cooperative.  Rectal Anorectal Exam External - Note: Polypoid mass arising from a stalk on the right posterior lateral perianal region. Internal - normal sphincter tone.    Assessment & Plan   POLYP OF ANAL VERGE (K62.0) Impression: 69 year old female who presents to the office for evaluation of an anal polyp seen on most recent colonoscopy. 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 I recommended excision to evaluate this completely pathologically into help with anal hygiene. We discussed the procedure in detail including postoperative pain, and healing time. We will get this scheduled at her convenience. All questions were answered.

## 2020-05-18 NOTE — Op Note (Signed)
05/18/2020  8:35 AM  PATIENT:  Lynn [name]  69 y.o. female  Patient Care Team: Buelah Manis, Modena Nunnery, MD as PCP - General (Family Medicine) Edythe Clarity, [facility] as Pharmacist (Pharmacist)  PRE-OPERATIVE DIAGNOSIS:  anal polyp  POST-OPERATIVE DIAGNOSIS:  anal polyp  PROCEDURE:  EXCISION of anal polyp   Surgeon(s): Leighton Ruff, MD  ASSISTANT: none   ANESTHESIA:   local and MAC  SPECIMEN:  Source of Specimen:  anal polyp  DISPOSITION OF SPECIMEN:  PATHOLOGY  COUNTS:  YES  PLAN OF CARE: Discharge to home after PACU  PATIENT DISPOSITION:  PACU - hemodynamically stable.  INDICATION: 69 y.o. F with pedunculated anal mass   OR FINDINGS: Probable fibroepithelial polyp arising from R posterior perianal region  DESCRIPTION: the patient was identified in the preoperative holding area and taken to the OR where they were laid on the operating room table.  MAC anesthesia was induced without difficulty. The patient was then positioned in prone jackknife position with buttocks gently taped apart.  The patient was then prepped and draped in usual sterile fashion.  SCDs were noted to be in place prior to the initiation of anesthesia. A surgical timeout was performed indicating the correct patient, procedure, positioning and need for preoperative antibiotics.  A rectal block was performed using Marcaine with epinephrine.    I began with a digital rectal exam.  The mass was palpated and brought out of the anal canal.  I then gently dilated the anal canal to approximately 2 fingerbreadths.  I then placed a Hill-Ferguson anoscope into the anal canal and evaluated this completely.  There were no other lesions noted.  I excised the polyp from the right posterior perianal region using Metzenbaum scissors.  I then closed the excision site using a running 2-0 chromic suture.  Hemostasis was good.  Lidocaine ointment and dressing was applied.  The patient was awakened from anesthesia and sent to the  postanesthesia care unit in stable condition.  All counts were correct per operating room staff.

## 2020-05-19 ENCOUNTER — Encounter (HOSPITAL_BASED_OUTPATIENT_CLINIC_OR_DEPARTMENT_OTHER): Payer: Self-pay | Admitting: General Surgery

## 2020-05-19 LAB — SURGICAL PATHOLOGY

## 2020-05-20 LAB — CBC WITH DIFFERENTIAL/PLATELET
Absolute Monocytes: 527 cells/uL (ref 200–950)
Basophils Absolute: 50 cells/uL (ref 0–200)
Basophils Relative: 0.8 %
Eosinophils Absolute: 248 cells/uL (ref 15–500)
Eosinophils Relative: 4 %
HCT: 42.2 % (ref 35.0–45.0)
Hemoglobin: 13.6 g/dL (ref 11.7–15.5)
Lymphs Abs: 2207 cells/uL (ref 850–3900)
MCH: 29.4 pg (ref 27.0–33.0)
MCHC: 32.2 g/dL (ref 32.0–36.0)
MCV: 91.1 fL (ref 80.0–100.0)
MPV: 12.2 fL (ref 7.5–12.5)
Monocytes Relative: 8.5 %
Neutro Abs: 3168 cells/uL (ref 1500–7800)
Neutrophils Relative %: 51.1 %
Platelets: 206 10*3/uL (ref 140–400)
RBC: 4.63 10*6/uL (ref 3.80–5.10)
RDW: 12.1 % (ref 11.0–15.0)
Total Lymphocyte: 35.6 %
WBC: 6.2 10*3/uL (ref 3.8–10.8)

## 2020-05-20 LAB — VITAMIN D 25 HYDROXY (VIT D DEFICIENCY, FRACTURES): Vit D, 25-Hydroxy: 38 ng/mL (ref 30–100)

## 2020-05-20 LAB — COMPREHENSIVE METABOLIC PANEL
AG Ratio: 1.8 (calc) (ref 1.0–2.5)
ALT: 8 U/L (ref 6–29)
AST: 15 U/L (ref 10–35)
Albumin: 4.4 g/dL (ref 3.6–5.1)
Alkaline phosphatase (APISO): 72 U/L (ref 37–153)
BUN: 19 mg/dL (ref 7–25)
CO2: 31 mmol/L (ref 20–32)
Calcium: 9.3 mg/dL (ref 8.6–10.4)
Chloride: 106 mmol/L (ref 98–110)
Creat: 0.66 mg/dL (ref 0.50–0.99)
Globulin: 2.4 g/dL (calc) (ref 1.9–3.7)
Glucose, Bld: 114 mg/dL — ABNORMAL HIGH (ref 65–99)
Potassium: 4.1 mmol/L (ref 3.5–5.3)
Sodium: 143 mmol/L (ref 135–146)
Total Bilirubin: 0.5 mg/dL (ref 0.2–1.2)
Total Protein: 6.8 g/dL (ref 6.1–8.1)

## 2020-05-20 LAB — LIPID PANEL
Cholesterol: 174 mg/dL (ref <200)
HDL: 57 mg/dL (ref >=50)
LDL Cholesterol (Calc): 101 mg/dL (calc) — ABNORMAL HIGH
Non-HDL Cholesterol (Calc): 117 mg/dL (calc) (ref <130)
Total CHOL/HDL Ratio: 3.1 (calc) (ref <5.0)
Triglycerides: 69 mg/dL (ref <150)

## 2020-05-20 LAB — TEST AUTHORIZATION

## 2020-05-20 LAB — HEMOGLOBIN A1C W/OUT EAG: Hgb A1c MFr Bld: 5.6 % of total Hgb (ref <5.7)

## 2020-05-24 ENCOUNTER — Telehealth: Payer: Self-pay | Admitting: *Deleted

## 2020-05-24 NOTE — Telephone Encounter (Signed)
Received VM from patient.   Reports that she requires order for Dexa. Noted Dexa ordered in chart on 05/15/2020.  Call placed to patient to inquire. No answer. No VM.

## 2020-05-24 NOTE — Telephone Encounter (Signed)
Received fax from [facility].   Routed to PCP.

## 2020-05-25 ENCOUNTER — Other Ambulatory Visit: Payer: Self-pay | Admitting: *Deleted

## 2020-05-25 DIAGNOSIS — F320 Major depressive disorder, single episode, mild: Secondary | ICD-10-CM

## 2020-05-25 DIAGNOSIS — F410 Panic disorder [episodic paroxysmal anxiety] without agoraphobia: Secondary | ICD-10-CM

## 2020-05-25 DIAGNOSIS — F4310 Post-traumatic stress disorder, unspecified: Secondary | ICD-10-CM

## 2020-05-25 DIAGNOSIS — E559 Vitamin D deficiency, unspecified: Secondary | ICD-10-CM

## 2020-05-25 DIAGNOSIS — K5904 Chronic idiopathic constipation: Secondary | ICD-10-CM

## 2020-05-25 DIAGNOSIS — R110 Nausea: Secondary | ICD-10-CM

## 2020-05-25 DIAGNOSIS — K219 Gastro-esophageal reflux disease without esophagitis: Secondary | ICD-10-CM

## 2020-05-25 DIAGNOSIS — Z780 Asymptomatic menopausal state: Secondary | ICD-10-CM

## 2020-05-25 DIAGNOSIS — F411 Generalized anxiety disorder: Secondary | ICD-10-CM

## 2020-05-25 DIAGNOSIS — I10 Essential (primary) hypertension: Secondary | ICD-10-CM

## 2020-05-25 DIAGNOSIS — E6609 Other obesity due to excess calories: Secondary | ICD-10-CM

## 2020-05-25 DIAGNOSIS — H906 Mixed conductive and sensorineural hearing loss, bilateral: Secondary | ICD-10-CM

## 2020-05-26 NOTE — Chronic Care Management (AMB) (Deleted)
Chronic Care Management Pharmacy  Name: Lynn [name]  MRN: [id number] DOB: Sep 13, 1951  Chief Complaint/ HPI  Lynn [name],  69 y.o. , female presents for their Initial CCM visit with the clinical pharmacist In office.  PCP : [name], MD  Their chronic conditions include: HTN, GERD, HLD, Depression/anxiety.  Office Visits: 05/15/2020 [facility]) -   CPE no med changes  Miralax once daily for chronic constipation  Needs to schedule bone density  01/11/2020 [facility]) -   Patient reports dry heaving qam and feels nauseous for about 10 minutes then it stops  Referred to GI  Benazepril was decreased to 71m once daily Consult Visit:***  Medications: Outpatient Encounter Medications as of 05/31/2020  Medication Sig  . ALPRAZolam (XANAX) 1 MG tablet TAKE 1 TABLET BY MOUTH TWICE A DAY  . amLODipine (NORVASC) 10 MG tablet TAKE 1 TABLET BY MOUTH EVERY DAY  . benazepril (LOTENSIN) 20 MG tablet TAKE 1 TABLET BY MOUTH EVERY DAY (Patient taking differently: 10 mg. TAKE 1 TABLET BY MOUTH EVERY DAY)  . cholecalciferol (VITAMIN D) 1000 units tablet Take 2,000 Units by mouth 2 (two) times daily.  .Marland KitchenDEXILANT 60 MG capsule TAKE 1 CAPSULE BY MOUTH EVERY DAY (Patient taking differently: daily. )  . famotidine (PEPCID) 40 MG tablet Take 1 tablet (40 mg total) by mouth at bedtime.  . ondansetron (ZOFRAN) 4 MG tablet Take 1 tablet (4 mg total) by mouth every 8 (eight) hours as needed for nausea or vomiting.  .Marland KitchenPARoxetine (PAXIL) 40 MG tablet Take 1 tablet (40 mg total) by mouth daily. (Patient taking differently: Take 40 mg by mouth at bedtime. )  . polyethylene glycol powder (GLYCOLAX/MIRALAX) 17 GM/SCOOP powder Take 17 g by mouth 2 (two) times daily as needed.  . pravastatin (PRAVACHOL) 80 MG tablet Take 1 tablet (80 mg total) by mouth daily. (Patient taking differently: Take 80 mg by mouth at bedtime. )  . traMADol (ULTRAM) 50 MG tablet Take 1 tablet (50 mg total) by mouth every 6  (six) hours as needed.   No facility-administered encounter medications on file as of 05/31/2020.     Current Diagnosis/Assessment:  Goals Addressed   None     Hypertension   BP goal is:  {CHL HP UPSTREAM Pharmacist BP ranges:[phone]}  Office blood pressures are  BP Readings from Last 3 Encounters:  05/18/20 (!) 161/71  05/15/20 124/60  03/22/20 129/70   Patient checks BP at home {CHL HP BP Monitoring Frequency:[phone]} Patient home BP readings are ranging: ***  Patient has failed these meds in the past: *** Patient is currently {CHL Controlled/Uncontrolled:[phone]} on the following medications:  . ***  We discussed {CHL HP Upstream Pharmacy [phone]}  Plan  Continue {CHL HP Upstream Pharmacy [phone]}     Hyperlipidemia   LDL goal < ***  Lipid Panel     Component Value Date/Time   CHOL 174 05/15/2020 0921   TRIG 69 05/15/2020 0921   HDL 57 05/15/2020 0921   LDLCALC 101 (H) 05/15/2020 0921    Hepatic Function Latest Ref Rng & Units 05/15/2020 01/11/2020 08/18/2018  Total Protein 6.1 - 8.1 g/dL 6.8 7.2 7.2  Albumin 3.6 - 5.1 g/dL - - -  AST 10 - 35 U/L '15 19 17  ' ALT 6 - 29 U/L '8 11 9  ' Alk Phosphatase 33 - 130 U/L - - -  Total Bilirubin 0.2 - 1.2 mg/dL 0.5 0.7 0.6  Bilirubin, Direct 0.0 - 0.3 mg/dL - - -  The 10-year ASCVD risk score Mikey Bussing DC Brooke Bonito., et al., 2013) is: 11.9%   Values used to calculate the score:     Age: 25 years     Sex: Female     Is Non-Hispanic African American: No     Diabetic: No     Tobacco smoker: No     Systolic Blood Pressure: 301 mmHg     Is BP treated: Yes     HDL Cholesterol: 57 mg/dL     Total Cholesterol: 174 mg/dL   Patient has failed these meds in past: *** Patient is currently {CHL Controlled/Uncontrolled:7148571030} on the following medications:  . ***  We discussed:  {CHL HP Upstream Pharmacy discussion:[phone]}  Plan  Continue {CHL HP Upstream Pharmacy Plans:[phone]} GERD    Patient has failed these meds in past: *** Patient is currently {CHL Controlled/Uncontrolled:7148571030} on the following medications:  . ***  We discussed:  ***  Plan  Continue {CHL HP Upstream Pharmacy SWFUX:[id number]} Depression/Anxiety   Patient has failed these meds in past: *** Patient is currently {CHL Controlled/Uncontrolled:7148571030} on the following medications:  . ***  We discussed:  ***  Plan  Continue {CHL HP Upstream Pharmacy URKYH:[phone]} Vaccines   Reviewed and discussed patient's vaccination history.    Immunization History  Administered Date(s) Administered  . Fluad Quad(high Dose 65+) 06/25/2019  . Influenza Split 11/07/2012  . Influenza Whole 06/17/2007  . Influenza,inj,Quad PF,6+ Mos 10/09/2015, 10/14/2016, 10/27/2017, 08/18/2018  . Pneumococcal Conjugate-13 01/11/2020  . Pneumococcal Polysaccharide-23 11/07/2012  . Tdap 04/14/2014  . Zoster 01/18/2015    Plan  Recommended patient receive *** vaccine in *** office.  Medication Management   . Miscellaneous medications: *** . OTC's: *** . Patient currently uses *** pharmacy.  Phone #  [phone] *** . Patient reports using *** method to organize medications and promote adherence. . Patient reports/denies missed doses of medication ***   Beverly Milch, PharmD Clinical Pharmacist [place] [phone]

## 2020-05-29 ENCOUNTER — Other Ambulatory Visit: Payer: Self-pay | Admitting: Family Medicine

## 2020-05-30 ENCOUNTER — Telehealth: Payer: Self-pay | Admitting: Pharmacist

## 2020-05-30 NOTE — Telephone Encounter (Signed)
Requested Prescriptions   Pending Prescriptions Disp Refills  . ALPRAZolam (XANAX) 1 MG tablet [Pharmacy Med Name: ALPRAZOLAM 1 MG TABLET] 60 tablet 1    Sig: TAKE 1 TABLET BY MOUTH TWICE A DAY     Last OV 05/15/2020   Last written 04/04/2020

## 2020-05-30 NOTE — Progress Notes (Signed)
Chronic Care Management Pharmacy Assistant   Name: Lynn [name]  MRN: [id number] DOB: July 02, 1951  Reason for Encounter: Medication Review   Unable to reach patient after multiple attempts.  Patient Questions:  1.  Have you seen any other providers since your last visit? No  2.  Any changes in your medicines or health? No   Lynn [name],  69 y.o. , female has their Initial CCM visit with the clinical pharmacist In office 05/31/20.  PCP : [name], MD  Allergies:   Allergies  Allergen Reactions  . Diclofenac Sodium Anaphylaxis  . Hydrocodone Itching and Nausea And Vomiting  . Lipitor [Atorvastatin Calcium] Other (See Comments)    Increased LFT's, myalgias  . Niacin Other (See Comments)    Flushing  . Other Other (See Comments)    Allergic to metal states body rejects it  . Zolpidem Other (See Comments)    Severe headache    Medications: Outpatient Encounter Medications as of 05/30/2020  Medication Sig  . ALPRAZolam (XANAX) 1 MG tablet TAKE 1 TABLET BY MOUTH TWICE A DAY  . amLODipine (NORVASC) 10 MG tablet TAKE 1 TABLET BY MOUTH EVERY DAY  . benazepril (LOTENSIN) 20 MG tablet TAKE 1 TABLET BY MOUTH EVERY DAY (Patient taking differently: 10 mg. TAKE 1 TABLET BY MOUTH EVERY DAY)  . cholecalciferol (VITAMIN D) 1000 units tablet Take 2,000 Units by mouth 2 (two) times daily.  Marland Kitchen DEXILANT 60 MG capsule TAKE 1 CAPSULE BY MOUTH EVERY DAY (Patient taking differently: daily. )  . famotidine (PEPCID) 40 MG tablet Take 1 tablet (40 mg total) by mouth at bedtime.  . ondansetron (ZOFRAN) 4 MG tablet Take 1 tablet (4 mg total) by mouth every 8 (eight) hours as needed for nausea or vomiting.  Marland Kitchen PARoxetine (PAXIL) 40 MG tablet Take 1 tablet (40 mg total) by mouth daily. (Patient taking differently: Take 40 mg by mouth at bedtime. )  . polyethylene glycol powder (GLYCOLAX/MIRALAX) 17 GM/SCOOP powder Take 17 g by mouth 2 (two) times daily as needed.  . pravastatin (PRAVACHOL)  80 MG tablet Take 1 tablet (80 mg total) by mouth daily. (Patient taking differently: Take 80 mg by mouth at bedtime. )  . traMADol (ULTRAM) 50 MG tablet Take 1 tablet (50 mg total) by mouth every 6 (six) hours as needed.   No facility-administered encounter medications on file as of 05/30/2020.    Current Diagnosis: Patient Active Problem List   Diagnosis Date Noted  . Primary osteoarthritis of left knee 03/09/2018  . Degenerative arthritis of left knee 03/06/2018  . Bilateral hearing loss 06/13/2017  . Pharyngoesophageal dysphagia 06/13/2017  . Depression 10/09/2015  . Generalized anxiety disorder 10/09/2015  . Insomnia 01/18/2015  . Family history of premature coronary artery disease 03/31/2013  . Obesity   . Hypertension 12/14/2010  . GERD (gastroesophageal reflux disease) 12/14/2010  . HLD (hyperlipidemia) 12/14/2010  . PTSD (post-traumatic stress disorder) 12/14/2010  . Glaucoma 12/14/2010  . Panic attacks 12/14/2010  . Vitamin D deficiency 12/14/2010  . HEMORRHOIDS 03/22/2010  . Constipation 03/22/2010    Goals Addressed   None    Unable to reach patient.  Follow-Up:  Coordination of Enhanced Pharmacy Services    Have you seen any other providers since your last visit? no Any changes in your medications or health? no Any side effects from any medications? Unknown. Do you have an symptoms or problems not managed by your medications? Unknown. Any concerns about your health right  now? Unknown. Has your provider asked that you check blood pressure, blood sugar, or follow special diet at home? Unknown. Do you get any type of exercise on a regular basis? Unknown. Can you think of a goal you would like to reach for your health? Unknown. Do you have any problems getting your medications? Unknown. Is there anything that you would like to discuss during the appointment? Unknown.  Patient has an office visits with Leata Mouse, CPP 05/31/20 at [place]. Patient should have all  medications and supplements at the appointment.

## 2020-05-31 ENCOUNTER — Telehealth: Payer: Self-pay | Admitting: Family Medicine

## 2020-05-31 ENCOUNTER — Ambulatory Visit: Payer: Medicare Other

## 2020-05-31 NOTE — Progress Notes (Signed)
  Chronic Care Management   Note  05/31/2020 Name: Lynn [name] MRN: [id number] DOB: [birth year]  Lynn [name] is a 69 y.o. year old female who is a primary care patient of [place], Modena Nunnery, MD. I reached out to [employer] by phone today in response to a referral sent by [name]'s PCP, Buelah Manis, Modena Nunnery, MD.   [name] was given information about Chronic Care Management services today including:  1. CCM service includes personalized support from designated clinical staff supervised by her physician, including individualized plan of care and coordination with other care providers 2. 24/7 contact phone numbers for assistance for urgent and routine care needs. 3. Service will only be billed when office clinical staff spend 20 minutes or more in a month to coordinate care. 4. Only one practitioner may furnish and bill the service in a calendar month. 5. The patient may stop CCM services at any time (effective at the end of the month) by phone call to the office staff.   Patient agreed to services and verbal consent obtained.   Follow up plan:   Carley Perdue [employer] Scheduler

## 2020-07-02 ENCOUNTER — Other Ambulatory Visit: Payer: Self-pay | Admitting: Family Medicine

## 2020-07-14 ENCOUNTER — Telehealth: Payer: Self-pay | Admitting: Family Medicine

## 2020-07-14 ENCOUNTER — Encounter: Payer: Self-pay | Admitting: Family Medicine

## 2020-07-14 DIAGNOSIS — Z780 Asymptomatic menopausal state: Secondary | ICD-10-CM | POA: Diagnosis not present

## 2020-07-14 DIAGNOSIS — Z1231 Encounter for screening mammogram for malignant neoplasm of breast: Secondary | ICD-10-CM | POA: Diagnosis not present

## 2020-07-14 NOTE — Progress Notes (Signed)
  Chronic Care Management   Outreach Note  07/14/2020 Name: Lynn [name] MRN: [id number] DOB: 14-Jun-1951  Referred by: [name], MD Reason for referral : No chief complaint on file.   An unsuccessful telephone outreach was attempted today. The patient was referred to the pharmacist for assistance with care management and care coordination.   Follow Up Plan:   Carley Perdue [employer] Scheduler

## 2020-07-17 ENCOUNTER — Telehealth: Payer: Self-pay | Admitting: Pharmacist

## 2020-07-17 ENCOUNTER — Telehealth: Payer: Self-pay | Admitting: Family Medicine

## 2020-07-17 NOTE — Progress Notes (Signed)
Chronic Care Management Pharmacy Assistant   Name: Lynn [name]  MRN: [id number] DOB: 15-Nov-1950  Reason for Encounter: Initial Questions   PCP : [name], MD  Allergies:   Allergies  Allergen Reactions  . Diclofenac Sodium Anaphylaxis  . Hydrocodone Itching and Nausea And Vomiting  . Lipitor [Atorvastatin Calcium] Other (See Comments)    Increased LFT's, myalgias  . Niacin Other (See Comments)    Flushing  . Other Other (See Comments)    Allergic to metal states body rejects it  . Zolpidem Other (See Comments)    Severe headache    Medications: Outpatient Encounter Medications as of 07/17/2020  Medication Sig  . ALPRAZolam (XANAX) 1 MG tablet TAKE 1 TABLET BY MOUTH TWICE A DAY  . amLODipine (NORVASC) 10 MG tablet TAKE 1 TABLET BY MOUTH EVERY DAY  . benazepril (LOTENSIN) 20 MG tablet TAKE 1 TABLET BY MOUTH EVERY DAY (Patient taking differently: 10 mg. TAKE 1 TABLET BY MOUTH EVERY DAY)  . cholecalciferol (VITAMIN D) 1000 units tablet Take 2,000 Units by mouth 2 (two) times daily.  Marland Kitchen DEXILANT 60 MG capsule TAKE 1 CAPSULE BY MOUTH EVERY DAY (Patient taking differently: daily. )  . famotidine (PEPCID) 40 MG tablet Take 1 tablet (40 mg total) by mouth at bedtime.  . ondansetron (ZOFRAN) 4 MG tablet Take 1 tablet (4 mg total) by mouth every 8 (eight) hours as needed for nausea or vomiting.  . ondansetron (ZOFRAN-ODT) 4 MG disintegrating tablet TAKE 1 TABLET BY MOUTH EVERY 8 HOURS AS NEEDED FOR NAUSEA AND VOMITING  . PARoxetine (PAXIL) 40 MG tablet Take 1 tablet (40 mg total) by mouth daily. (Patient taking differently: Take 40 mg by mouth at bedtime. )  . polyethylene glycol powder (GLYCOLAX/MIRALAX) 17 GM/SCOOP powder Take 17 g by mouth 2 (two) times daily as needed.  . pravastatin (PRAVACHOL) 80 MG tablet Take 1 tablet (80 mg total) by mouth daily. (Patient taking differently: Take 80 mg by mouth at bedtime. )  . traMADol (ULTRAM) 50 MG tablet Take 1 tablet (50 mg  total) by mouth every 6 (six) hours as needed.  . triamcinolone cream (KENALOG) 0.1 % APPLY TO AFFECTED AREA TWICE A DAY   No facility-administered encounter medications on file as of 07/17/2020.    Current Diagnosis: Patient Active Problem List   Diagnosis Date Noted  . Primary osteoarthritis of left knee 03/09/2018  . Degenerative arthritis of left knee 03/06/2018  . Bilateral hearing loss 06/13/2017  . Pharyngoesophageal dysphagia 06/13/2017  . Depression 10/09/2015  . Generalized anxiety disorder 10/09/2015  . Insomnia 01/18/2015  . Family history of premature coronary artery disease 03/31/2013  . Obesity   . Hypertension 12/14/2010  . GERD (gastroesophageal reflux disease) 12/14/2010  . HLD (hyperlipidemia) 12/14/2010  . PTSD (post-traumatic stress disorder) 12/14/2010  . Glaucoma 12/14/2010  . Panic attacks 12/14/2010  . Vitamin D deficiency 12/14/2010  . HEMORRHOIDS 03/22/2010  . Constipation 03/22/2010    Goals Addressed   None     Have you seen any other providers since your last visit? Yes, patient stated she had a mammogram    Any changes in your medications or health? No   Any side effects from any medications? No   Do you have an symptoms or problems not managed by your medications? Patient states she has depression and anxiety, but feels like her symptoms have increased since COVID.   Any concerns about your health right now? None at this time  Has your provider asked that you check blood pressure, blood sugar, or follow special diet at home? No   Do you get any type of exercise on a regular basis? Patient states she walks daily for about 45-60 mins   Can you think of a goal you would like to reach for your health? Patient stated she would like assistance with dry heaving. Has taken medication in the past for GERD. Currently famotide 40 mg and dexilant 60 mg.   Do you have any problems getting your medications? No. Patient receives her  medications with [facility]   Is there anything that you would like to discuss during the appointment? Not at this time  Reminded the patient to please have medications and supplements available at her appointment Tuesday November 2nd at 9:00am over the telephone.   Follow-Up:  Pharmacist Review   Fanny Skates, [place] Pharmacist Assistant [phone]

## 2020-07-17 NOTE — Progress Notes (Signed)
  Chronic Care Management   Note  07/17/2020 Name: [name] MRN: [id number] DOB: 1951/04/03  Lynn [name] is a 69 y.o. year old female who is a primary care patient of [place], Modena Nunnery, MD. I reached out to [employer] by phone today in response to a referral sent by Ms. Lynn [name]'s PCP, Buelah Manis, Modena Nunnery, MD.   Lynn [name] was given information about Chronic Care Management services today including:  1. CCM service includes personalized support from designated clinical staff supervised by her physician, including individualized plan of care and coordination with other care providers 2. 24/7 contact phone numbers for assistance for urgent and routine care needs. 3. Service will only be billed when office clinical staff spend 20 minutes or more in a month to coordinate care. 4. Only one practitioner may furnish and bill the service in a calendar month. 5. The patient may stop CCM services at any time (effective at the end of the month) by phone call to the office staff.   Patient agreed to services and verbal consent obtained.   Follow up plan:   Carley Perdue [employer] Scheduler

## 2020-07-18 ENCOUNTER — Telehealth: Payer: Medicare Other

## 2020-07-18 ENCOUNTER — Ambulatory Visit: Payer: Medicare Other | Admitting: Pharmacist

## 2020-07-18 DIAGNOSIS — E785 Hyperlipidemia, unspecified: Secondary | ICD-10-CM

## 2020-07-18 DIAGNOSIS — I10 Essential (primary) hypertension: Secondary | ICD-10-CM

## 2020-07-18 DIAGNOSIS — F320 Major depressive disorder, single episode, mild: Secondary | ICD-10-CM

## 2020-07-18 NOTE — Patient Instructions (Addendum)
Visit Information Thank you for meeting with me today!  I look forward to working with you to help you meet all of your healthcare goals and answer any questions you may have.  Feel free to contact me anytime!  Goals Addressed            This Visit's Progress   . Pharmacy Care Plan:       CARE PLAN ENTRY (see longitudinal plan of care for additional care plan information)  Current Barriers:  . Chronic Disease Management support, education, and care coordination needs related to Hypertension, Hyperlipidemia, and Depression   Hypertension BP Readings from Last 3 Encounters:  05/18/20 (!) 161/71  05/15/20 124/60  03/22/20 129/70   . Pharmacist Clinical Goal(s): o Over the next 180 days, patient will work with PharmD and providers to maintain BP goal <140/90 . Current regimen:  o Amlodipine 10mg  o Benazepril 10mg  . Interventions: o Reviewed home monitoring frequency o Discussed BP goal < 140/90. o Encouraged continued daily walking. . Patient self care activities - Over the next 180 days, patient will: o Check BP periodically, document, and provide at future appointments o Ensure daily salt intake < 2300 mg/day o Contact providers if BP consistently > 140/90.  Hyperlipidemia Lab Results  Component Value Date/Time   LDLCALC 101 (H) 05/15/2020 09:21 AM   . Pharmacist Clinical Goal(s): o Over the next 180 days, patient will work with PharmD and providers to achieve LDL goal < 100 . Current regimen:  o Pravastatin 80mg  . Interventions: o Reviewed most recent lipid panel and discussed goals o Encouraged continued diet and exercise lifestyle mods . Patient self care activities - Over the next 180 days, patient will: o Continue to take medication as directed o Focus on medication adherence by pill count  Depression . Pharmacist Clinical Goal(s) o Over the next 180 days, patient will work with PharmD and providers to optimize medication and minimize symptoms of  depression. . Current regimen:  o Alprazolam 1mg  twice daily o Paroxetine 40mg  at bedtime . Interventions: o Encouraged continued exercise o Evaluated current sleep patterns . Patient self care activities - Over the next 180 days, patient will: o Continue to take medication as directed o Contact providers with new or worsening symptoms  Initial goal documentation        Lynn [name] was given information about Chronic Care Management services today including:  1. CCM service includes personalized support from designated clinical staff supervised by her physician, including individualized plan of care and coordination with other care providers 2. 24/7 contact phone numbers for assistance for urgent and routine care needs. 3. Standard insurance, coinsurance, copays and deductibles apply for chronic care management only during months in which we provide at least 20 minutes of these services. Most insurances cover these services at 100%, however patients may be responsible for any copay, coinsurance and/or deductible if applicable. This service may help you avoid the need for more expensive face-to-face services. 4. Only one practitioner may furnish and bill the service in a calendar month. 5. The patient may stop CCM services at any time (effective at the end of the month) by phone call to the office staff.  Patient agreed to services and verbal consent obtained.   The patient verbalized understanding of instructions provided today and agreed to receive a mailed copy of patient instruction and/or educational materials. Telephone follow up appointment with pharmacy team member scheduled for: 6 months  Beverly Milch, PharmD Clinical Pharmacist [facility]  Medicine [phone]   Living With Depression Everyone experiences occasional disappointment, sadness, and loss in their lives. When you are feeling down, blue, or sad for at least 2 weeks in a row, it may mean that you  have depression. Depression can affect your thoughts and feelings, relationships, daily activities, and physical health. It is caused by changes in the way your brain functions. If you receive a diagnosis of depression, your health care provider will tell you which type of depression you have and what treatment options are available to you. If you are living with depression, there are ways to help you recover from it and also ways to prevent it from coming back. How to cope with lifestyle changes Coping with stress     Stress is your body's reaction to life changes and events, both good and bad. Stressful situations may include:  Getting married.  The death of a spouse.  Losing a job.  Retiring.  Having a baby. Stress can last just a few hours or it can be ongoing. Stress can play a major role in depression, so it is important to learn both how to cope with stress and how to think about it differently. Talk with your health care provider or a counselor if you would like to learn more about stress reduction. He or she may suggest some stress reduction techniques, such as:  Music therapy. This can include creating music or listening to music. Choose music that you enjoy and that inspires you.  Mindfulness-based meditation. This kind of meditation can be done while sitting or walking. It involves being aware of your normal breaths, rather than trying to control your breathing.  Centering prayer. This is a kind of meditation that involves focusing on a spiritual word or phrase. Choose a word, phrase, or sacred image that is meaningful to you and that brings you peace.  Deep breathing. To do this, expand your stomach and inhale slowly through your nose. Hold your breath for 3-5 seconds, then exhale slowly, allowing your stomach muscles to relax.  Muscle relaxation. This involves intentionally tensing muscles then relaxing them. Choose a stress reduction technique that fits your lifestyle and  personality. Stress reduction techniques take time and practice to develop. Set aside 5-15 minutes a day to do them. Therapists can offer training in these techniques. The training may be covered by some insurance plans. Other things you can do to manage stress include:  Keeping a stress diary. This can help you learn what triggers your stress and ways to control your response.  Understanding what your limits are and saying no to requests or events that lead to a schedule that is too full.  Thinking about how you respond to certain situations. You may not be able to control everything, but you can control how you react.  Adding humor to your life by watching funny films or TV shows.  Making time for activities that help you relax and not feeling guilty about spending your time this way.  Medicines Your health care provider may suggest certain medicines if he or she feels that they will help improve your condition. Avoid using alcohol and other substances that may prevent your medicines from working properly (may interact). It is also important to:  Talk with your pharmacist or health care provider about all the medicines that you take, their possible side effects, and what medicines are safe to take together.  Make it your goal to take part in all treatment decisions (shared  decision-making). This includes giving input on the side effects of medicines. It is best if shared decision-making with your health care provider is part of your total treatment plan. If your health care provider prescribes a medicine, you may not notice the full benefits of it for 4-8 weeks. Most people who are treated for depression need to be on medicine for at least 6-12 months after they feel better. If you are taking medicines as part of your treatment, do not stop taking medicines without first talking to your health care provider. You may need to have the medicine slowly decreased (tapered) over time to decrease the  risk of harmful side effects. Relationships Your health care provider may suggest family therapy along with individual therapy and drug therapy. While there may not be family problems that are causing you to feel depressed, it is still important to make sure your family learns as much as they can about your mental health. Having your family's support can help make your treatment successful. How to recognize changes in your condition Everyone has a different response to treatment for depression. Recovery from major depression happens when you have not had signs of major depression for two months. This may mean that you will start to:  Have more interest in doing activities.  Feel less hopeless than you did 2 months ago.  Have more energy.  Overeat less often, or have better or improving appetite.  Have better concentration. Your health care provider will work with you to decide the next steps in your recovery. It is also important to recognize when your condition is getting worse. Watch for these signs:  Having fatigue or low energy.  Eating too much or too little.  Sleeping too much or too little.  Feeling restless, agitated, or hopeless.  Having trouble concentrating or making decisions.  Having unexplained physical complaints.  Feeling irritable, angry, or aggressive. Get help as soon as you or your family members notice these symptoms coming back. How to get support and help from others How to talk with friends and family members about your condition  Talking to friends and family members about your condition can provide you with one way to get support and guidance. Reach out to trusted friends or family members, explain your symptoms to them, and let them know that you are working with a health care provider to treat your depression. Financial resources Not all insurance plans cover mental health care, so it is important to check with your insurance carrier. If paying for  co-pays or counseling services is a problem, search for a local or county mental health care center. They may be able to offer public mental health care services at low or no cost when you are not able to see a private health care provider. If you are taking medicine for depression, you may be able to get the generic form, which may be less expensive. Some makers of prescription medicines also offer help to patients who cannot afford the medicines they need. Follow these instructions at home:   Get the right amount and quality of sleep.  Cut down on using caffeine, tobacco, alcohol, and other potentially harmful substances.  Try to exercise, such as walking or lifting small weights.  Take over-the-counter and prescription medicines only as told by your health care provider.  Eat a healthy diet that includes plenty of vegetables, fruits, whole grains, low-fat dairy products, and lean protein. Do not eat a lot of foods that are high in  solid fats, added sugars, or salt.  Keep all follow-up visits as told by your health care provider. This is important. Contact a health care provider if:  You stop taking your antidepressant medicines, and you have any of these symptoms: ? Nausea. ? Headache. ? Feeling lightheaded. ? Chills and body aches. ? Not being able to sleep (insomnia).  You or your friends and family think your depression is getting worse. Get help right away if:  You have thoughts of hurting yourself or others. If you ever feel like you may hurt yourself or others, or have thoughts about taking your own life, get help right away. You can go to your nearest emergency department or call:  Your local emergency services (911 in the [place]  A suicide crisis helpline, such as the [place] at (814) 799-3121. This is open 24-hours a day. Summary  If you are living with depression, there are ways to help you recover from it and also ways to prevent it from  coming back.  Work with your health care team to create a management plan that includes counseling, stress management techniques, and healthy lifestyle habits. This information is not intended to replace advice given to you by your health care provider. Make sure you discuss any questions you have with your health care provider. Document Revised: 12/25/2018 Document Reviewed: 08/05/2016 Elsevier Patient Education  [place].

## 2020-07-18 NOTE — Chronic Care Management (AMB) (Signed)
Chronic Care Management Pharmacy  Name: Lynn [name]  MRN: [id number] DOB: 03/17/1951  Chief Complaint/ HPI  Lynn [name],  69 y.o. , female presents for their Initial CCM visit with the clinical pharmacist via telephone.  PCP : [name], MD  Their chronic conditions include: HTN, GERD, HLD, Insomnia, Anxiety/depression.  Office Visits: 05/15/2020 [facility]) - Patient presents for regular follow up.  No concerns noted, regular 6 month follow up.  She is to schedule bone density scan and recommended to take miralax once daily.  Consult Visit: none recent  Medications: Outpatient Encounter Medications as of 07/18/2020  Medication Sig  . ALPRAZolam (XANAX) 1 MG tablet TAKE 1 TABLET BY MOUTH TWICE A DAY  . amLODipine (NORVASC) 10 MG tablet TAKE 1 TABLET BY MOUTH EVERY DAY  . benazepril (LOTENSIN) 20 MG tablet TAKE 1 TABLET BY MOUTH EVERY DAY (Patient taking differently: 10 mg. TAKE 1 TABLET BY MOUTH EVERY DAY)  . cholecalciferol (VITAMIN D) 1000 units tablet Take 2,000 Units by mouth 2 (two) times daily.  Marland Kitchen DEXILANT 60 MG capsule TAKE 1 CAPSULE BY MOUTH EVERY DAY (Patient taking differently: daily. )  . famotidine (PEPCID) 40 MG tablet Take 1 tablet (40 mg total) by mouth at bedtime.  . ondansetron (ZOFRAN) 4 MG tablet Take 1 tablet (4 mg total) by mouth every 8 (eight) hours as needed for nausea or vomiting.  . ondansetron (ZOFRAN-ODT) 4 MG disintegrating tablet TAKE 1 TABLET BY MOUTH EVERY 8 HOURS AS NEEDED FOR NAUSEA AND VOMITING  . PARoxetine (PAXIL) 40 MG tablet Take 1 tablet (40 mg total) by mouth daily. (Patient taking differently: Take 40 mg by mouth at bedtime. )  . polyethylene glycol powder (GLYCOLAX/MIRALAX) 17 GM/SCOOP powder Take 17 g by mouth 2 (two) times daily as needed.  . pravastatin (PRAVACHOL) 80 MG tablet Take 1 tablet (80 mg total) by mouth daily. (Patient taking differently: Take 80 mg by mouth at bedtime. )  . traMADol (ULTRAM) 50 MG tablet Take 1  tablet (50 mg total) by mouth every 6 (six) hours as needed. (Patient not taking: Reported on 07/18/2020)  . triamcinolone cream (KENALOG) 0.1 % APPLY TO AFFECTED AREA TWICE A DAY (Patient not taking: Reported on 07/18/2020)   No facility-administered encounter medications on file as of 07/18/2020.     Current Diagnosis/Assessment:   Emergency planning/management officer Strain: Low Risk   . Difficulty of Paying Living Expenses: Not very hard    Goals Addressed            This Visit's Progress   . Pharmacy Care Plan:       CARE PLAN ENTRY (see longitudinal plan of care for additional care plan information)  Current Barriers:  . Chronic Disease Management support, education, and care coordination needs related to Hypertension, Hyperlipidemia, and Depression   Hypertension BP Readings from Last 3 Encounters:  05/18/20 (!) 161/71  05/15/20 124/60  03/22/20 129/70   . Pharmacist Clinical Goal(s): o Over the next 180 days, patient will work with PharmD and providers to maintain BP goal <140/90 . Current regimen:  o Amlodipine 49m o Benazepril 145m. Interventions: o Reviewed home monitoring frequency o Discussed BP goal < 140/90. o Encouraged continued daily walking. . Patient self care activities - Over the next 180 days, patient will: o Check BP periodically, document, and provide at future appointments o Ensure daily salt intake < 2300 mg/day o Contact providers if BP consistently > 140/90.  Hyperlipidemia Lab Results  Component  Value Date/Time   LDLCALC 101 (H) 05/15/2020 09:21 AM   . Pharmacist Clinical Goal(s): o Over the next 180 days, patient will work with PharmD and providers to achieve LDL goal < 100 . Current regimen:  o Pravastatin 46m . Interventions: o Reviewed most recent lipid panel and discussed goals o Encouraged continued diet and exercise lifestyle mods . Patient self care activities - Over the next 180 days, patient will: o Continue to take medication as  directed o Focus on medication adherence by pill count  Depression . Pharmacist Clinical Goal(s) o Over the next 180 days, patient will work with PharmD and providers to optimize medication and minimize symptoms of depression. . Current regimen:  o Alprazolam 181mtwice daily o Paroxetine 4013mt bedtime . Interventions: o Encouraged continued exercise o Evaluated current sleep patterns . Patient self care activities - Over the next 180 days, patient will: o Continue to take medication as directed o Contact providers with new or worsening symptoms  Initial goal documentation        Hypertension   BP goal is:  <140/90  Office blood pressures are  BP Readings from Last 3 Encounters:  05/18/20 (!) 161/71  05/15/20 124/60  03/22/20 129/70   Patient checks BP at home infrequently Patient home BP readings are ranging: no logs available  Patient has failed these meds in the past: none noted Patient is currently controlled on the following medications:  . Amlodipine 53m30mBenazepril 53mg53m discussed   She denies swelling, dizziness, headache.  She is not checking her BP on a regular basis, however she does check at her moms house periodically whenever she goes there just to keep an eye on it.  Counseled on BP goal < 140/90, she is to call us if[place]oreaP is ever consistently above this goal.  Adherent to medications and takes appropriately  Discussed current exercise - she walks 30-45 minutes a day down to her mother's house and back as long as the weather is good  Plan  Continue current medications  Monitor BP at home at least periodically     Hyperlipidemia   LDL goal < 100  Last lipids Lab Results  Component Value Date   CHOL 174 05/15/2020   HDL 57 05/15/2020   LDLCALC 101 (H) 05/15/2020   TRIG 69 05/15/2020   CHOLHDL 3.1 05/15/2020   Hepatic Function Latest Ref Rng & Units 05/15/2020 01/11/2020 08/18/2018  Total Protein 6.1 - 8.1 g/dL 6.8 7.2 7.2  Albumin  3.6 - 5.1 g/dL - - -  AST 10 - 35 U/L _0 ALT 6 - 29 U/L _1 Alk Phosphatase 33 - 130 U/L - - -  Total Bilirubin 0.2 - 1.2 mg/dL 0.5 0.7 0.6  Bilirubin, Direct 0.0 - 0.3 mg/dL - - -     The 10-year ASCVD risk score (GoffMikey Bussingr., et al., 2013) is: 11.9%   Values used to calculate the score:     Age: 69 ye95s     Sex: Female     Is Non-Hispanic African American: No     Diabetic: No     Tobacco smoker: No     Systolic Blood Pressure: 161 m559     Is BP treated: Yes     HDL Cholesterol: 57 mg/dL     Total Cholesterol: 174 mg/dL   Patient has failed these meds in past: lipitor (myalgias), Niacin (flushing) Patient is currently controlled on the following  medications:  . Pravastatin 37m  We discussed:    Patient is controlled based on last lipid panel  She has borderline LDL, but acceptable  Currently working on her diet including: drinking only water, eating less fried and fatty foods  Exercise: see above, patient very active walking  Plan  Continue current medications  Continue current lifestyle mods GERD   Patient has failed these meds in past: none noted Patient is currently controlled on the following medications:  . Dexilant 657m. Famotidine 4039mWe discussed:    Taking medications appropriately, denies symptoms of acid reflux.  She does admit to nausea with dry heaving almost daily, however, says that there is no acid when this is happening.  Has tried everything and had GI consults etc with no resolution.   Plan  Continue current medications Insomnia   Patient has failed these meds in past: zolpidem (HA) Patient is currently controlled on the following medications:  . None  We discussed:    She reports no issues sleeping currently.  Plan  Continue current medications Depression/Anxiety   Patient has failed these meds in past: none noted Patient is currently controlled on the following medications:  . Alprazolam 1mg86mParoxetine  40mg34mQ9 SCORE ONLY 05/15/2020 01/11/2020 08/18/2018  PHQ-9 Total Score _0 We discussed:    Mentions she thinks depression has been worse since COVID-19 pandemic.  She still gets out of the house and does things, just reports her mind wanders.  She prefers to fix it herself and not change medications at this time.  Encouraged her to continue her positive lifestyle changes and focus on that to help her get through the tough times.  Plan  Continue current medications  Contact providers with new or worsening symptoms  Vaccines   Reviewed and discussed patient's vaccination history.    Immunization History  Administered Date(s) Administered  . Fluad Quad(high Dose 65+) 06/25/2019  . Influenza Split 11/07/2012  . Influenza Whole 06/17/2007  . Influenza,inj,Quad PF,6+ Mos 10/09/2015, 10/14/2016, 10/27/2017, 08/18/2018  . Pneumococcal Conjugate-13 01/11/2020  . Pneumococcal Polysaccharide-23 11/07/2012  . Tdap 04/14/2014  . Zoster 01/18/2015    Plan  Recommended patient receive flu vaccine in office.  Medication Management   . Miscellaneous medications:  o Zofran 4mg .22mC's:  o Vitamin D 1000u o Miralax 17g . [facility]  [phone] reports using vials method to organize medications and promote adherence. . Patient denies missed doses of medication.   ChristBeverly MilchmD Clinical Pharmacist [place] [phone]

## 2020-07-19 ENCOUNTER — Encounter: Payer: Self-pay | Admitting: *Deleted

## 2020-08-03 ENCOUNTER — Other Ambulatory Visit: Payer: Self-pay | Admitting: Family Medicine

## 2020-08-03 NOTE — Telephone Encounter (Signed)
Last refill: 05/30/20

## 2020-08-25 ENCOUNTER — Other Ambulatory Visit: Payer: Self-pay | Admitting: Family Medicine

## 2020-09-02 ENCOUNTER — Other Ambulatory Visit: Payer: Self-pay | Admitting: Family Medicine

## 2020-09-02 DIAGNOSIS — E785 Hyperlipidemia, unspecified: Secondary | ICD-10-CM

## 2020-09-02 DIAGNOSIS — I10 Essential (primary) hypertension: Secondary | ICD-10-CM

## 2020-09-02 DIAGNOSIS — F411 Generalized anxiety disorder: Secondary | ICD-10-CM

## 2020-11-01 ENCOUNTER — Other Ambulatory Visit: Payer: Self-pay | Admitting: Family Medicine

## 2020-11-01 DIAGNOSIS — E785 Hyperlipidemia, unspecified: Secondary | ICD-10-CM

## 2020-11-01 MED ORDER — ALPRAZOLAM 1 MG PO TABS
1.0000 mg | ORAL_TABLET | Freq: Two times a day (BID) | ORAL | 2 refills | Status: DC
Start: 2020-11-01 — End: 2020-11-22

## 2020-11-01 MED ORDER — PRAVASTATIN SODIUM 80 MG PO TABS: 80.0000 mg | ORAL_TABLET | Freq: Every day | ORAL | 1 refills | Status: DC

## 2020-11-01 MED ORDER — ONDANSETRON HCL 4 MG PO TABS
4.0000 mg | ORAL_TABLET | Freq: Three times a day (TID) | ORAL | 1 refills | Status: AC | PRN
Start: 2020-11-01 — End: ?

## 2020-11-01 NOTE — Telephone Encounter (Signed)
CB# [phone]  Patient called requesting refills on pravastatin, zofran, xanax she uses [facility] [place].

## 2020-11-01 NOTE — Telephone Encounter (Signed)
Routine medications refilled.   Ok to refill xanax??  Last office visit 05/15/2020.  Last refill 08/03/2020.

## 2020-11-07 ENCOUNTER — Telehealth: Payer: Self-pay | Admitting: Pharmacist

## 2020-11-07 NOTE — Progress Notes (Addendum)
Chronic Care Management Pharmacy Assistant   Name: KALYANI MAEDA  MRN: [id number] DOB: [birth year]  Reason for Encounter: Disease State For HTN.  Patient Questions:  1.  Have you seen any other providers since your last visit? No   2.  Any changes in your medicines or health? No.   PCP : [name], MD   Their chronic conditions include: HTN, GERD, HLD, Insomnia, Anxiety/depression.  Office Visits: None since 07/18/20  Consults: None since 07/18/20  Allergies:   Allergies  Allergen Reactions   Diclofenac Sodium Anaphylaxis   Hydrocodone Itching and Nausea And Vomiting   Lipitor [Atorvastatin Calcium] Other (See Comments)    Increased LFT's, myalgias   Niacin Other (See Comments)    Flushing   Other Other (See Comments)    Allergic to metal states body rejects it   Zolpidem Other (See Comments)    Severe headache    Medications: Outpatient Encounter Medications as of 11/07/2020  Medication Sig   benazepril (LOTENSIN) 20 MG tablet TAKE 1 TABLET BY MOUTH EVERY DAY   PARoxetine (PAXIL) 40 MG tablet TAKE 1 TABLET BY MOUTH EVERY DAY   ALPRAZolam (XANAX) 1 MG tablet Take 1 tablet (1 mg total) by mouth 2 (two) times daily.   amLODipine (NORVASC) 10 MG tablet TAKE 1 TABLET BY MOUTH EVERY DAY   cholecalciferol (VITAMIN D) 1000 units tablet Take 2,000 Units by mouth 2 (two) times daily.   DEXILANT 60 MG capsule TAKE 1 CAPSULE BY MOUTH EVERY DAY   famotidine (PEPCID) 40 MG tablet Take 1 tablet (40 mg total) by mouth at bedtime.   ondansetron (ZOFRAN) 4 MG tablet Take 1 tablet (4 mg total) by mouth every 8 (eight) hours as needed for nausea or vomiting.   ondansetron (ZOFRAN-ODT) 4 MG disintegrating tablet TAKE 1 TABLET BY MOUTH EVERY 8 HOURS AS NEEDED FOR NAUSEA AND VOMITING   polyethylene glycol powder (GLYCOLAX/MIRALAX) 17 GM/SCOOP powder Take 17 g by mouth 2 (two) times daily as needed.   pravastatin (PRAVACHOL) 80 MG tablet Take 1 tablet (80 mg total) by mouth daily.    traMADol (ULTRAM) 50 MG tablet Take 1 tablet (50 mg total) by mouth every 6 (six) hours as needed. (Patient not taking: Reported on 07/18/2020)   triamcinolone cream (KENALOG) 0.1 % APPLY TO AFFECTED AREA TWICE A DAY (Patient not taking: Reported on 07/18/2020)   No facility-administered encounter medications on file as of 11/07/2020.    Current Diagnosis: Patient Active Problem List   Diagnosis Date Noted   Primary osteoarthritis of left knee 03/09/2018   Degenerative arthritis of left knee 03/06/2018   Bilateral hearing loss 06/13/2017   Pharyngoesophageal dysphagia 06/13/2017   Depression 10/09/2015   Generalized anxiety disorder 10/09/2015   Insomnia 01/18/2015   Family history of premature coronary artery disease 03/31/2013   Obesity    Hypertension 12/14/2010   GERD (gastroesophageal reflux disease) 12/14/2010   HLD (hyperlipidemia) 12/14/2010   PTSD (post-traumatic stress disorder) 12/14/2010   Glaucoma 12/14/2010   Panic attacks 12/14/2010   Vitamin D deficiency 12/14/2010   HEMORRHOIDS 03/22/2010   Constipation 03/22/2010    Goals Addressed   None    Reviewed chart prior to disease state call. Spoke with patient regarding BP  Recent Office Vitals: BP Readings from Last 3 Encounters:  05/18/20 (!) 161/71  05/15/20 124/60  03/22/20 129/70   Pulse Readings from Last 3 Encounters:  05/18/20 (!) 54  05/15/20 72  03/22/20 60    Wt  Readings from Last 3 Encounters:  05/18/20 163 lb (73.9 kg)  05/15/20 162 lb (73.5 kg)  03/22/20 164 lb (74.4 kg)     Kidney Function Lab Results  Component Value Date/Time   CREATININE 0.70 05/18/2020 06:58 AM   CREATININE 0.66 05/15/2020 09:21 AM   CREATININE 0.70 01/11/2020 09:26 AM   GFRNONAA 88 04/27/2018 08:12 AM   GFRAA 103 04/27/2018 08:12 AM    BMP Latest Ref Rng & Units 05/18/2020 05/15/2020 01/11/2020  Glucose 70 - 99 mg/dL 107(H) 114(H) 108(H)  BUN 8 - 23 mg/dL 27(H) 19 21  Creatinine 0.44 - 1.00 mg/dL 0.70 0.66  0.70  BUN/Creat Ratio 6 - 22 (calc) - NOT APPLICABLE NOT APPLICABLE  Sodium 440 - 145 mmol/L 143 143 142  Potassium 3.5 - 5.1 mmol/L 4.0 4.1 4.4  Chloride 98 - 111 mmol/L 102 106 106  CO2 20 - 32 mmol/L - 31 31  Calcium 8.6 - 10.4 mg/dL - 9.3 9.8    Current antihypertensive regimen:  Amlodipine 10 mg Benazepril 10 mg  What recent interventions/DTPs have been made by any provider to improve Blood Pressure control since last CPP Visit: None.  Adherence Review: Is the patient currently on ACE/ARB medication?  Yes, Benazepril 20 mg   Does the patient have >5 day gap between last estimated fill dates? No   Third unsuccessful telephone outreach was attempted today. The patient was referred to the pharmacist for assistance with care management and care coordination.    Follow-Up:  Pharmacist Review   Charlann Lange, [place] Clinical Pharmacist Assistant [phone]  Patient has visit with PharmD scheduled for May.  Will move disease state call to next month.  Chart reviewed for pertinent issues, none found. 5 minutes spent in review, coordination, and documentation.  Reviewed by: Beverly Milch, PharmD Clinical Pharmacist [facility] [phone]

## 2020-11-09 ENCOUNTER — Other Ambulatory Visit: Payer: Self-pay | Admitting: Family Medicine

## 2020-11-09 NOTE — Telephone Encounter (Signed)
Pt called needing a refill of ALPRAZolam (XANAX) 1 MG tablet   She has a new pcp and will need some of her meds refilled until her appt in April.   CB#: [phone]

## 2020-11-09 NOTE — Telephone Encounter (Signed)
Medication was filled on 11/01/2020.  Refill too soon.

## 2020-11-13 ENCOUNTER — Telehealth: Payer: Self-pay | Admitting: Pharmacist

## 2020-11-13 NOTE — Progress Notes (Signed)
    Chronic Care Management Pharmacy Assistant   Name: Lynn [name]  MRN: [id number] DOB: 26-Jul-1951  Reason for Encounter: Adherence Review  PCP : [name], MD  Verified Adherence Gap Information. Per insurance data patient has met their wellness bundle and their annual wellness visit screening. The patient is current with their colorectal cancer screening. As well as having a up to date breast cancer screening with normal results. Their most recent A1C is 5.6 on 05/15/20. Their most recent blood pressure was 118/61 on 06/13/20. The patient met their goal by keeping their blood pressure below 140/90.  Follow-Up:  Pharmacist Review   Charlann Lange, [place] Pharmacist Assistant [phone]

## 2020-11-22 ENCOUNTER — Other Ambulatory Visit: Payer: Self-pay | Admitting: *Deleted

## 2020-11-22 MED ORDER — ALPRAZOLAM 1 MG PO TABS: 1.0000 mg | ORAL_TABLET | Freq: Two times a day (BID) | ORAL | 2 refills | Status: AC

## 2020-11-22 MED ORDER — AMLODIPINE BESYLATE 10 MG PO TABS
10.0000 mg | ORAL_TABLET | Freq: Every day | ORAL | 1 refills | Status: DC
Start: 2020-11-22 — End: 2021-06-29

## 2020-11-22 NOTE — Telephone Encounter (Signed)
Received call from patient.   States that she has upcoming appointment with Dr. Loyal Buba at [facility] scheduled in April 2022.  Reports that she requires refills on Amlodipine and Xanax prior to appointment.   Ok to refill Xanax?  Last office visit 01/11/2020.  Last refill 10/29/2020.

## 2020-12-01 ENCOUNTER — Telehealth: Payer: Self-pay | Admitting: Pharmacist

## 2020-12-01 NOTE — Progress Notes (Addendum)
Chronic Care Management Pharmacy Assistant   Name: Lynn [name]  MRN: [id number] DOB: [birth year]  Reason for Encounter: Disease State For HTN.    Conditions to be addressed/monitored: HTN, GERD, HLD, Insomnia, Anxiety/depression.  Recent office visits:  None since 11/13/20  Recent consult visits:  None since 11/13/20  Hospital visits:  None since 11/13/20  Medications: Outpatient Encounter Medications as of 12/01/2020  Medication Sig   benazepril (LOTENSIN) 20 MG tablet TAKE 1 TABLET BY MOUTH EVERY DAY   PARoxetine (PAXIL) 40 MG tablet TAKE 1 TABLET BY MOUTH EVERY DAY   ALPRAZolam (XANAX) 1 MG tablet Take 1 tablet (1 mg total) by mouth 2 (two) times daily.   amLODipine (NORVASC) 10 MG tablet Take 1 tablet (10 mg total) by mouth daily.   cholecalciferol (VITAMIN D) 1000 units tablet Take 2,000 Units by mouth 2 (two) times daily.   DEXILANT 60 MG capsule TAKE 1 CAPSULE BY MOUTH EVERY DAY   famotidine (PEPCID) 40 MG tablet Take 1 tablet (40 mg total) by mouth at bedtime.   ondansetron (ZOFRAN) 4 MG tablet Take 1 tablet (4 mg total) by mouth every 8 (eight) hours as needed for nausea or vomiting.   ondansetron (ZOFRAN-ODT) 4 MG disintegrating tablet TAKE 1 TABLET BY MOUTH EVERY 8 HOURS AS NEEDED FOR NAUSEA AND VOMITING   polyethylene glycol powder (GLYCOLAX/MIRALAX) 17 GM/SCOOP powder Take 17 g by mouth 2 (two) times daily as needed.   pravastatin (PRAVACHOL) 80 MG tablet Take 1 tablet (80 mg total) by mouth daily.   traMADol (ULTRAM) 50 MG tablet Take 1 tablet (50 mg total) by mouth every 6 (six) hours as needed. (Patient not taking: Reported on 07/18/2020)   triamcinolone cream (KENALOG) 0.1 % APPLY TO AFFECTED AREA TWICE A DAY (Patient not taking: Reported on 07/18/2020)   No facility-administered encounter medications on file as of 12/01/2020.    Reviewed chart prior to disease state call. Spoke with patient regarding BP  Recent Office Vitals: BP Readings from Last 3  Encounters:  05/18/20 (!) 161/71  05/15/20 124/60  03/22/20 129/70   Pulse Readings from Last 3 Encounters:  05/18/20 (!) 54  05/15/20 72  03/22/20 60    Wt Readings from Last 3 Encounters:  05/18/20 163 lb (73.9 kg)  05/15/20 162 lb (73.5 kg)  03/22/20 164 lb (74.4 kg)     Kidney Function Lab Results  Component Value Date/Time   CREATININE 0.70 05/18/2020 06:58 AM   CREATININE 0.66 05/15/2020 09:21 AM   CREATININE 0.70 01/11/2020 09:26 AM   GFRNONAA 88 04/27/2018 08:12 AM   GFRAA 103 04/27/2018 08:12 AM    BMP Latest Ref Rng & Units 05/18/2020 05/15/2020 01/11/2020  Glucose 70 - 99 mg/dL 107(H) 114(H) 108(H)  BUN 8 - 23 mg/dL 27(H) 19 21  Creatinine 0.44 - 1.00 mg/dL 0.70 0.66 0.70  BUN/Creat Ratio 6 - 22 (calc) - NOT APPLICABLE NOT APPLICABLE  Sodium 409 - 145 mmol/L 143 143 142  Potassium 3.5 - 5.1 mmol/L 4.0 4.1 4.4  Chloride 98 - 111 mmol/L 102 106 106  CO2 20 - 32 mmol/L - 31 31  Calcium 8.6 - 10.4 mg/dL - 9.3 9.8    Current antihypertensive regimen:  Amlodipine 10 mg Benazepril 10 mg  What recent interventions/DTPs have been made by any provider to improve Blood Pressure control since last CPP Visit: None.  Any recent hospitalizations or ED visits since last visit with CPP? No.  Adherence Review: Is the patient currently  on ACE/ARB medication? Benazepril 20 mg   Does the patient have >5 day gap between last estimated fill dates? Per misc rpts, Yes.  Star Rating Drugs: Benazepril 20 mg 1 tablet daily 06/28/20 90 DS,  pravastatin 80 mg 1 tablet daily 11/01/20 90 DS.  Patient informed me that she has an appointment with another doctor on April 26 st.   Follow-Up: Pharmacist Review  Charlann Lange, RMA Clinical Pharmacist Assistant [phone]  10 minutes spent in review, coordination, and documentation.  Reviewed by: Beverly Milch, PharmD Clinical Pharmacist [facility] [phone]

## 2020-12-14 ENCOUNTER — Telehealth: Payer: Self-pay | Admitting: Pharmacist

## 2020-12-14 NOTE — Progress Notes (Addendum)
    Chronic Care Management Pharmacy Assistant   Name: Lynn [name]  MRN: [id number] DOB: [birth year]   Reason for Encounter: Adherence Review  I reviewed the chart for any medical/health changes and/or medication changes, there were not any changes at this time.  Verified Adherence Gap Information. Per insurance data patient has met their wellness bundle and annual wellness screening. The patient also met their breast cancer screening. The patient is a non-tobacco user. Their most recent A1C was 5.6 on 05/15/20. Their most recent blood pressure was 118/61 06/13/20.The patient met goal by keeping their blood pressure less than 140/90.    Follow-Up:Pharmacist Review  Charlann Lange, [place] Pharmacist Assistant [phone]

## 2020-12-20 DIAGNOSIS — F331 Major depressive disorder, recurrent, moderate: Secondary | ICD-10-CM | POA: Diagnosis not present

## 2020-12-20 DIAGNOSIS — K219 Gastro-esophageal reflux disease without esophagitis: Secondary | ICD-10-CM | POA: Diagnosis not present

## 2020-12-20 DIAGNOSIS — I10 Essential (primary) hypertension: Secondary | ICD-10-CM | POA: Diagnosis not present

## 2020-12-20 DIAGNOSIS — E782 Mixed hyperlipidemia: Secondary | ICD-10-CM | POA: Diagnosis not present

## 2021-01-15 ENCOUNTER — Telehealth: Payer: Self-pay

## 2021-01-15 NOTE — Progress Notes (Deleted)
Chronic Care Management Pharmacy Note  01/15/2021 Name:  Lynn [name] MRN:  [id number] DOB:  [birth year]  Subjective: Lynn [name] is an 70 y.o. year old female who is a primary patient of No primary care provider on file..  The CCM team was consulted for assistance with disease management and care coordination needs.    {CCMTELEPHONEFACETOFACE:21091510} for {CCMINITIALFOLLOWUPCHOICE:21091511} in response to provider referral for pharmacy case management and/or care coordination services.   Consent to Services:  {CCMCONSENTOPTIONS:25074}  Patient Care Team: Edythe Clarity, [facility] as Pharmacist (Pharmacist)  Recent office visits: ***  Recent consult visits: [facility] visits: {Hospital DC Yes/No:25215}  Objective:  Lab Results  Component Value Date   CREATININE 0.70 05/18/2020   BUN 27 (H) 05/18/2020   GFRNONAA 88 04/27/2018   GFRAA 103 04/27/2018   NA 143 05/18/2020   K 4.0 05/18/2020   CALCIUM 9.3 05/15/2020   CO2 31 05/15/2020   GLUCOSE 107 (H) 05/18/2020    Lab Results  Component Value Date/Time   HGBA1C 5.6 05/15/2020 09:21 AM   HGBA1C 5.3 04/27/2018 08:12 AM    Last diabetic Eye exam: No results found for: HMDIABEYEEXA  Last diabetic Foot exam: No results found for: HMDIABFOOTEX   Lab Results  Component Value Date   CHOL 174 05/15/2020   HDL 57 05/15/2020   LDLCALC 101 (H) 05/15/2020   TRIG 69 05/15/2020   CHOLHDL 3.1 05/15/2020    Hepatic Function Latest Ref Rng & Units 05/15/2020 01/11/2020 08/18/2018  Total Protein 6.1 - 8.1 g/dL 6.8 7.2 7.2  Albumin 3.6 - 5.1 g/dL - - -  AST 10 - 35 U/L _0 ALT 6 - 29 U/L _1 Alk Phosphatase 33 - 130 U/L - - -  Total Bilirubin 0.2 - 1.2 mg/dL 0.5 0.7 0.6  Bilirubin, Direct 0.0 - 0.3 mg/dL - - -    Lab Results  Component Value Date/Time   TSH 2.40 04/10/2016 09:57 AM   TSH 4.928 (H) 10/04/2015 08:33 AM   FREET4 1.2 04/10/2016 09:57 AM   FREET4 0.94 10/04/2015 08:33 AM    CBC Latest  Ref Rng & Units 05/18/2020 05/15/2020 01/11/2020  WBC 3.8 - 10.8 Thousand/uL - 6.2 6.9  Hemoglobin 12.0 - 15.0 g/dL 13.6 13.6 13.8  Hematocrit 36.0 - 46.0 % 40.0 42.2 43.2  Platelets 140 - 400 Thousand/uL - 206 217    Lab Results  Component Value Date/Time   VD25OH 38 05/15/2020 09:21 AM   VD25OH 41 01/11/2020 09:26 AM    Clinical ASCVD: {YES/NO:21197} The 10-year ASCVD risk score Mikey Bussing DC Jr., et al., 2013) is: 9.6%   Values used to calculate the score:     Age: 73 years     Sex: Female     Is Non-Hispanic African American: No     Diabetic: No     Tobacco smoker: No     Systolic Blood Pressure: 767 mmHg     Is BP treated: Yes     HDL Cholesterol: 57 mg/dL     Total Cholesterol: 172 mg/dL    Depression screen [facility] 2/9 05/15/2020 01/11/2020 08/18/2018  Decreased Interest 1 0 2  Down, Depressed, Hopeless _2 PHQ - 2 Score _3 Altered sleeping 0 0 1  Tired, decreased energy 0 0 3  Change in appetite 0 0 2  Feeling bad or failure about yourself  2 0 3  Trouble concentrating 0 0 2  Moving slowly or fidgety/restless 0 0 2  Suicidal thoughts 0 0 1  PHQ-9 Score _0 Difficult doing work/chores Somewhat difficult Not difficult at all Very difficult  Some recent data might be hidden     ***Other: (CHADS2VASc if Afib, MMRC or CAT for COPD, ACT, DEXA)  Social History   Tobacco Use  Smoking Status Former Smoker  . Packs/day: 1.00  . Years: 20.00  . Pack years: 20.00  . Types: Cigarettes  . Quit date: 09/16/2009  . Years since quitting: 11.3  Smokeless Tobacco Never Used   BP Readings from Last 3 Encounters:  05/18/20 (!) 161/71  05/15/20 124/60  03/22/20 129/70   Pulse Readings from Last 3 Encounters:  05/18/20 (!) 54  05/15/20 72  03/22/20 60   Wt Readings from Last 3 Encounters:  05/18/20 163 lb (73.9 kg)  05/15/20 162 lb (73.5 kg)  03/22/20 164 lb (74.4 kg)   BMI Readings from Last 3 Encounters:  05/18/20 30.80 kg/m  05/15/20 30.61 kg/m  03/22/20 30.99  kg/m    Assessment/Interventions: Review of patient past medical history, allergies, medications, health status, including review of consultants reports, laboratory and other test data, was performed as part of comprehensive evaluation and provision of chronic care management services.   SDOH:  (Social Determinants of Health) assessments and interventions performed: {yes/no:20286}  SDOH Screenings   Alcohol Screen: Low Risk   . Last Alcohol Screening Score (AUDIT): 0  Depression (PHQ2-9): Medium Risk  . PHQ-2 Score: 5  Financial Resource Strain: Low Risk   . Difficulty of Paying Living Expenses: Not very hard  Food Insecurity: Not on file  Housing: Not on file  Physical Activity: Not on file  Social Connections: Not on file  Stress: Not on file  Tobacco Use: Medium Risk  . Smoking Tobacco Use: Former Smoker  . Smokeless Tobacco Use: Never Used  Transportation Needs: Not on file    CCM Care Plan  Allergies  Allergen Reactions  . Diclofenac Sodium Anaphylaxis  . Hydrocodone Itching and Nausea And Vomiting  . Lipitor [Atorvastatin Calcium] Other (See Comments)    Increased LFT's, myalgias  . Niacin Other (See Comments)    Flushing  . Other Other (See Comments)    Allergic to metal states body rejects it  . Zolpidem Other (See Comments)    Severe headache    Medications Reviewed Today    Reviewed by Edythe Clarity, [facility] (Pharmacist) on 07/18/20 at [place] List Status: <None>  Medication Order Taking? Sig Documenting Provider Last Dose Status Informant  ALPRAZolam (XANAX) 1 MG tablet [id number] Yes TAKE 1 TABLET BY MOUTH TWICE A DAY [place], Modena Nunnery, MD Taking Active   amLODipine (NORVASC) 10 MG tablet [id number] Yes TAKE 1 TABLET BY MOUTH EVERY DAY [place], Modena Nunnery, MD Taking Active Self  benazepril (LOTENSIN) 20 MG tablet [id number] Yes TAKE 1 TABLET BY MOUTH EVERY DAY  Patient taking differently: 10 mg. TAKE 1 TABLET BY MOUTH EVERY DAY   [place], Modena Nunnery, MD  Taking Active Self  cholecalciferol (VITAMIN D) 1000 units tablet [id number] Yes Take 2,000 Units by mouth 2 (two) times daily. [provider] Taking Active Self  DEXILANT 60 MG capsule [id number] Yes TAKE 1 CAPSULE BY MOUTH EVERY DAY  Patient taking differently: daily.    [place], Modena Nunnery, MD Taking Active Self  famotidine (PEPCID) 40 MG tablet [id number] Yes Take 1 tablet (40 mg total) by mouth at bedtime. [name], MD Taking  Active Self  ondansetron (ZOFRAN) 4 MG tablet [id number] Yes Take 1 tablet (4 mg total) by mouth every 8 (eight) hours as needed for nausea or vomiting. [name], MD Taking Active Self  ondansetron (ZOFRAN-ODT) 4 MG disintegrating tablet [id number] Yes TAKE 1 TABLET BY MOUTH EVERY 8 HOURS AS NEEDED FOR NAUSEA AND VOMITING [place], Modena Nunnery, MD Taking Active   PARoxetine (PAXIL) 40 MG tablet [id number] Yes Take 1 tablet (40 mg total) by mouth daily.  Patient taking differently: Take 40 mg by mouth at bedtime.    [place], Modena Nunnery, MD Taking Active Self  polyethylene glycol powder [facility]) 17 GM/SCOOP powder [id number] Yes Take 17 g by mouth 2 (two) times daily as needed. [place], Modena Nunnery, MD Taking Active   pravastatin (PRAVACHOL) 80 MG tablet [id number] Yes Take 1 tablet (80 mg total) by mouth daily.  Patient taking differently: Take 80 mg by mouth at bedtime.    [place], Modena Nunnery, MD Taking Active Self  traMADol (ULTRAM) 50 MG tablet [id number] No Take 1 tablet (50 mg total) by mouth every 6 (six) hours as needed.  Patient not taking: Reported on 71/02/9677   Leighton Ruff, MD Not Taking Consider Medication Status and Discontinue   triamcinolone cream (KENALOG) 0.1 % [id number] No APPLY TO AFFECTED AREA TWICE A DAY  Patient not taking: Reported on 07/18/2020   [name], MD Not Taking Consider Medication Status and Discontinue           Patient Active Problem List   Diagnosis Date Noted  . Primary osteoarthritis of left knee  03/09/2018  . Degenerative arthritis of left knee 03/06/2018  . Bilateral hearing loss 06/13/2017  . Pharyngoesophageal dysphagia 06/13/2017  . Depression 10/09/2015  . Generalized anxiety disorder 10/09/2015  . Insomnia 01/18/2015  . Family history of premature coronary artery disease 03/31/2013  . Obesity   . Hypertension 12/14/2010  . GERD (gastroesophageal reflux disease) 12/14/2010  . HLD (hyperlipidemia) 12/14/2010  . PTSD (post-traumatic stress disorder) 12/14/2010  . Glaucoma 12/14/2010  . Panic attacks 12/14/2010  . Vitamin D deficiency 12/14/2010  . HEMORRHOIDS 03/22/2010  . Constipation 03/22/2010    Immunization History  Administered Date(s) Administered  . Fluad Quad(high Dose 65+) 06/25/2019  . Influenza Split 11/07/2012  . Influenza Whole 06/17/2007  . Influenza,inj,Quad PF,6+ Mos 10/09/2015, 10/14/2016, 10/27/2017, 08/18/2018  . Pneumococcal Conjugate-13 01/11/2020  . Pneumococcal Polysaccharide-23 11/07/2012  . Tdap 04/14/2014  . Zoster 01/18/2015    Conditions to be addressed/monitored:  {USCCMDZASSESSMENTOPTIONS:23563}  There are no care plans that you recently modified to display for this patient.    Medication Assistance: {MEDASSISTANCEINFO:25044}  Patient's preferred pharmacy is:  [id number] [place]Phone: [phone]: [phone] Uses pill box? {Yes or If no, why not?:20788} Pt endorses ***% compliance  We discussed: {Pharmacy options:24294} Patient decided to: {US Pharmacy Plan:23885}  Care Plan and Follow Up Patient Decision:  {FOLLOWUP:24991}  Plan: {CM FOLLOW UP PLAN:25073}  ***

## 2021-06-29 ENCOUNTER — Other Ambulatory Visit: Payer: Self-pay | Admitting: *Deleted

## 2021-06-29 DIAGNOSIS — E785 Hyperlipidemia, unspecified: Secondary | ICD-10-CM

## 2021-06-29 MED ORDER — AMLODIPINE BESYLATE 10 MG PO TABS: 10.0000 mg | ORAL_TABLET | Freq: Every day | ORAL | 0 refills | Status: AC

## 2021-06-29 MED ORDER — PRAVASTATIN SODIUM 80 MG PO TABS: 80.0000 mg | ORAL_TABLET | Freq: Every day | ORAL | 0 refills | Status: AC

## 2021-09-22 ENCOUNTER — Other Ambulatory Visit: Payer: Self-pay | Admitting: Family Medicine

## 2021-10-26 ENCOUNTER — Other Ambulatory Visit: Payer: Self-pay

## 2021-10-26 NOTE — Telephone Encounter (Signed)
Pt no longer under care of [facility]

## 2021-11-26 ENCOUNTER — Other Ambulatory Visit: Payer: Self-pay | Admitting: Family Medicine

## 2021-11-26 DIAGNOSIS — E785 Hyperlipidemia, unspecified: Secondary | ICD-10-CM

## 2021-11-27 ENCOUNTER — Other Ambulatory Visit: Payer: Self-pay

## 2021-12-03 ENCOUNTER — Other Ambulatory Visit: Payer: Self-pay | Admitting: Family Medicine

## 2021-12-03 DIAGNOSIS — E785 Hyperlipidemia, unspecified: Secondary | ICD-10-CM

## 2022-01-02 ENCOUNTER — Emergency Department (HOSPITAL_COMMUNITY): Payer: Medicare Other

## 2022-01-02 ENCOUNTER — Other Ambulatory Visit: Payer: Self-pay

## 2022-01-02 ENCOUNTER — Inpatient Hospital Stay (HOSPITAL_COMMUNITY)
Admission: EM | Admit: 2022-01-02 | Discharge: 2022-01-05 | DRG: 394 | Disposition: A | Discharge status: Home / Self Care | Payer: Medicare Other | Attending: Internal Medicine | Admitting: Internal Medicine

## 2022-01-02 ENCOUNTER — Encounter (HOSPITAL_COMMUNITY): Payer: Self-pay

## 2022-01-02 DIAGNOSIS — F4310 Post-traumatic stress disorder, unspecified: Secondary | ICD-10-CM | POA: Diagnosis present

## 2022-01-02 DIAGNOSIS — Z96652 Presence of left artificial knee joint: Secondary | ICD-10-CM | POA: Diagnosis present

## 2022-01-02 DIAGNOSIS — R739 Hyperglycemia, unspecified: Secondary | ICD-10-CM | POA: Diagnosis present

## 2022-01-02 DIAGNOSIS — E669 Obesity, unspecified: Secondary | ICD-10-CM | POA: Diagnosis present

## 2022-01-02 DIAGNOSIS — R1084 Generalized abdominal pain: Principal | ICD-10-CM

## 2022-01-02 DIAGNOSIS — F419 Anxiety disorder, unspecified: Secondary | ICD-10-CM | POA: Diagnosis present

## 2022-01-02 DIAGNOSIS — G4733 Obstructive sleep apnea (adult) (pediatric): Secondary | ICD-10-CM | POA: Diagnosis present

## 2022-01-02 DIAGNOSIS — K559 Vascular disorder of intestine, unspecified: Secondary | ICD-10-CM | POA: Diagnosis not present

## 2022-01-02 DIAGNOSIS — M549 Dorsalgia, unspecified: Secondary | ICD-10-CM | POA: Diagnosis present

## 2022-01-02 DIAGNOSIS — I7090 Unspecified atherosclerosis: Secondary | ICD-10-CM | POA: Diagnosis present

## 2022-01-02 DIAGNOSIS — G8929 Other chronic pain: Secondary | ICD-10-CM | POA: Diagnosis present

## 2022-01-02 DIAGNOSIS — D649 Anemia, unspecified: Secondary | ICD-10-CM | POA: Diagnosis not present

## 2022-01-02 DIAGNOSIS — F32A Depression, unspecified: Secondary | ICD-10-CM | POA: Diagnosis present

## 2022-01-02 DIAGNOSIS — E782 Mixed hyperlipidemia: Secondary | ICD-10-CM

## 2022-01-02 DIAGNOSIS — M47816 Spondylosis without myelopathy or radiculopathy, lumbar region: Secondary | ICD-10-CM | POA: Diagnosis present

## 2022-01-02 DIAGNOSIS — Z6831 Body mass index (BMI) 31.0-31.9, adult: Secondary | ICD-10-CM

## 2022-01-02 DIAGNOSIS — E876 Hypokalemia: Secondary | ICD-10-CM

## 2022-01-02 DIAGNOSIS — Z8249 Family history of ischemic heart disease and other diseases of the circulatory system: Secondary | ICD-10-CM

## 2022-01-02 DIAGNOSIS — H409 Unspecified glaucoma: Secondary | ICD-10-CM | POA: Diagnosis present

## 2022-01-02 DIAGNOSIS — E860 Dehydration: Secondary | ICD-10-CM | POA: Diagnosis present

## 2022-01-02 DIAGNOSIS — I739 Peripheral vascular disease, unspecified: Secondary | ICD-10-CM | POA: Diagnosis present

## 2022-01-02 DIAGNOSIS — E872 Acidosis, unspecified: Secondary | ICD-10-CM | POA: Diagnosis present

## 2022-01-02 DIAGNOSIS — K219 Gastro-esophageal reflux disease without esophagitis: Secondary | ICD-10-CM | POA: Diagnosis present

## 2022-01-02 DIAGNOSIS — Z9081 Acquired absence of spleen: Secondary | ICD-10-CM

## 2022-01-02 DIAGNOSIS — Z87891 Personal history of nicotine dependence: Secondary | ICD-10-CM

## 2022-01-02 DIAGNOSIS — Z885 Allergy status to narcotic agent status: Secondary | ICD-10-CM

## 2022-01-02 DIAGNOSIS — Z79899 Other long term (current) drug therapy: Secondary | ICD-10-CM

## 2022-01-02 DIAGNOSIS — A419 Sepsis, unspecified organism: Secondary | ICD-10-CM

## 2022-01-02 DIAGNOSIS — Z981 Arthrodesis status: Secondary | ICD-10-CM

## 2022-01-02 DIAGNOSIS — K5909 Other constipation: Secondary | ICD-10-CM | POA: Diagnosis present

## 2022-01-02 DIAGNOSIS — E279 Disorder of adrenal gland, unspecified: Secondary | ICD-10-CM

## 2022-01-02 DIAGNOSIS — Z888 Allergy status to other drugs, medicaments and biological substances status: Secondary | ICD-10-CM

## 2022-01-02 DIAGNOSIS — I10 Essential (primary) hypertension: Secondary | ICD-10-CM

## 2022-01-02 DIAGNOSIS — E559 Vitamin D deficiency, unspecified: Secondary | ICD-10-CM | POA: Diagnosis present

## 2022-01-02 LAB — COMPREHENSIVE METABOLIC PANEL
ALT: 15 U/L (ref 0–44)
AST: 27 U/L (ref 15–41)
Albumin: 4.4 g/dL (ref 3.5–5.0)
Alkaline Phosphatase: 86 U/L (ref 38–126)
Anion gap: 10 (ref 5–15)
BUN: 23 mg/dL (ref 8–23)
CO2: 25 mmol/L (ref 22–32)
Calcium: 9.8 mg/dL (ref 8.9–10.3)
Chloride: 105 mmol/L (ref 98–111)
Creatinine, Ser: 0.81 mg/dL (ref 0.44–1.00)
GFR, Estimated: >60 mL/min (ref >60)
Glucose, Bld: 216 mg/dL — ABNORMAL HIGH (ref 70–99)
Potassium: 3.3 mmol/L — ABNORMAL LOW (ref 3.5–5.1)
Sodium: 140 mmol/L (ref 135–145)
Total Bilirubin: 0.9 mg/dL (ref 0.3–1.2)
Total Protein: 8.1 g/dL (ref 6.5–8.1)

## 2022-01-02 LAB — URINALYSIS, ROUTINE W REFLEX MICROSCOPIC
Bilirubin Urine: NEGATIVE
Glucose, UA: NEGATIVE mg/dL
Ketones, ur: 5 mg/dL — AB
Leukocytes,Ua: NEGATIVE
Nitrite: NEGATIVE
Protein, ur: 30 mg/dL — AB
Specific Gravity, Urine: 1.021 (ref 1.005–1.030)
pH: 5 (ref 5.0–8.0)

## 2022-01-02 LAB — DIFFERENTIAL
Abs Immature Granulocytes: 0.11 10*3/uL — ABNORMAL HIGH (ref 0.00–0.07)
Basophils Absolute: 0.1 10*3/uL (ref 0.0–0.1)
Basophils Relative: 0 %
Eosinophils Absolute: 0 10*3/uL (ref 0.0–0.5)
Eosinophils Relative: 0 %
Immature Granulocytes: 1 %
Lymphocytes Relative: 2 %
Lymphs Abs: 0.5 10*3/uL — ABNORMAL LOW (ref 0.7–4.0)
Monocytes Absolute: 2 10*3/uL — ABNORMAL HIGH (ref 0.1–1.0)
Monocytes Relative: 9 %
Neutro Abs: 20.4 10*3/uL — ABNORMAL HIGH (ref 1.7–7.7)
Neutrophils Relative %: 88 %

## 2022-01-02 LAB — CBC
HCT: 46.4 % — ABNORMAL HIGH (ref 36.0–46.0)
Hemoglobin: 15.4 g/dL — ABNORMAL HIGH (ref 12.0–15.0)
MCH: 30.6 pg (ref 26.0–34.0)
MCHC: 33.2 g/dL (ref 30.0–36.0)
MCV: 92.2 fL (ref 80.0–100.0)
Platelets: 228 10*3/uL (ref 150–400)
RBC: 5.03 MIL/uL (ref 3.87–5.11)
RDW: 12.7 % (ref 11.5–15.5)
WBC: 20.7 10*3/uL — ABNORMAL HIGH (ref 4.0–10.5)
nRBC: 0 % (ref 0.0–0.2)

## 2022-01-02 LAB — LIPASE, BLOOD: Lipase: 34 U/L (ref 11–51)

## 2022-01-02 LAB — LACTIC ACID, PLASMA: Lactic Acid, Venous: 3.2 mmol/L (ref 0.5–1.9)

## 2022-01-02 MED ORDER — HYDROMORPHONE HCL 1 MG/ML IJ SOLN
0.5000 mg | Freq: Once | INTRAMUSCULAR | Status: AC
  Administered 2022-01-03: 0.5 mg via INTRAVENOUS
  Filled 2022-01-02: qty 0.5

## 2022-01-02 MED ORDER — SODIUM CHLORIDE 0.9 % IV SOLN
1000.0000 mL | INTRAVENOUS | Status: DC
  Administered 2022-01-03 – 2022-01-05 (×5): 1000 mL via INTRAVENOUS

## 2022-01-02 MED ORDER — ONDANSETRON HCL 4 MG/2ML IJ SOLN
4.0000 mg | Freq: Once | INTRAMUSCULAR | Status: AC
  Administered 2022-01-03: 4 mg via INTRAVENOUS
  Filled 2022-01-02: qty 2

## 2022-01-02 MED ORDER — SODIUM CHLORIDE 0.9 % IV BOLUS (SEPSIS)
1000.0000 mL | Freq: Once | INTRAVENOUS | Status: AC
Start: 2022-01-02 — End: 2022-01-03
  Administered 2022-01-02: 1000 mL via INTRAVENOUS

## 2022-01-02 MED ORDER — SODIUM CHLORIDE 0.9 % IV SOLN: INTRAVENOUS | Status: DC

## 2022-01-02 MED ORDER — IOHEXOL 300 MG/ML  SOLN
100.0000 mL | Freq: Once | INTRAMUSCULAR | Status: AC | PRN
  Administered 2022-01-02: 100 mL via INTRAVENOUS

## 2022-01-02 MED ORDER — LACTATED RINGERS IV BOLUS (SEPSIS): 250.0000 mL | Freq: Once | INTRAVENOUS | Status: DC

## 2022-01-02 MED ORDER — LACTATED RINGERS IV BOLUS (SEPSIS): 1000.0000 mL | Freq: Once | INTRAVENOUS | Status: DC

## 2022-01-02 MED ORDER — SODIUM CHLORIDE 0.9 % IV SOLN
2.0000 g | Freq: Once | INTRAVENOUS | Status: AC
  Administered 2022-01-02: 2 g via INTRAVENOUS
  Filled 2022-01-02: qty 12.5

## 2022-01-02 MED ORDER — LACTATED RINGERS IV SOLN: INTRAVENOUS | Status: DC

## 2022-01-02 MED ORDER — METRONIDAZOLE 500 MG/100ML IV SOLN
500.0000 mg | Freq: Once | INTRAVENOUS | Status: AC
  Administered 2022-01-03: 500 mg via INTRAVENOUS
  Filled 2022-01-02: qty 100

## 2022-01-02 MED ORDER — VANCOMYCIN HCL IN DEXTROSE 1-5 GM/200ML-% IV SOLN
1000.0000 mg | Freq: Once | INTRAVENOUS | Status: AC
  Administered 2022-01-03: 1000 mg via INTRAVENOUS
  Filled 2022-01-02: qty 200

## 2022-01-02 MED ORDER — SODIUM CHLORIDE 0.9 % IV BOLUS (SEPSIS)
1000.0000 mL | Freq: Once | INTRAVENOUS | Status: AC
  Administered 2022-01-02: 1000 mL via INTRAVENOUS

## 2022-01-02 NOTE — ED Notes (Signed)
Blood cultures drawn by lab

## 2022-01-02 NOTE — ED Provider Notes (Addendum)
[facility] EMERGENCY DEPARTMENT Provider Note   CSN: [id number] Arrival date & time: 01/02/22  1600     History  Chief Complaint  Patient presents with   Abdominal Pain    And constipation    Lynn [name] is a 71 y.o. female.  Patient with a complaint of abdominal pain for several days.  Been worse the past 2 days.  With cramping and she has not had a bowel movement she thinks she has constipation.  No vomiting but has had dry heaves and has been nauseated.  Denies fever but feels warm.  Here temp 97.2 heart rate 115 blood pressure 175/92 respirations 20 oxygen sats 93%.  Past medical history is significant for anxiety hypertension gastroesophageal reflux disease hyperlipidemia obesity hyperglycemia posttraumatic stress disorder depression chronic back pain peripheral vascular disease.  Past history surgical history significant for total splenectomy.  Incisional hernia repair in 2018 cardiac catheterization 2009 patient quit smoking in 2011.  Last time seen in the emergency department was November 2018.      Home Medications Prior to Admission medications   Medication Sig Start Date End Date Taking? Authorizing Provider  PARoxetine (PAXIL) 40 MG tablet TAKE 1 TABLET BY MOUTH EVERY DAY 09/04/20   [place], Velna Hatchet, MD  ALPRAZolam Prudy Feeler) 1 MG tablet Take 1 tablet (1 mg total) by mouth 2 (two) times daily. 11/22/20   Salley Scarlet, MD  amLODipine (NORVASC) 10 MG tablet Take 1 tablet (10 mg total) by mouth daily. 06/29/21   Donita Brooks, MD  benazepril (LOTENSIN) 10 MG tablet Take 10 mg by mouth every morning. 10/21/21   [provider]  cholecalciferol (VITAMIN D) 1000 units tablet Take 2,000 Units by mouth 2 (two) times daily.    [provider]  DEXILANT 60 MG capsule TAKE 1 CAPSULE BY MOUTH EVERY DAY 08/25/20   Salley Scarlet, MD  famotidine (PEPCID) 20 MG tablet Take by mouth. 08/23/21   [provider]  latanoprost (XALATAN) 0.005 %  ophthalmic solution 1 drop at bedtime. 10/23/21   [provider]  ondansetron (ZOFRAN) 4 MG tablet Take 1 tablet (4 mg total) by mouth every 8 (eight) hours as needed for nausea or vomiting. 11/01/20   [place], Velna Hatchet, MD  ondansetron (ZOFRAN-ODT) 4 MG disintegrating tablet TAKE 1 TABLET BY MOUTH EVERY 8 HOURS AS NEEDED FOR NAUSEA AND VOMITING 05/30/20   Salley Scarlet, MD  polyethylene glycol powder [facility]) 17 GM/SCOOP powder Take 17 g by mouth 2 (two) times daily as needed. 05/15/20   Salley Scarlet, MD  pravastatin (PRAVACHOL) 80 MG tablet Take 1 tablet (80 mg total) by mouth daily. 06/29/21   Donita Brooks, MD  traMADol (ULTRAM) 50 MG tablet Take 1 tablet (50 mg total) by mouth every 6 (six) hours as needed. Patient not taking: Reported on 07/18/2020 05/18/20   Romie Levee, MD  triamcinolone cream (KENALOG) 0.1 % APPLY TO AFFECTED AREA TWICE A DAY Patient not taking: Reported on 07/18/2020 07/03/20   Salley Scarlet, MD      Allergies    Diclofenac sodium, Hydrocodone, Lipitor [atorvastatin calcium], Niacin, Other, and Zolpidem    Review of Systems   Review of Systems  Constitutional:  Negative for chills and fever.  HENT:  Negative for ear pain and sore throat.   Eyes:  Negative for pain and visual disturbance.  Respiratory:  Negative for cough and shortness of breath.   Cardiovascular:  Negative for chest pain and palpitations.  Gastrointestinal:  Positive for abdominal pain, nausea and vomiting.  Genitourinary:  Negative for dysuria and hematuria.  Musculoskeletal:  Negative for arthralgias and back pain.  Skin:  Negative for color change and rash.  Neurological:  Negative for seizures and syncope.  All other systems reviewed and are negative.  Physical Exam Updated Vital Signs BP (!) 149/83   Pulse (!) 105   Temp 98.5 F (36.9 C) (Oral)   Resp (!) 21   Ht 1.549 m (5\' 1" )   Wt 74.8 kg   SpO2 96%   BMI 31.18 kg/m  Physical Exam Vitals and  nursing note reviewed.  Constitutional:      General: She is not in acute distress.    Appearance: Normal appearance. She is well-developed.  HENT:     Head: Normocephalic and atraumatic.  Eyes:     Extraocular Movements: Extraocular movements intact.     Conjunctiva/sclera: Conjunctivae normal.     Pupils: Pupils are equal, round, and reactive to light.  Cardiovascular:     Rate and Rhythm: Normal rate and regular rhythm.     Heart sounds: No murmur heard. Pulmonary:     Effort: Pulmonary effort is normal. No respiratory distress.     Breath sounds: Normal breath sounds. No wheezing, rhonchi or rales.  Abdominal:     Palpations: Abdomen is soft.     Tenderness: There is abdominal tenderness. There is no guarding.  Musculoskeletal:        General: No swelling.     Cervical back: Neck supple.  Skin:    General: Skin is warm and dry.     Capillary Refill: Capillary refill takes less than 2 seconds.  Neurological:     General: No focal deficit present.     Mental Status: She is alert and oriented to person, place, and time.  Psychiatric:        Mood and Affect: Mood normal.    ED Results / Procedures / Treatments   Labs (all labs ordered are listed, but only abnormal results are displayed) Labs Reviewed  COMPREHENSIVE METABOLIC PANEL - Abnormal; Notable for the following components:      Result Value   Potassium 3.3 (*)    Glucose, Bld 216 (*)    All other components within normal limits  CBC - Abnormal; Notable for the following components:   WBC 20.7 (*)    Hemoglobin 15.4 (*)    HCT 46.4 (*)    All other components within normal limits  URINALYSIS, ROUTINE W REFLEX MICROSCOPIC - Abnormal; Notable for the following components:   Color, Urine AMBER (*)    Hgb urine dipstick SMALL (*)    Ketones, ur 5 (*)    Protein, ur 30 (*)    Bacteria, UA RARE (*)    All other components within normal limits  LACTIC ACID, PLASMA - Abnormal; Notable for the following components:    Lactic Acid, Venous 3.2 (*)    All other components within normal limits  DIFFERENTIAL - Abnormal; Notable for the following components:   Neutro Abs 20.4 (*)    Lymphs Abs 0.5 (*)    Monocytes Absolute 2.0 (*)    Abs Immature Granulocytes 0.11 (*)    All other components within normal limits  CULTURE, BLOOD (ROUTINE X 2)  CULTURE, BLOOD (ROUTINE X 2)  LIPASE, BLOOD  LACTIC ACID, PLASMA    EKG EKG Interpretation  Date/Time:  Wednesday January 02 2022 22:20:58 EDT Ventricular Rate:  102 PR Interval:  157 QRS Duration: 97 QT Interval:  366 QTC Calculation: 477 R Axis:   77 Text Interpretation: Sinus tachycardia Atrial premature complexes Borderline T abnormalities, anterior leads Baseline wander in lead(s) I II aVR Confirmed by Vanetta Mulders [phone]) on 01/02/2022 10:35:31 PM  Radiology DG Abdomen 1 View  Result Date: 01/02/2022 CLINICAL DATA:  Constipation and abdominal pain EXAM: ABDOMEN - 1 VIEW COMPARISON:  None. FINDINGS: The bowel gas pattern is normal. No radio-opaque calculi or other significant radiographic abnormality are seen. IMPRESSION: Negative. Electronically Signed   By: Deatra Robinson [name].   On: 01/02/2022 19:54   CT Abdomen Pelvis W Contrast  Result Date: 01/02/2022 CLINICAL DATA:  Abdominal pain, acute, nonlocalized EXAM: CT ABDOMEN AND PELVIS WITH CONTRAST TECHNIQUE: Multidetector CT imaging of the abdomen and pelvis was performed using the standard protocol following bolus administration of intravenous contrast. RADIATION DOSE REDUCTION: This exam was performed according to the departmental dose-optimization program which includes automated exposure control, adjustment of the mA and/or kV according to patient size and/or use of iterative reconstruction technique. CONTRAST:  OMNIPAQUE IOHEXOL 300 MG/ML  SOLN COMPARISON:  10/01/2019 FINDINGS: Lower chest: Small atrial septal defect (image # 8/2). Calcification of the aortic valve leaflets noted. Cardiac size  is within normal limits. Visualized lung bases are clear. Hepatobiliary: No focal liver abnormality is seen. No gallstones, gallbladder wall thickening, or biliary dilatation. Pancreas: Unremarkable Spleen: The spleen appears relatively small and multilobulated with prominent clefts which may reflect the sequela of remote infarction or trauma. Normal enhancement of the residual spleen. Adrenals/Urinary Tract: The adrenal glands are slightly thickened and enlarged when compared to prior examination. The glands demonstrate preserved enhancement with slight washout on delayed images. There is extensive periadrenal inflammatory stranding. Altogether, these findings may reflect changes of adrenal congestion, a finding that may proceed nontraumatic adrenal hemorrhage. The kidneys are normal.  The bladder is unremarkable. Stomach/Bowel: There is mild pericolonic inflammatory stranding and subtle circumferential bowel wall thickening involving the descending and proximal sigmoid colon which may reflect changes of acute infectious, inflammatory, or ischemic colitis. The stomach, small bowel, and large bowel are otherwise unremarkable. No obstruction. Appendix normal. No free intraperitoneal gas or fluid. Vascular/Lymphatic: Moderate aortoiliac atherosclerotic calcification. No aortic aneurysm. The renal veins, suprarenal inferior vena cava, and left adrenal vein are all patent. No pathologic adenopathy within the abdomen and pelvis. Reproductive: Uterus and bilateral adnexa are unremarkable. Other: No abdominal wall hernia. Musculoskeletal: Left hip ORIF has been performed. No acute bone abnormality. L2-L5 fusion with instrumentation has been performed. IMPRESSION: Thickening and hyperemia of the adrenal glands with preserved perfusion with extensive periadrenal inflammatory change. This may reflect changes of adrenal congestion and may proceed nontraumatic adrenal hemorrhage. Correlation with laboratory examination may be  helpful for further evaluation. Inflammatory changes involving the descending and sigmoid colon in keeping with changes of infectious, inflammatory, or ischemic colitis. Of note, the superior and inferior mesenteric arteries and veins are patent proximally. Small atrial septal defect. Calcification of the aortic valve leaflets. Echocardiography may be helpful to assess the degree of valvular pathology as well as confirm the presence of a septal defect. Aortic Atherosclerosis (ICD10-I70.0). Electronically Signed   By: Helyn Numbers [name].   On: 01/02/2022 22:34    Procedures Procedures    Medications Ordered in ED Medications  0.9 %  sodium chloride infusion ( Intravenous New Bag/Given 01/02/22 2222)  lactated ringers infusion (has no administration in time range)  lactated ringers bolus 1,000 mL (has no administration  in time range)    And  lactated ringers bolus 1,000 mL (has no administration in time range)    And  lactated ringers bolus 250 mL (has no administration in time range)  ceFEPIme (MAXIPIME) 2 g in sodium chloride 0.9 % 100 mL IVPB (has no administration in time range)  metroNIDAZOLE (FLAGYL) IVPB 500 mg (has no administration in time range)  vancomycin (VANCOCIN) IVPB 1000 mg/200 mL premix (has no administration in time range)  iohexol (OMNIPAQUE) 300 MG/ML solution 100 mL (100 mLs Intravenous Contrast Given 01/02/22 2153)    ED Course/ Medical Decision Making/ A&P                           Medical Decision Making Amount and/or Complexity of Data Reviewed Labs: ordered. Radiology: ordered.  Risk Prescription drug management. Decision regarding hospitalization.    CRITICAL CARE Performed by: Vanetta Mulders Total critical care time: 45 minutes Critical care time was exclusive of separately billable procedures and treating other patients. Critical care was necessary to treat or prevent imminent or life-threatening deterioration. Critical care was time spent personally  by me on the following activities: development of treatment plan with patient and/or surrogate as well as nursing, discussions with consultants, evaluation of patient's response to treatment, examination of patient, obtaining history from patient or surrogate, ordering and performing treatments and interventions, ordering and review of laboratory studies, ordering and review of radiographic studies, pulse oximetry and re-evaluation of patient's condition.  Patient feels warm.  Patient temp originally was not elevated we will have nursing recheck it.  Also she has had some chills here.  Did not describe them at home.  Patient with significant leukocytosis needs differential to break that down.  Patient also has diffuse abdominal pain although acute abdominal series was negative for evidence of bowel obstruction or evidence of any significant constipation.  Patient needs CT scan abdomen and pelvis.  Patient's complete metabolic panel potassium slightly down at 3.3 glucose 216 bilirubin normal renal function normal liver function test normal white count as we mention hemoglobin is 15.4.  Patient has blood cultures pending.  Lactic acid came back at 3.2.  Urinalysis negative.  Lipase normal complete metabolic panel with a potassium of 3.3 as mentioned.  Differential still pending.  CT scan of the abdomen and raises concerns for inflammatory changes involving the descending and sigmoid colon in keeping with changes of infectious inflammatory ischemic colitis.  Of note the superior and inferior mesenteric arteries and veins are patent proximately.  Patient certainly based on her past history could have ischemic bowel.  Lactic acid is elevated some.  We will start on sepsis protocol based on this white count and the lactic acid and will get CTA abdomen to rule out ischemic bowel.  We will start on broad-spectrum antibiotics.  Patient since she just had CT scan abdomen pelvis with contrast will have to wait 12  hours to have scan done for CTA.  Based on this we will get hospitalist to admit.  Discussed with on-call general surgery they are aware.  And will see patient for consultation.  Symptomatic treatment for now.  From their standpoint.   Final Clinical Impression(s) / ED Diagnoses Final diagnoses:  Generalized abdominal pain  Sepsis, due to unspecified organism, unspecified whether acute organ dysfunction present [facility])    Rx / DC Orders ED Discharge Orders     None         Vanetta Mulders,  MD 01/02/22 2150    Vanetta Mulders, MD 01/02/22 2130    Vanetta Mulders, MD 01/02/22 [phone]

## 2022-01-02 NOTE — ED Notes (Signed)
Pt given a third cup of water- tolerating well ?

## 2022-01-02 NOTE — Sepsis Progress Note (Signed)
Monitoring for the code sepsis protocol. °

## 2022-01-02 NOTE — ED Triage Notes (Signed)
Patient via EMS from home due cramping from constipation for 3-4 days.  ?

## 2022-01-02 NOTE — ED Notes (Signed)
Pt given water per request

## 2022-01-03 DIAGNOSIS — E860 Dehydration: Secondary | ICD-10-CM

## 2022-01-03 DIAGNOSIS — E876 Hypokalemia: Secondary | ICD-10-CM | POA: Diagnosis present

## 2022-01-03 DIAGNOSIS — G4733 Obstructive sleep apnea (adult) (pediatric): Secondary | ICD-10-CM | POA: Diagnosis present

## 2022-01-03 DIAGNOSIS — E782 Mixed hyperlipidemia: Secondary | ICD-10-CM | POA: Diagnosis present

## 2022-01-03 DIAGNOSIS — G8929 Other chronic pain: Secondary | ICD-10-CM | POA: Diagnosis present

## 2022-01-03 DIAGNOSIS — I7090 Unspecified atherosclerosis: Secondary | ICD-10-CM | POA: Diagnosis present

## 2022-01-03 DIAGNOSIS — E669 Obesity, unspecified: Secondary | ICD-10-CM | POA: Diagnosis present

## 2022-01-03 DIAGNOSIS — Z87891 Personal history of nicotine dependence: Secondary | ICD-10-CM | POA: Diagnosis not present

## 2022-01-03 DIAGNOSIS — K559 Vascular disorder of intestine, unspecified: Secondary | ICD-10-CM

## 2022-01-03 DIAGNOSIS — E279 Disorder of adrenal gland, unspecified: Secondary | ICD-10-CM

## 2022-01-03 DIAGNOSIS — F4310 Post-traumatic stress disorder, unspecified: Secondary | ICD-10-CM | POA: Diagnosis present

## 2022-01-03 DIAGNOSIS — E559 Vitamin D deficiency, unspecified: Secondary | ICD-10-CM | POA: Diagnosis present

## 2022-01-03 DIAGNOSIS — M47816 Spondylosis without myelopathy or radiculopathy, lumbar region: Secondary | ICD-10-CM | POA: Diagnosis present

## 2022-01-03 DIAGNOSIS — E872 Acidosis, unspecified: Secondary | ICD-10-CM | POA: Diagnosis present

## 2022-01-03 DIAGNOSIS — K5909 Other constipation: Secondary | ICD-10-CM | POA: Diagnosis present

## 2022-01-03 DIAGNOSIS — Z96652 Presence of left artificial knee joint: Secondary | ICD-10-CM | POA: Diagnosis present

## 2022-01-03 DIAGNOSIS — I10 Essential (primary) hypertension: Secondary | ICD-10-CM | POA: Diagnosis present

## 2022-01-03 DIAGNOSIS — R1084 Generalized abdominal pain: Secondary | ICD-10-CM

## 2022-01-03 DIAGNOSIS — F419 Anxiety disorder, unspecified: Secondary | ICD-10-CM | POA: Diagnosis present

## 2022-01-03 DIAGNOSIS — K219 Gastro-esophageal reflux disease without esophagitis: Secondary | ICD-10-CM

## 2022-01-03 DIAGNOSIS — K529 Noninfective gastroenteritis and colitis, unspecified: Secondary | ICD-10-CM | POA: Diagnosis not present

## 2022-01-03 DIAGNOSIS — Z9081 Acquired absence of spleen: Secondary | ICD-10-CM | POA: Diagnosis not present

## 2022-01-03 DIAGNOSIS — M549 Dorsalgia, unspecified: Secondary | ICD-10-CM | POA: Diagnosis present

## 2022-01-03 DIAGNOSIS — D72829 Elevated white blood cell count, unspecified: Secondary | ICD-10-CM

## 2022-01-03 DIAGNOSIS — I739 Peripheral vascular disease, unspecified: Secondary | ICD-10-CM | POA: Diagnosis present

## 2022-01-03 DIAGNOSIS — R739 Hyperglycemia, unspecified: Secondary | ICD-10-CM | POA: Diagnosis present

## 2022-01-03 DIAGNOSIS — F32A Depression, unspecified: Secondary | ICD-10-CM | POA: Diagnosis present

## 2022-01-03 DIAGNOSIS — H409 Unspecified glaucoma: Secondary | ICD-10-CM | POA: Diagnosis present

## 2022-01-03 LAB — PHOSPHORUS: Phosphorus: 1.7 mg/dL — ABNORMAL LOW (ref 2.5–4.6)

## 2022-01-03 LAB — LACTIC ACID, PLASMA
Lactic Acid, Venous: 0.6 mmol/L (ref 0.5–1.9)
Lactic Acid, Venous: 2.4 mmol/L (ref 0.5–1.9)

## 2022-01-03 LAB — CBC
HCT: 37.3 % (ref 36.0–46.0)
Hemoglobin: 12.1 g/dL (ref 12.0–15.0)
MCH: 30 pg (ref 26.0–34.0)
MCHC: 32.4 g/dL (ref 30.0–36.0)
MCV: 92.3 fL (ref 80.0–100.0)
Platelets: 181 10*3/uL (ref 150–400)
RBC: 4.04 MIL/uL (ref 3.87–5.11)
RDW: 12.8 % (ref 11.5–15.5)
WBC: 21.7 10*3/uL — ABNORMAL HIGH (ref 4.0–10.5)
nRBC: 0 % (ref 0.0–0.2)

## 2022-01-03 LAB — MAGNESIUM: Magnesium: 2 mg/dL (ref 1.7–2.4)

## 2022-01-03 LAB — CREATININE, SERUM
Creatinine, Ser: 0.81 mg/dL (ref 0.44–1.00)
GFR, Estimated: >60 mL/min (ref >60)

## 2022-01-03 LAB — HIV ANTIBODY (ROUTINE TESTING W REFLEX): HIV Screen 4th Generation wRfx: NONREACTIVE

## 2022-01-03 MED ORDER — HYDROMORPHONE HCL 1 MG/ML IJ SOLN
0.5000 mg | INTRAMUSCULAR | Status: DC | PRN
  Administered 2022-01-03: 0.5 mg via INTRAVENOUS
  Filled 2022-01-03: qty 0.5

## 2022-01-03 MED ORDER — ALPRAZOLAM 1 MG PO TABS
1.0000 mg | ORAL_TABLET | Freq: Two times a day (BID) | ORAL | Status: DC
  Administered 2022-01-03 – 2022-01-05 (×4): 1 mg via ORAL
  Filled 2022-01-03 (×4): qty 1

## 2022-01-03 MED ORDER — VANCOMYCIN HCL 750 MG/150ML IV SOLN: 750.0000 mg | INTRAVENOUS | Status: DC

## 2022-01-03 MED ORDER — ONDANSETRON HCL 4 MG/2ML IJ SOLN: 4.0000 mg | Freq: Four times a day (QID) | INTRAMUSCULAR | Status: DC | PRN

## 2022-01-03 MED ORDER — PIPERACILLIN-TAZOBACTAM 3.375 G IVPB 30 MIN
3.3750 g | Freq: Once | INTRAVENOUS | Status: AC
  Administered 2022-01-03: 3.375 g via INTRAVENOUS
  Filled 2022-01-03: qty 50

## 2022-01-03 MED ORDER — PANTOPRAZOLE SODIUM 40 MG PO TBEC
40.0000 mg | DELAYED_RELEASE_TABLET | Freq: Every day | ORAL | Status: DC
  Administered 2022-01-03 – 2022-01-05 (×3): 40 mg via ORAL
  Filled 2022-01-03 (×3): qty 1

## 2022-01-03 MED ORDER — PIPERACILLIN-TAZOBACTAM 3.375 G IVPB
3.3750 g | Freq: Three times a day (TID) | INTRAVENOUS | Status: DC
Start: 2022-01-03 — End: 2022-01-05
  Administered 2022-01-03 – 2022-01-04 (×5): 3.375 g via INTRAVENOUS
  Filled 2022-01-03 (×5): qty 50

## 2022-01-03 MED ORDER — AMLODIPINE BESYLATE 5 MG PO TABS
10.0000 mg | ORAL_TABLET | Freq: Every day | ORAL | Status: DC
  Administered 2022-01-03 – 2022-01-05 (×3): 10 mg via ORAL
  Filled 2022-01-03 (×3): qty 2

## 2022-01-03 MED ORDER — POTASSIUM CHLORIDE CRYS ER 20 MEQ PO TBCR
40.0000 meq | EXTENDED_RELEASE_TABLET | Freq: Once | ORAL | Status: AC
  Administered 2022-01-03: 40 meq via ORAL
  Filled 2022-01-03: qty 2

## 2022-01-03 MED ORDER — ENOXAPARIN SODIUM 40 MG/0.4ML IJ SOSY
40.0000 mg | PREFILLED_SYRINGE | INTRAMUSCULAR | Status: DC
  Administered 2022-01-03 – 2022-01-04 (×2): 40 mg via SUBCUTANEOUS
  Filled 2022-01-03 (×3): qty 0.4

## 2022-01-03 MED ORDER — PRAVASTATIN SODIUM 40 MG PO TABS
80.0000 mg | ORAL_TABLET | Freq: Every day | ORAL | Status: DC
  Administered 2022-01-03 – 2022-01-05 (×3): 80 mg via ORAL
  Filled 2022-01-03 (×3): qty 2

## 2022-01-03 MED ORDER — SODIUM CHLORIDE 0.9 % IV SOLN: 2.0000 g | Freq: Two times a day (BID) | INTRAVENOUS | Status: DC

## 2022-01-03 MED ORDER — PAROXETINE HCL 20 MG PO TABS
40.0000 mg | ORAL_TABLET | Freq: Every day | ORAL | Status: DC
  Administered 2022-01-03 – 2022-01-05 (×3): 40 mg via ORAL
  Filled 2022-01-03 (×3): qty 2

## 2022-01-03 MED ORDER — BENAZEPRIL HCL 10 MG PO TABS
10.0000 mg | ORAL_TABLET | Freq: Every morning | ORAL | Status: DC
  Administered 2022-01-04 – 2022-01-05 (×2): 10 mg via ORAL
  Filled 2022-01-03 (×2): qty 1

## 2022-01-03 NOTE — Assessment & Plan Note (Signed)
BMI 31.18 ?Lifestyle modification ?

## 2022-01-03 NOTE — Assessment & Plan Note (Addendum)
GI consult appreciated>>continue current medical therapy ?Continue empiric Zosyn>>clinically improving ?Lactic acid peaked at 3.2>> 2.4>>0.6 ?Clear liquid diet>>full liquids>>soft diet which she tolerated ?Continue IV fluids ?D/c home with amox/clav x 5 more days ?
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
## 2022-01-03 NOTE — Hospital Course (Addendum)
70 year old female with a history of hypertension, PTSD, hyperlipidemia, depression, GERD, hyperlipidemia presenting with 2-week history of upper abdominal pain that has worsened over the past 2 days.  She has had intermittent nausea and vomiting and dry heaving.  She has not had any diarrhea, hematochezia, melena.  She states that she has felt somewhat constipated.  She had small bowel movement prior to coming to the hospital on 01/02/2022, but she described it as only "rabbit turds".  Her last bowel movement prior to that was about 2 weeks prior to this admission.  She has had some subjective fevers and chills, but denies any headache, neck pain, chest pain, shortness of breath, hematemesis.  There is no dysuria or hematuria.  She states that she has some abdominal fullness and pressure in the upper abdomen when she eats.  She denies any alcohol or NSAIDs or any new medications.  She quit smoking 2-1/2 years ago after 25-pack-year history. ?In the ED, the patient was afebrile hemodynamically stable with oxygen saturation 96% on 1 L.  WBC 20.7, hemoglobin 15.4, platelets 220,000.  Sodium 130, potassium 3.3, bicarb 25, serum creatinine 0.1.  LFTs were unremarkable with lipase 34.  UA was negative for pyuria.  CT of the abdomen and pelvis showed adrenal gland thickening and enlargement with periadrenal enhancement.  There is also inflammatory changes of the descending and sigmoid colon.  The patient was started on IV fluids and IV Zosyn.  General surgery was consulted to assist with management.  GI was also consulted to assist with management. ?

## 2022-01-03 NOTE — Assessment & Plan Note (Addendum)
Holding amlodipine and benazepril due to soft BPs initially>>restart at d/c ?Monitor clinically ?

## 2022-01-03 NOTE — Assessment & Plan Note (Signed)
Continue PPI ?

## 2022-01-03 NOTE — Assessment & Plan Note (Signed)
Continue statin. 

## 2022-01-03 NOTE — Progress Notes (Signed)
?  ?       ?PROGRESS NOTE ? ?Lynn [name] XBL:[id number] DOB: 28-May-1951 DOA: 01/02/2022 ?PCP: Nicholes Rough, PA-C ? ?Brief History:  ?71 year old female with a history of hypertension, PTSD, hyperlipidemia, depression, GERD, hyperlipidemia presenting with 2-week history of upper abdominal pain that has worsened over the past 2 days.  She has had intermittent nausea and vomiting and dry heaving.  She has not had any diarrhea, hematochezia, melena.  She states that she has felt somewhat constipated.  She had small bowel movement prior to coming to the hospital on 01/02/2022, but she described it as only "rabbit turds".  Her last bowel movement prior to that was about 2 weeks prior to this admission.  She has had some subjective fevers and chills, but denies any headache, neck pain, chest pain, shortness of breath, hematemesis.  There is no dysuria or hematuria.  She states that she has some abdominal fullness and pressure in the upper abdomen when she eats.  She denies any alcohol or NSAIDs or any new medications.  She quit smoking 2-1/2 years ago after 25-pack-year history. ?In the ED, the patient was afebrile hemodynamically stable with oxygen saturation 96% on 1 L.  WBC 20.7, hemoglobin 15.4, platelets 220,000.  Sodium 130, potassium 3.3, bicarb 25, serum creatinine 0.1.  LFTs were unremarkable with lipase 34.  UA was negative for pyuria.  CT of the abdomen and pelvis showed adrenal gland thickening and enlargement with periadrenal enhancement.  There is also inflammatory changes of the descending and sigmoid colon.  The patient was started on IV fluids and IV Zosyn.  General surgery was consulted to assist with management.  GI was also consulted to assist with management.  ? ? ? ?Assessment and Plan: ?* Ischemic colitis ([place]) ?GI consult ?CTA abdomen and pelvis ?Continue empiric Zosyn ?Lactic acid peaked at 3.2>> 2.4 ?Clear liquid diet for now ?Continue IV fluids ? ?Adrenal abnormality ([place]) ?Suspect this is  related to ASVD/ischemia ?-am cortisol ?-No clinical signs of adrenal crisis presently ?-will ultimately need outpt endocrine follow up ? ?Hypokalemia ?Replete ?Check magnesium ? ?Depression ?Continue Paxil ?PDMP reviewed--patient receives alprazolam 1 mg, #60 monthly ?-- Last refill 11/28/2021 ? ?Obesity ?BMI 31.18 ?Lifestyle modification ? ?Mixed hyperlipidemia ?Continue statin ? ?GERD (gastroesophageal reflux disease) ?Continue PPI ? ?Essential hypertension ?Holding amlodipine and benazepril ?Monitor clinically ? ? ? ? ? ? ? ? ? ? ? ? ?Family Communication:  no Family at bedside ? ?Consultants:  GI, general surgery ? ?Code Status:  FULL  ? ?DVT Prophylaxis:  [place] Lovenox ? ? ?Procedures: ?As Listed in Progress Note Above ? ?Antibiotics: ?Zosyn 4/19>> ? ? ?Total time spent 50 minutes.  Greater than 50% spent face to face counseling and coordinating care. ? ? ? ?Subjective: ?Pt states abd pain is a little better.  Patient denies fevers, chills, headache, chest pain, dyspnea, nausea, vomiting, diarrhea,  dysuria, hematuria, hematochezia, and melena. ? ? ?Objective: ?Vitals:  ? 01/03/22 0400 01/03/22 0500 01/03/22 0600 01/03/22 0700  ?BP: 117/71 126/66 122/63 127/64  ?Pulse: 90 89 91 89  ?Resp: '20 18 20 '$ (!) 21  ?Temp:      ?TempSrc:      ?SpO2: 96% 95% 99% 96%  ?Weight:      ?Height:      ? ? ?Intake/Output Summary (Last 24 hours) at 01/03/2022 0826 ?Last data filed at 01/03/2022 3007 ?Gross per 24 hour  ?Intake 3235 ml  ?Output --  ?Net 3235 ml  ? ?Weight change:  ?  Exam: ? ?General:  Pt is alert, follows commands appropriately, not in acute distress ?HEENT: No icterus, No thrush, No neck mass, [place]/AT ?Cardiovascular: RRR, S1/S2, no rubs, no gallops ?Respiratory: diminished BS.  Bibasilar crackles.  No wheeze ?Abdomen: Soft/+BS, epigastric/LUQ tender, non distended, no guarding ?Extremities: No edema, No lymphangitis, No petechiae, No rashes, no synovitis ? ? ?Data Reviewed: ?I have personally reviewed following labs and  imaging studies ?Basic Metabolic Panel: ?Recent Labs  ?Lab 01/02/22 ?1729  ?NA 140  ?K 3.3*  ?CL 105  ?CO2 25  ?GLUCOSE 216*  ?BUN 23  ?CREATININE 0.81  ?CALCIUM 9.8  ? ?Liver Function Tests: ?Recent Labs  ?Lab 01/02/22 ?1729  ?AST 27  ?ALT 15  ?ALKPHOS 86  ?BILITOT 0.9  ?PROT 8.1  ?ALBUMIN 4.4  ? ?Recent Labs  ?Lab 01/02/22 ?1729  ?LIPASE 34  ? ?No results for input(s): AMMONIA in the last 168 hours. ?Coagulation Profile: ?No results for input(s): INR, PROTIME in the last 168 hours. ?CBC: ?Recent Labs  ?Lab 01/02/22 ?1729 01/02/22 ?2216  ?WBC 20.7*  --   ?NEUTROABS  --  20.4*  ?HGB 15.4*  --   ?HCT 46.4*  --   ?MCV 92.2  --   ?PLT 228  --   ? ?Cardiac Enzymes: ?No results for input(s): CKTOTAL, CKMB, CKMBINDEX, TROPONINI in the last 168 hours. ?BNP: ?Invalid input(s): POCBNP ?CBG: ?No results for input(s): GLUCAP in the last 168 hours. ?HbA1C: ?No results for input(s): HGBA1C in the last 72 hours. ?Urine analysis: ?   ?Component Value Date/Time  ? COLORURINE AMBER (A) 01/02/2022 2010  ? APPEARANCEUR CLEAR 01/02/2022 2010  ? LABSPEC 1.021 01/02/2022 2010  ? PHURINE 5.0 01/02/2022 2010  ? [place] NEGATIVE 01/02/2022 2010  ? HGBUR SMALL (A) 01/02/2022 2010  ? [place] NEGATIVE 01/02/2022 2010  ? KETONESUR 5 (A) 01/02/2022 2010  ? PROTEINUR 30 (A) 01/02/2022 2010  ? UROBILINOGEN 0.2 08/14/2013 1403  ? NITRITE NEGATIVE 01/02/2022 2010  ? [place] NEGATIVE 01/02/2022 2010  ? ?Sepsis Labs: ?'@LABRCNTIP'$ (procalcitonin:4,lacticidven:4) ?) ?Recent Results (from the past 240 hour(s))  ?Culture, blood (routine x 2)     Status: None (Preliminary result)  ? Collection Time: 01/02/22 10:16 PM  ? Specimen: BLOOD LEFT FOREARM  ?Result Value Ref Range Status  ? Specimen Description BLOOD LEFT FOREARM  Final  ? Special Requests   Final  ?  BOTTLES DRAWN AEROBIC AND ANAEROBIC Blood Culture adequate volume ?Performed at [facility], [address] [place], [place] [address] ?  ? Culture PENDING  Incomplete  ? Report Status  PENDING  Incomplete  ?Culture, blood (routine x 2)     Status: None (Preliminary result)  ? Collection Time: 01/02/22 10:16 PM  ? Specimen: Right Antecubital; Blood  ?Result Value Ref Range Status  ? Specimen Description RIGHT ANTECUBITAL  Final  ? Special Requests   Final  ?  BOTTLES DRAWN AEROBIC AND ANAEROBIC Blood Culture adequate volume ?Performed at [facility], [address], [place], [place] [address] ?  ? Culture PENDING  Incomplete  ? Report Status PENDING  Incomplete  ?  ? ?Scheduled Meds: ? pantoprazole  40 mg Oral Daily  ? ?Continuous Infusions: ? sodium chloride Stopped (01/02/22 2330)  ? sodium chloride 1,000 mL (01/03/22 0734)  ? piperacillin-tazobactam (ZOSYN)  IV    ? ? ?Procedures/Studies: ?DG Abdomen 1 View ? ?Result Date: 01/02/2022 ?CLINICAL DATA:  Constipation and abdominal pain EXAM: ABDOMEN - 1 VIEW COMPARISON:  None. FINDINGS: The bowel gas pattern is normal.  No radio-opaque calculi or other significant radiographic abnormality are seen. IMPRESSION: Negative. Electronically Signed   By: Ulyses Jarred [name].   On: 01/02/2022 19:54  ? ?CT Abdomen Pelvis W Contrast ? ?Result Date: 01/02/2022 ?CLINICAL DATA:  Abdominal pain, acute, nonlocalized EXAM: CT ABDOMEN AND PELVIS WITH CONTRAST TECHNIQUE: Multidetector CT imaging of the abdomen and pelvis was performed using the standard protocol following bolus administration of intravenous contrast. RADIATION DOSE REDUCTION: This exam was performed according to the departmental dose-optimization program which includes automated exposure control, adjustment of the mA and/or kV according to patient size and/or use of iterative reconstruction technique. CONTRAST:  149m OMNIPAQUE IOHEXOL 300 MG/ML  SOLN COMPARISON:  10/01/2019 FINDINGS: Lower chest: Small atrial septal defect (image # 8/2). Calcification of the aortic valve leaflets noted. Cardiac size is within normal limits. Visualized lung bases are clear. Hepatobiliary: No focal liver abnormality is  seen. No gallstones, gallbladder wall thickening, or biliary dilatation. Pancreas: Unremarkable Spleen: The spleen appears relatively small and multilobulated with prominent clefts which may reflect the sequela of
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
## 2022-01-03 NOTE — Assessment & Plan Note (Addendum)
Suspect this is related to ASVD/ischemia ?-am cortisol--14.7 ?-No clinical signs of adrenal crisis presently ?-will ultimately need outpt endocrine follow up>>referral made to Dr. Dorris Fetch outpt ?

## 2022-01-03 NOTE — TOC Progression Note (Signed)
?  Transition of Care (TOC) Screening Note ? ? ?Patient Details  ?Name: Lynn [name] ?Date of Birth: 11/16/1950 ? ? ?Transition of Care (TOC) CM/SW Contact:    ?Boneta Lucks, RN ?Phone Number: ?01/03/2022, 10:59 AM ? ? ? ?Transition of Care Department [facility]) has reviewed patient and no TOC needs have been identified at this time. We will continue to monitor patient advancement through interdisciplinary progression rounds. If new patient transition needs arise, please place a TOC consult. ? ? ?  ?Barriers to Discharge: Continued Medical Work up ? ?
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
## 2022-01-03 NOTE — Assessment & Plan Note (Signed)
Continue Paxil ?PDMP reviewed--patient receives alprazolam 1 mg, #60 monthly ?-- Last refill 11/28/2021 ?

## 2022-01-03 NOTE — Progress Notes (Signed)
Pharmacy Antibiotic Note ? ?Lynn [name] is a 71 y.o. female admitted on 01/02/2022 with  intra-abdominal infection .  Pharmacy has been consulted for zosyn dosing. ? ?Plan: ?Zosyn 3.375g IV once IV over 30 minutes and then q8h (4 hour infusion). ?F/u cxs and clinical progress ?Monitor V/S, labs ? ?Height: '5\' 1"'$  (154.9 cm) ?Weight: 74.8 kg (165 lb) ?IBW/kg (Calculated) : 47.8 ? ?Temp (24hrs), Avg:98 ?F (36.7 ?C), Min:97.2 ?F (36.2 ?C), Max:98.5 ?F (36.9 ?C) ? ?Recent Labs  ?Lab 01/02/22 ?1729 01/02/22 ?2216 01/03/22 ?0003  ?WBC 20.7*  --   --   ?CREATININE 0.81  --   --   ?LATICACIDVEN  --  3.2* 2.4*  ?  ?Estimated Creatinine Clearance: 61.5 mL/min (by C-G formula based on SCr of 0.81 mg/dL).   ? ?Allergies  ?Allergen Reactions  ? Diclofenac Sodium Anaphylaxis  ? Hydrocodone Itching and Nausea And Vomiting  ? Lipitor [Atorvastatin Calcium] Other (See Comments)  ?  Increased LFT's, myalgias  ? Niacin Other (See Comments)  ?  Flushing  ? Other Other (See Comments)  ?  Allergic to metal states body rejects it  ? Zolpidem Other (See Comments)  ?  Severe headache  ? ? ?Antimicrobials this admission: ?zosyn 4/20 >>  ?Vancomycin  4/19 >> 4/20 ?Cefepime 4/19> 4/20 ? ?Microbiology results: ?4.29 BCx: pending ? ?Thank you for allowing pharmacy to be a part of this patient?s care. ? ?Isac Sarna, BS Pharm D, BCPS ?Clinical Pharmacist ?01/03/2022 7:29 AM ? ?

## 2022-01-03 NOTE — Progress Notes (Signed)
Pharmacy Antibiotic Note ? ?Lynn [name] is a 71 y.o. female admitted on 01/02/2022 with sepsis.  Pharmacy has been consulted for Vancomycin/Cefepime dosing. WBC is elevated. Renal function is ok.  ? ?Plan: ?Vancomycin 750 mg IV q24h ?>>>Estimated AUC: 436 ?Cefepime 2g IV q12h ?Trend WBC, temp, renal function  ?F/U infectious work-up ?Drug levels as indicated ? ? ?Height: '5\' 1"'$  (154.9 cm) ?Weight: 74.8 kg (165 lb) ?IBW/kg (Calculated) : 47.8 ? ?Temp (24hrs), Avg:98 ?F (36.7 ?C), Min:97.2 ?F (36.2 ?C), Max:98.5 ?F (36.9 ?C) ? ?Recent Labs  ?Lab 01/02/22 ?1729 01/02/22 ?2216 01/03/22 ?0003  ?WBC 20.7*  --   --   ?CREATININE 0.81  --   --   ?LATICACIDVEN  --  3.2* 2.4*  ?  ?Estimated Creatinine Clearance: 61.5 mL/min (by C-G formula based on SCr of 0.81 mg/dL).   ? ?Allergies  ?Allergen Reactions  ? Diclofenac Sodium Anaphylaxis  ? Hydrocodone Itching and Nausea And Vomiting  ? Lipitor [Atorvastatin Calcium] Other (See Comments)  ?  Increased LFT's, myalgias  ? Niacin Other (See Comments)  ?  Flushing  ? Other Other (See Comments)  ?  Allergic to metal states body rejects it  ? Zolpidem Other (See Comments)  ?  Severe headache  ? ? ?Narda Bonds, PharmD, BCPS ?Clinical Pharmacist ?Phone: [phone] ? ? ?

## 2022-01-03 NOTE — Assessment & Plan Note (Addendum)
Repleted ?Check magnesium 2.0 ?

## 2022-01-04 DIAGNOSIS — A419 Sepsis, unspecified organism: Secondary | ICD-10-CM

## 2022-01-04 LAB — COMPREHENSIVE METABOLIC PANEL
ALT: 12 U/L (ref 0–44)
AST: 16 U/L (ref 15–41)
Albumin: 2.9 g/dL — ABNORMAL LOW (ref 3.5–5.0)
Alkaline Phosphatase: 51 U/L (ref 38–126)
Anion gap: 5 (ref 5–15)
BUN: 13 mg/dL (ref 8–23)
CO2: 23 mmol/L (ref 22–32)
Calcium: 8.3 mg/dL — ABNORMAL LOW (ref 8.9–10.3)
Chloride: 114 mmol/L — ABNORMAL HIGH (ref 98–111)
Creatinine, Ser: 0.64 mg/dL (ref 0.44–1.00)
GFR, Estimated: >60 mL/min (ref >60)
Glucose, Bld: 86 mg/dL (ref 70–99)
Potassium: 3.6 mmol/L (ref 3.5–5.1)
Sodium: 142 mmol/L (ref 135–145)
Total Bilirubin: 1.2 mg/dL (ref 0.3–1.2)
Total Protein: 6.1 g/dL — ABNORMAL LOW (ref 6.5–8.1)

## 2022-01-04 LAB — CBC WITH DIFFERENTIAL/PLATELET
Abs Immature Granulocytes: 0.05 10*3/uL (ref 0.00–0.07)
Basophils Absolute: 0.1 10*3/uL (ref 0.0–0.1)
Basophils Relative: 1 %
Eosinophils Absolute: 0.1 10*3/uL (ref 0.0–0.5)
Eosinophils Relative: 1 %
HCT: 35.3 % — ABNORMAL LOW (ref 36.0–46.0)
Hemoglobin: 11.2 g/dL — ABNORMAL LOW (ref 12.0–15.0)
Immature Granulocytes: 0 %
Lymphocytes Relative: 20 %
Lymphs Abs: 2.6 10*3/uL (ref 0.7–4.0)
MCH: 29.6 pg (ref 26.0–34.0)
MCHC: 31.7 g/dL (ref 30.0–36.0)
MCV: 93.4 fL (ref 80.0–100.0)
Monocytes Absolute: 1.1 10*3/uL — ABNORMAL HIGH (ref 0.1–1.0)
Monocytes Relative: 8 %
Neutro Abs: 9.6 10*3/uL — ABNORMAL HIGH (ref 1.7–7.7)
Neutrophils Relative %: 70 %
Platelets: 163 10*3/uL (ref 150–400)
RBC: 3.78 MIL/uL — ABNORMAL LOW (ref 3.87–5.11)
RDW: 13.2 % (ref 11.5–15.5)
WBC: 13.5 10*3/uL — ABNORMAL HIGH (ref 4.0–10.5)
nRBC: 0 % (ref 0.0–0.2)

## 2022-01-04 LAB — MAGNESIUM: Magnesium: 2 mg/dL (ref 1.7–2.4)

## 2022-01-04 LAB — APTT: aPTT: 32 seconds (ref 24–36)

## 2022-01-04 LAB — CORTISOL-AM, BLOOD: Cortisol - AM: 14.7 ug/dL (ref 6.7–22.6)

## 2022-01-04 LAB — LACTIC ACID, PLASMA: Lactic Acid, Venous: 0.6 mmol/L (ref 0.5–1.9)

## 2022-01-04 MED ORDER — ACETAMINOPHEN 325 MG PO TABS: 650.0000 mg | ORAL_TABLET | Freq: Four times a day (QID) | ORAL | Status: DC | PRN

## 2022-01-04 NOTE — Progress Notes (Signed)
? ?Gastroenterology Progress Note  ? ?Referring Provider: No ref. provider found ?Primary Care Physician:  Nicholes Rough, PA-C ?Primary Gastroenterologist:  Dr. Carlean Purl ([facility]) ? ?Patient ID: Lynn [name]; [id number]; [birth year]  ? ? ?Subjective  ? ?Patient reports she has been doing well this morning.  She denies any abdominal pain, nausea, vomiting, melena, or hematochezia.  She reports a good bowel movement yesterday that was more mushy and soft and watery.  She felt like she may need to have a bowel movement this morning and states she tried to strain and was unable to go.  She felt like maybe her feeling of needing to defecate was just gas.  She denied having much on her breakfast tray this morning, is going to try lunch and see how she does.  She does endorse wanting to eat more regular food. ? ?Objective  ? ?Vital signs in last 24 hours ?Temp:  [97.9 ?F (36.6 ?C)-99.7 ?F (37.6 ?C)] 97.9 ?F (36.6 ?C) (04/21 0534) ?Pulse Rate:  [78-91] 83 (04/21 0534) ?Resp:  [16-20] 17 (04/21 0534) ?BP: (118-152)/(65-98) 152/76 (04/21 0534) ?SpO2:  [95 %-97 %] 95 % (04/21 0534) ?Weight:  [75.3 kg] 75.3 kg (04/20 1447) ?Last BM Date : 01/03/22 ? ?Physical Exam ?General:   Alert and oriented, pleasant ?Head:  Normocephalic and atraumatic. ?Eyes:  No icterus, sclera clear. Conjuctiva pink.  ?Heart:  S1, S2 present, no murmurs noted.  ?Lungs: Clear to auscultation bilaterally, without wheezing, rales, or rhonchi.  ?Abdomen:  Bowel sounds present, soft, mild TTP to epigastrium and periumbilical region with some mid left-sided tenderness as well, non-distended. No HSM or hernias noted. No rebound or guarding. No masses appreciated  ?Msk:  Symmetrical without gross deformities. Normal posture. ?Extremities:  Without clubbing or edema. ?Neurologic:  Alert and  oriented x4;  grossly normal neurologically. ?Skin:  Warm and dry, intact without significant lesions.  ?Psych:  Alert and cooperative. Normal mood and affect. ? ?Intake/Output  from previous day: ?04/20 0701 - 04/21 0700 ?In: 2574.1 [I.V.:2439.1; IV Piggyback:135] ?Out: 1300 [Urine:1300] ?Intake/Output this shift: ?No intake/output data recorded. ? ?Lab Results ? ?Recent Labs  ?  01/02/22 ?1729 01/03/22 ?0743 01/04/22 ?0542  ?WBC 20.7* 21.7* 13.5*  ?HGB 15.4* 12.1 11.2*  ?HCT 46.4* 37.3 35.3*  ?PLT 228 181 163  ? ?BMET ?Recent Labs  ?  01/02/22 ?1729 01/03/22 ?0743 01/04/22 ?0542  ?NA 140  --  142  ?K 3.3*  --  3.6  ?CL 105  --  114*  ?CO2 25  --  23  ?GLUCOSE 216*  --  86  ?BUN 23  --  13  ?CREATININE 0.81 0.81 0.64  ?CALCIUM 9.8  --  8.3*  ? ?LFT ?Recent Labs  ?  01/02/22 ?1729 01/04/22 ?0542  ?PROT 8.1 6.1*  ?ALBUMIN 4.4 2.9*  ?AST 27 16  ?ALT 15 12  ?ALKPHOS 86 51  ?BILITOT 0.9 1.2  ? ?PT/INR ?No results for input(s): LABPROT, INR in the last 72 hours. ?Hepatitis Panel ?No results for input(s): HEPBSAG, HCVAB, HEPAIGM, HEPBIGM in the last 72 hours. ? ? ?Studies/Results ?DG Abdomen 1 View ? ?Result Date: 01/02/2022 ?CLINICAL DATA:  Constipation and abdominal pain EXAM: ABDOMEN - 1 VIEW COMPARISON:  None. FINDINGS: The bowel gas pattern is normal. No radio-opaque calculi or other significant radiographic abnormality are seen. IMPRESSION: Negative. Electronically Signed   By: Ulyses Jarred [name].   On: 01/02/2022 19:54  ? ?CT Abdomen Pelvis W Contrast ? ?Result Date: 01/02/2022 ?CLINICAL DATA:  Abdominal pain, acute, nonlocalized EXAM: CT ABDOMEN AND PELVIS WITH CONTRAST TECHNIQUE: Multidetector CT imaging of the abdomen and pelvis was performed using the standard protocol following bolus administration of intravenous contrast. RADIATION DOSE REDUCTION: This exam was performed according to the departmental dose-optimization program which includes automated exposure control, adjustment of the mA and/or kV according to patient size and/or use of iterative reconstruction technique. CONTRAST:  128m OMNIPAQUE IOHEXOL 300 MG/ML  SOLN COMPARISON:  10/01/2019 FINDINGS: Lower chest: Small atrial  septal defect (image # 8/2). Calcification of the aortic valve leaflets noted. Cardiac size is within normal limits. Visualized lung bases are clear. Hepatobiliary: No focal liver abnormality is seen. No gallstones, gallbladder wall thickening, or biliary dilatation. Pancreas: Unremarkable Spleen: The spleen appears relatively small and multilobulated with prominent clefts which may reflect the sequela of remote infarction or trauma. Normal enhancement of the residual spleen. Adrenals/Urinary Tract: The adrenal glands are slightly thickened and enlarged when compared to prior examination. The glands demonstrate preserved enhancement with slight washout on delayed images. There is extensive periadrenal inflammatory stranding. Altogether, these findings may reflect changes of adrenal congestion, a finding that may proceed nontraumatic adrenal hemorrhage. The kidneys are normal.  The bladder is unremarkable. Stomach/Bowel: There is mild pericolonic inflammatory stranding and subtle circumferential bowel wall thickening involving the descending and proximal sigmoid colon which may reflect changes of acute infectious, inflammatory, or ischemic colitis. The stomach, small bowel, and large bowel are otherwise unremarkable. No obstruction. Appendix normal. No free intraperitoneal gas or fluid. Vascular/Lymphatic: Moderate aortoiliac atherosclerotic calcification. No aortic aneurysm. The renal veins, suprarenal inferior vena cava, and left adrenal vein are all patent. No pathologic adenopathy within the abdomen and pelvis. Reproductive: Uterus and bilateral adnexa are unremarkable. Other: No abdominal wall hernia. Musculoskeletal: Left hip ORIF has been performed. No acute bone abnormality. L2-L5 fusion with instrumentation has been performed. IMPRESSION: Thickening and hyperemia of the adrenal glands with preserved perfusion with extensive periadrenal inflammatory change. This may reflect changes of adrenal congestion and  may proceed nontraumatic adrenal hemorrhage. Correlation with laboratory examination may be helpful for further evaluation. Inflammatory changes involving the descending and sigmoid colon in keeping with changes of infectious, inflammatory, or ischemic colitis. Of note, the superior and inferior mesenteric arteries and veins are patent proximally. Small atrial septal defect. Calcification of the aortic valve leaflets. Echocardiography may be helpful to assess the degree of valvular pathology as well as confirm the presence of a septal defect. Aortic Atherosclerosis (ICD10-I70.0). Electronically Signed   By: AFidela SalisburyM.D.   On: 01/02/2022 22:34   ? ?Assessment  ?71y.o. female with a history of chronic constipation, anxiety, hypertension, GERD, hyperlipidemia, obesity, PTSD, depression, peripheral vascular disease, chronic back pain, s/p splenectomy (age 89, who presented to the ED with abdominal pain for 2 days.  GI consulted for suspected ischemic colitis. ? ?Abdominal pain: Pain began 2 days ago.  She had intermittent nausea with dry heaves.  She denied any fevers.  She initially thought her symptoms were due to : , which is chronic for her.  She had taken 3 bisacodyl laxatives without results and due to worsening pain she presented to the ED.  Last colonoscopy in 2021 with polyps in the descending, transverse, ascending colon, and in the cecum, diverticulosis in the sigmoid colon (path showed tubular adenomas). CT A/P with contrast shows inflammatory changes involving the descending and sigmoid colon which could be secondary to an infectious, inflammatory, or ischemic etiology.  SMA and IMA arteries appear  patent.  She has continued to have nonbloody stool.  She had a bowel movement yesterday that was normal she but not watery.  She reported feeling like she needed to use the bathroom this morning and tried to go, but felt like it was just gas.  She does not present with typical infectious or inflammatory  colitis, however it is also atypical for ischemic colitis without bloody stools.  She presented with profound leukocytosis with WBC 20.7, with peak at 21.7.  WBC improved to 13.5.  Hemoglobin declined to

## 2022-01-04 NOTE — Progress Notes (Signed)
?  ?       ?PROGRESS NOTE ? ?AUSTRALIA DROLL YHC:[id number] DOB: 08-19-1951 DOA: 01/02/2022 ?PCP: Nicholes Rough, PA-C ? ?Brief History:  ?71 year old female with a history of hypertension, PTSD, hyperlipidemia, depression, GERD, hyperlipidemia presenting with 2-week history of upper abdominal pain that has worsened over the past 2 days.  She has had intermittent nausea and vomiting and dry heaving.  She has not had any diarrhea, hematochezia, melena.  She states that she has felt somewhat constipated.  She had small bowel movement prior to coming to the hospital on 01/02/2022, but she described it as only "rabbit turds".  Her last bowel movement prior to that was about 2 weeks prior to this admission.  She has had some subjective fevers and chills, but denies any headache, neck pain, chest pain, shortness of breath, hematemesis.  There is no dysuria or hematuria.  She states that she has some abdominal fullness and pressure in the upper abdomen when she eats.  She denies any alcohol or NSAIDs or any new medications.  She quit smoking 2-1/2 years ago after 25-pack-year history. ?In the ED, the patient was afebrile hemodynamically stable with oxygen saturation 96% on 1 L.  WBC 20.7, hemoglobin 15.4, platelets 220,000.  Sodium 130, potassium 3.3, bicarb 25, serum creatinine 0.1.  LFTs were unremarkable with lipase 34.  UA was negative for pyuria.  CT of the abdomen and pelvis showed adrenal gland thickening and enlargement with periadrenal enhancement.  There is also inflammatory changes of the descending and sigmoid colon.  The patient was started on IV fluids and IV Zosyn.  General surgery was consulted to assist with management.  GI was also consulted to assist with management.  ? ? ? ?Assessment and Plan: ?* Ischemic colitis ([place]) ?GI consult appreciated>>continue current medical therapy ?Continue empiric Zosyn>>clinically improving ?Lactic acid peaked at 3.2>> 2.4>>0.6 ?Clear liquid diet>>full liquids ?Continue IV  fluids ? ?Adrenal abnormality ([place]) ?Suspect this is related to ASVD/ischemia ?-am cortisol--14.7 ?-No clinical signs of adrenal crisis presently ?-will ultimately need outpt endocrine follow up ? ?Hypokalemia ?Replete ?Check magnesium 2.0 ? ?Depression ?Continue Paxil ?PDMP reviewed--patient receives alprazolam 1 mg, #60 monthly ?-- Last refill 11/28/2021 ? ?Obesity ?BMI 31.18 ?Lifestyle modification ? ?Mixed hyperlipidemia ?Continue statin ? ?GERD (gastroesophageal reflux disease) ?Continue PPI ? ?Essential hypertension ?Holding amlodipine and benazepril ?Monitor clinically ? ?Family Communication:  no Family at bedside ?  ?Consultants:  GI, general surgery ?  ?Code Status:  FULL  ?  ?DVT Prophylaxis:  [place] Lovenox ?  ?  ?Procedures: ?As Listed in Progress Note Above ?  ?Antibiotics: ?Zosyn 4/19>> ? ? ? ? ? ? ? ? ?Subjective: ?Patient states abd pain is improving.  Denies cp, sob, n/v/d.   ? ?Objective: ?Vitals:  ? 01/03/22 2340 01/04/22 0318 01/04/22 0534 01/04/22 1351  ?BP: 118/66 120/73 (!) 152/76 (!) 169/90  ?Pulse: 85 78 83 (!) 101  ?Resp: '16 17 17 20  '$ ?Temp: 98.6 ?F (37 ?C) 98.5 ?F (36.9 ?C) 97.9 ?F (36.6 ?C) 98.5 ?F (36.9 ?C)  ?TempSrc: Oral Oral  Oral  ?SpO2: 95% 96% 95% 98%  ?Weight:      ?Height:      ? ? ?Intake/Output Summary (Last 24 hours) at 01/04/2022 1817 ?Last data filed at 01/04/2022 1700 ?Gross per 24 hour  ?Intake 1059.76 ml  ?Output 1300 ml  ?Net -240.24 ml  ? ?Weight change: 0.456 kg ?Exam: ? ?General:  Pt is alert, follows commands appropriately, not in acute distress ?HEENT:  No icterus, No thrush, No neck mass, [place]/AT ?Cardiovascular: RRR, S1/S2, no rubs, no gallops ?Respiratory: CTA bilaterally, no wheezing, no crackles, no rhonchi ?Abdomen: Soft/+BS, LLQ tender, non distended, no guarding ?Extremities: No edema, No lymphangitis, No petechiae, No rashes, no synovitis ? ? ?Data Reviewed: ?I have personally reviewed following labs and imaging studies ?Basic Metabolic Panel: ?Recent Labs  ?Lab  01/02/22 ?1729 01/03/22 ?0743 01/04/22 ?0542  ?NA 140  --  142  ?K 3.3*  --  3.6  ?CL 105  --  114*  ?CO2 25  --  23  ?GLUCOSE 216*  --  86  ?BUN 23  --  13  ?CREATININE 0.81 0.81 0.64  ?CALCIUM 9.8  --  8.3*  ?MG  --  2.0 2.0  ?PHOS  --  1.7*  --   ? ?Liver Function Tests: ?Recent Labs  ?Lab 01/02/22 ?1729 01/04/22 ?0542  ?AST 27 16  ?ALT 15 12  ?ALKPHOS 86 51  ?BILITOT 0.9 1.2  ?PROT 8.1 6.1*  ?ALBUMIN 4.4 2.9*  ? ?Recent Labs  ?Lab 01/02/22 ?1729  ?LIPASE 34  ? ?No results for input(s): AMMONIA in the last 168 hours. ?Coagulation Profile: ?No results for input(s): INR, PROTIME in the last 168 hours. ?CBC: ?Recent Labs  ?Lab 01/02/22 ?1729 01/02/22 ?2216 01/03/22 ?5638 01/04/22 ?0542  ?WBC 20.7*  --  21.7* 13.5*  ?NEUTROABS  --  20.4*  --  9.6*  ?HGB 15.4*  --  12.1 11.2*  ?HCT 46.4*  --  37.3 35.3*  ?MCV 92.2  --  92.3 93.4  ?PLT 228  --  181 163  ? ?Cardiac Enzymes: ?No results for input(s): CKTOTAL, CKMB, CKMBINDEX, TROPONINI in the last 168 hours. ?BNP: ?Invalid input(s): POCBNP ?CBG: ?No results for input(s): GLUCAP in the last 168 hours. ?HbA1C: ?No results for input(s): HGBA1C in the last 72 hours. ?Urine analysis: ?   ?Component Value Date/Time  ? COLORURINE AMBER (A) 01/02/2022 2010  ? APPEARANCEUR CLEAR 01/02/2022 2010  ? LABSPEC 1.021 01/02/2022 2010  ? PHURINE 5.0 01/02/2022 2010  ? [place] NEGATIVE 01/02/2022 2010  ? HGBUR SMALL (A) 01/02/2022 2010  ? [place] NEGATIVE 01/02/2022 2010  ? KETONESUR 5 (A) 01/02/2022 2010  ? PROTEINUR 30 (A) 01/02/2022 2010  ? UROBILINOGEN 0.2 08/14/2013 1403  ? NITRITE NEGATIVE 01/02/2022 2010  ? [place] NEGATIVE 01/02/2022 2010  ? ?Sepsis Labs: ?'@LABRCNTIP'$ (procalcitonin:4,lacticidven:4) ?) ?Recent Results (from the past 240 hour(s))  ?Culture, blood (routine x 2)     Status: None (Preliminary result)  ? Collection Time: 01/02/22 10:16 PM  ? Specimen: BLOOD LEFT FOREARM  ?Result Value Ref Range Status  ? Specimen Description BLOOD LEFT FOREARM  Final  ? Special  Requests   Final  ?  BOTTLES DRAWN AEROBIC AND ANAEROBIC Blood Culture adequate volume  ? Culture   Final  ?  NO GROWTH 2 DAYS ?Performed at [facility], [address] [place], [place] [address] ?  ? Report Status PENDING  Incomplete  ?Culture, blood (routine x 2)     Status: None (Preliminary result)  ? Collection Time: 01/02/22 10:16 PM  ? Specimen: Right Antecubital; Blood  ?Result Value Ref Range Status  ? Specimen Description RIGHT ANTECUBITAL  Final  ? Special Requests   Final  ?  BOTTLES DRAWN AEROBIC AND ANAEROBIC Blood Culture adequate volume  ? Culture   Final  ?  NO GROWTH 2 DAYS ?Performed at [facility], [address], [place],  [address] ?  ? Report Status PENDING  Incomplete  ?  ? ?Scheduled Meds: ? ALPRAZolam  1 mg Oral BID  ? amLODipine  10 mg Oral Daily  ? benazepril  10 mg Oral q morning  ? enoxaparin (LOVENOX) injection  40 mg Subcutaneous Q24H  ? pantoprazole  40 mg Oral Daily  ? PARoxetine  40 mg Oral Daily  ? pravastatin  80 mg Oral Daily  ? ?Continuous Infusions: ? sodium chloride 1,000 mL (01/04/22 1517)  ? piperacillin-tazobactam (ZOSYN)  IV 3.375 g (01/04/22 1753)  ? ? ?Procedures/Studies: ?DG Abdomen 1 View ? ?Result Date: 01/02/2022 ?CLINICAL DATA:  Constipation and abdominal pain EXAM: ABDOMEN - 1 VIEW COMPARISON:  None. FINDINGS: The bowel gas pattern is normal. No radio-opaque calculi or other significant radiographic abnormality are seen. IMPRESSION: Negative. Electronically Signed   By: Ulyses Jarred [name].   On: 01/02/2022 19:54  ? ?CT Abdomen Pelvis W Contrast ? ?Result Date: 01/02/2022 ?CLINICAL DATA:  Abdominal pain, acute, nonlocalized EXAM: CT ABDOMEN AND PELVIS WITH CONTRAST TECHNIQUE: Multidetector CT imaging of the abdomen and pelvis was performed using the standard protocol following bolus administration of intravenous contrast. RADIATION DOSE REDUCTION: This exam was performed according to the departmental dose-optimization program which includes automated exposure  control, adjustment of the mA and/or kV according to patient size and/or use of iterative reconstruction technique. CONTRAST:  159m OMNIPAQUE IOHEXOL 300 MG/ML  SOLN COMPARISON:  10/01/2019 FINDINGS: LCorinna Capra

## 2022-01-04 NOTE — Progress Notes (Signed)
[facility] Progress Note ? ?   ?Subjective: ?Patient seen and examined.  She is up brushing her teeth.  She states that she feels significantly better than she did yesterday.  She has improvement of her abdominal pain.  She denies nausea and vomiting.  She is only drink some water.  She denies fevers and chills.  She has been moving her bowels as well, and denies any blood in her bowel movements. ? ?Objective: ?Vital signs in last 24 hours: ?Temp:  [97.9 ?F (36.6 ?C)-99.7 ?F (37.6 ?C)] 97.9 ?F (36.6 ?C) (04/21 0534) ?Pulse Rate:  [78-91] 83 (04/21 0534) ?Resp:  [16-20] 17 (04/21 0534) ?BP: (118-152)/(65-98) 152/76 (04/21 0534) ?SpO2:  [95 %-97 %] 95 % (04/21 0534) ?Weight:  [75.3 kg] 75.3 kg (04/20 1447) ?Last BM Date : 01/03/22 ? ?Intake/Output from previous day: ?04/20 0701 - 04/21 0700 ?In: 2574.1 [I.V.:2439.1; IV Piggyback:135] ?Out: 1300 [Urine:1300] ?Intake/Output this shift: ?No intake/output data recorded. ? ?General appearance: alert, cooperative, and no distress ?GI: Abdomen soft, nondistended, no percussion tenderness, nontender to palpation, no rigidity, guarding, rebound tenderness; upper abdominal midline cicatrix noted ? ?Lab Results:  ?Recent Labs  ?  01/03/22 ?1655 01/04/22 ?0542  ?WBC 21.7* 13.5*  ?HGB 12.1 11.2*  ?HCT 37.3 35.3*  ?PLT 181 163  ? ?BMET ?Recent Labs  ?  01/02/22 ?1729 01/03/22 ?0743 01/04/22 ?0542  ?NA 140  --  142  ?K 3.3*  --  3.6  ?CL 105  --  114*  ?CO2 25  --  23  ?GLUCOSE 216*  --  86  ?BUN 23  --  13  ?CREATININE 0.81 0.81 0.64  ?CALCIUM 9.8  --  8.3*  ? ?PT/INR ?No results for input(s): LABPROT, INR in the last 72 hours. ? ?Studies/Results: ?DG Abdomen 1 View ? ?Result Date: 01/02/2022 ?CLINICAL DATA:  Constipation and abdominal pain EXAM: ABDOMEN - 1 VIEW COMPARISON:  None. FINDINGS: The bowel gas pattern is normal. No radio-opaque calculi or other significant radiographic abnormality are seen. IMPRESSION: Negative. Electronically Signed   By: Ulyses Jarred [name].   On: 01/02/2022 19:54  ? ?CT Abdomen Pelvis W Contrast ? ?Result Date: 01/02/2022 ?CLINICAL DATA:  Abdominal pain, acute, nonlocalized EXAM: CT ABDOMEN AND PELVIS WITH CONTRAST TECHNIQUE: Multidetector CT imaging of the abdomen and pelvis was performed using the standard protocol following bolus administration of intravenous contrast. RADIATION DOSE REDUCTION: This exam was performed according to the departmental dose-optimization program which includes automated exposure control, adjustment of the mA and/or kV according to patient size and/or use of iterative reconstruction technique. CONTRAST:  160m OMNIPAQUE IOHEXOL 300 MG/ML  SOLN COMPARISON:  10/01/2019 FINDINGS: Lower chest: Small atrial septal defect (image # 8/2). Calcification of the aortic valve leaflets noted. Cardiac size is within normal limits. Visualized lung bases are clear. Hepatobiliary: No focal liver abnormality is seen. No gallstones, gallbladder wall thickening, or biliary dilatation. Pancreas: Unremarkable Spleen: The spleen appears relatively small and multilobulated with prominent clefts which may reflect the sequela of remote infarction or trauma. Normal enhancement of the residual spleen. Adrenals/Urinary Tract: The adrenal glands are slightly thickened and enlarged when compared to prior examination. The glands demonstrate preserved enhancement with slight washout on delayed images. There is extensive periadrenal inflammatory stranding. Altogether, these findings may reflect changes of adrenal congestion, a finding that may proceed nontraumatic adrenal hemorrhage. The kidneys are normal.  The bladder is unremarkable. Stomach/Bowel: There is mild pericolonic inflammatory stranding and subtle circumferential bowel wall thickening involving the  descending and proximal sigmoid colon which may reflect changes of acute infectious, inflammatory, or ischemic colitis. The stomach, small bowel, and large bowel are otherwise  unremarkable. No obstruction. Appendix normal. No free intraperitoneal gas or fluid. Vascular/Lymphatic: Moderate aortoiliac atherosclerotic calcification. No aortic aneurysm. The renal veins, suprarenal inferior vena cava, and left adrenal vein are all patent. No pathologic adenopathy within the abdomen and pelvis. Reproductive: Uterus and bilateral adnexa are unremarkable. Other: No abdominal wall hernia. Musculoskeletal: Left hip ORIF has been performed. No acute bone abnormality. L2-L5 fusion with instrumentation has been performed. IMPRESSION: Thickening and hyperemia of the adrenal glands with preserved perfusion with extensive periadrenal inflammatory change. This may reflect changes of adrenal congestion and may proceed nontraumatic adrenal hemorrhage. Correlation with laboratory examination may be helpful for further evaluation. Inflammatory changes involving the descending and sigmoid colon in keeping with changes of infectious, inflammatory, or ischemic colitis. Of note, the superior and inferior mesenteric arteries and veins are patent proximally. Small atrial septal defect. Calcification of the aortic valve leaflets. Echocardiography may be helpful to assess the degree of valvular pathology as well as confirm the presence of a septal defect. Aortic Atherosclerosis (ICD10-I70.0). Electronically Signed   By: Fidela Salisbury [name].   On: 01/02/2022 22:34   ? ?Anti-infectives: ?Anti-infectives (From admission, onward)  ? ? Start     Dose/Rate Route Frequency Ordered Stop  ? 01/03/22 2200  vancomycin (VANCOREADY) IVPB 750 mg/150 mL  Status:  Discontinued       ? 750 mg ?150 mL/hr over 60 Minutes Intravenous Every 24 hours 01/03/22 0640 01/03/22 0722  ? 01/03/22 1400  piperacillin-tazobactam (ZOSYN) IVPB 3.375 g       ? 3.375 g ?12.5 mL/hr over 240 Minutes Intravenous Every 8 hours 01/03/22 0725    ? 01/03/22 1000  ceFEPIme (MAXIPIME) 2 g in sodium chloride 0.9 % 100 mL IVPB  Status:  Discontinued       ? 2  g ?200 mL/hr over 30 Minutes Intravenous Every 12 hours 01/03/22 0640 01/03/22 0722  ? 01/03/22 0730  piperacillin-tazobactam (ZOSYN) IVPB 3.375 g       ? 3.375 g ?100 mL/hr over 30 Minutes Intravenous  Once 01/03/22 0725 01/03/22 0752  ? 01/02/22 2330  ceFEPIme (MAXIPIME) 2 g in sodium chloride 0.9 % 100 mL IVPB       ? 2 g ?200 mL/hr over 30 Minutes Intravenous  Once 01/02/22 2320 01/03/22 0018  ? 01/02/22 2330  metroNIDAZOLE (FLAGYL) IVPB 500 mg       ? 500 mg ?100 mL/hr over 60 Minutes Intravenous  Once 01/02/22 2320 01/03/22 0135  ? 01/02/22 2330  vancomycin (VANCOCIN) IVPB 1000 mg/200 mL premix       ? 1,000 mg ?200 mL/hr over 60 Minutes Intravenous  Once 01/02/22 2320 01/03/22 0415  ? ?  ? ? ?Assessment/Plan: ? ?Patient is a 71 year old female who was admitted with ascending and sigmoid colon colitis.  CT abdomen pelvis demonstrating inflammatory changes of the descending and sigmoid colon with patent superior mesenteric and inferior mesenteric vessels. ? ?-Patient's leukocytosis has improved to 13.5 from 21.7 ?-Patient's lactic acid has remained normal at 0.6 ?-Patient with benign abdominal exam and continued nonbloody bowel movements ?-Recommend supportive interventions -IV fluids, pain control ?-May advance patient's diet as she tolerates ?-No acute surgical intervention ?-Remainder of care per primary team ?-Surgical team will follow peripherally, please call with any questions or concerns ? ? LOS: 1 day  ? ? ?Aspen Lawrance A Samyria Rudie ?01/04/2022 ? ?

## 2022-01-04 NOTE — Care Management Important Message (Signed)
Important Message ? ?Patient Details  ?Name: Lynn [name] ?MRN: [id number] ?Date of Birth: 05-15-1951 ? ? ?Medicare Important Message Given:  Yes ? ? ? ? ?Tommy Medal ?01/04/2022, 12:09 PM ?

## 2022-01-05 DIAGNOSIS — K559 Vascular disorder of intestine, unspecified: Secondary | ICD-10-CM

## 2022-01-05 LAB — CBC
HCT: 33.9 % — ABNORMAL LOW (ref 36.0–46.0)
Hemoglobin: 10.9 g/dL — ABNORMAL LOW (ref 12.0–15.0)
MCH: 29.9 pg (ref 26.0–34.0)
MCHC: 32.2 g/dL (ref 30.0–36.0)
MCV: 92.9 fL (ref 80.0–100.0)
Platelets: 168 10*3/uL (ref 150–400)
RBC: 3.65 MIL/uL — ABNORMAL LOW (ref 3.87–5.11)
RDW: 13.1 % (ref 11.5–15.5)
WBC: 8.1 10*3/uL (ref 4.0–10.5)
nRBC: 0 % (ref 0.0–0.2)

## 2022-01-05 LAB — BASIC METABOLIC PANEL
Anion gap: 3 — ABNORMAL LOW (ref 5–15)
BUN: 10 mg/dL (ref 8–23)
CO2: 26 mmol/L (ref 22–32)
Calcium: 7.9 mg/dL — ABNORMAL LOW (ref 8.9–10.3)
Chloride: 115 mmol/L — ABNORMAL HIGH (ref 98–111)
Creatinine, Ser: 0.62 mg/dL (ref 0.44–1.00)
GFR, Estimated: >60 mL/min (ref >60)
Glucose, Bld: 90 mg/dL (ref 70–99)
Potassium: 3.2 mmol/L — ABNORMAL LOW (ref 3.5–5.1)
Sodium: 144 mmol/L (ref 135–145)

## 2022-01-05 LAB — MAGNESIUM: Magnesium: 1.7 mg/dL (ref 1.7–2.4)

## 2022-01-05 MED ORDER — AMOXICILLIN-POT CLAVULANATE 875-125 MG PO TABS: 1.0000 | ORAL_TABLET | Freq: Two times a day (BID) | ORAL | 0 refills | Status: DC

## 2022-01-05 MED ORDER — PIPERACILLIN-TAZOBACTAM 3.375 G IVPB
3.3750 g | Freq: Three times a day (TID) | INTRAVENOUS | Status: DC
  Administered 2022-01-05: 3.375 g via INTRAVENOUS
  Filled 2022-01-05: qty 50

## 2022-01-05 MED ORDER — AMOXICILLIN-POT CLAVULANATE 875-125 MG PO TABS: 1.0000 | ORAL_TABLET | Freq: Two times a day (BID) | ORAL | Status: DC

## 2022-01-05 MED ORDER — POTASSIUM CHLORIDE CRYS ER 20 MEQ PO TBCR
20.0000 meq | EXTENDED_RELEASE_TABLET | Freq: Once | ORAL | Status: AC
  Administered 2022-01-05: 20 meq via ORAL
  Filled 2022-01-05: qty 1

## 2022-01-05 NOTE — Progress Notes (Signed)
?Subjective: ? ?Patient reports feeling much better since she was last seen yesterday afternoon.  She says she is not having any sharp pain.  She now has mild discomfort in left upper quadrant which she describes to be soreness.  She is passing a lot of flatus but has not had a bowel movement in 24 hours.  She is hungry.   ? ?Current Medications: ? ?Current Facility-Administered Medications:  ?  [COMPLETED] sodium chloride 0.9 % bolus 1,000 mL, 1,000 mL, Intravenous, Once, Stopped at 01/03/22 0141 **FOLLOWED BY** [COMPLETED] sodium chloride 0.9 % bolus 1,000 mL, 1,000 mL, Intravenous, Once, Stopped at 01/03/22 0141 **FOLLOWED BY** [COMPLETED] sodium chloride 0.9 % bolus 1,000 mL, 1,000 mL, Intravenous, Once, Stopped at 01/03/22 0141 **FOLLOWED BY** 0.9 %  sodium chloride infusion, 1,000 mL, Intravenous, Continuous, Adefeso, Oladapo, DO, Last Rate: 125 mL/hr at 01/04/22 2355, 1,000 mL at 01/04/22 2355 ?  acetaminophen (TYLENOL) tablet 650 mg, 650 mg, Oral, Q6H PRN, Tat, David, MD ?  ALPRAZolam Duanne Moron) tablet 1 mg, 1 mg, Oral, BID, Adefeso, Oladapo, DO, 1 mg at 01/04/22 2139 ?  amLODipine (NORVASC) tablet 10 mg, 10 mg, Oral, Daily, Adefeso, Oladapo, DO, 10 mg at 01/04/22 0935 ?  benazepril (LOTENSIN) tablet 10 mg, 10 mg, Oral, q morning, Adefeso, Oladapo, DO, 10 mg at 01/04/22 0942 ?  enoxaparin (LOVENOX) injection 40 mg, 40 mg, Subcutaneous, Q24H, Adefeso, Oladapo, DO, 40 mg at 01/04/22 1753 ?  HYDROmorphone (DILAUDID) injection 0.5 mg, 0.5 mg, Intravenous, Q4H PRN, Adefeso, Oladapo, DO, 0.5 mg at 01/03/22 1348 ?  ondansetron (ZOFRAN) injection 4 mg, 4 mg, Intravenous, Q6H PRN, Adefeso, Oladapo, DO ?  pantoprazole (PROTONIX) EC tablet 40 mg, 40 mg, Oral, Daily, Adefeso, Oladapo, DO, 40 mg at 01/04/22 0935 ?  PARoxetine (PAXIL) tablet 40 mg, 40 mg, Oral, Daily, Adefeso, Oladapo, DO, 40 mg at 01/04/22 1330 ?  piperacillin-tazobactam (ZOSYN) IVPB 3.375 g, 3.375 g, Intravenous, Q8H, Tat, David, MD ?  pravastatin  (PRAVACHOL) tablet 80 mg, 80 mg, Oral, Daily, Adefeso, Oladapo, DO, 80 mg at 01/04/22 0935 ? ? ?Objective: ?Blood pressure 124/66, pulse 67, temperature 98.2 ?F (36.8 ?C), resp. rate 18, height '5\' 1"'  (1.549 m), weight 75.3 kg, SpO2 95 %. ?Patient is alert and in no acute distress. ?Abdomen is symmetrical.  Bowel sounds are normal.  On palpation abdomen is soft.  She has mild tenderness at LUQ and LLQ.  No organomegaly or masses. ? ? ?Labs/studies Results: ? ? ? ?  Latest Ref Rng & Units 01/04/2022  ?  5:42 AM 01/03/2022  ?  7:43 AM 01/02/2022  ?  5:29 PM  ?CBC  ?WBC 4.0 - 10.5 K/uL 13.5   21.7   20.7    ?Hemoglobin 12.0 - 15.0 g/dL 11.2   12.1   15.4    ?Hematocrit 36.0 - 46.0 % 35.3   37.3   46.4    ?Platelets 150 - 400 K/uL 163   181   228    ?  ? ?  Latest Ref Rng & Units 01/05/2022  ?  6:24 AM 01/04/2022  ?  5:42 AM 01/03/2022  ?  7:43 AM  ?CMP  ?Glucose 70 - 99 mg/dL 90   86     ?BUN 8 - 23 mg/dL 10   13     ?Creatinine 0.44 - 1.00 mg/dL 0.62   0.64   0.81    ?Sodium 135 - 145 mmol/L 144   142     ?Potassium 3.5 - 5.1  mmol/L 3.2   3.6     ?Chloride 98 - 111 mmol/L 115   114     ?CO2 22 - 32 mmol/L 26   23     ?Calcium 8.9 - 10.3 mg/dL 7.9   8.3     ?Total Protein 6.5 - 8.1 g/dL  6.1     ?Total Bilirubin 0.3 - 1.2 mg/dL  1.2     ?Alkaline Phos 38 - 126 U/L  51     ?AST 15 - 41 U/L  16     ?ALT 0 - 44 U/L  12     ?  ? ?  Latest Ref Rng & Units 01/04/2022  ?  5:42 AM 01/02/2022  ?  5:29 PM 05/15/2020  ?  9:21 AM  ?Hepatic Function  ?Total Protein 6.5 - 8.1 g/dL 6.1   8.1   6.8    ?Albumin 3.5 - 5.0 g/dL 2.9   4.4     ?AST 15 - 41 U/L '16   27   15    ' ?ALT 0 - 44 U/L '12   15   8    ' ?Alk Phosphatase 38 - 126 U/L 51   86     ?Total Bilirubin 0.3 - 1.2 mg/dL 1.2   0.9   0.5    ?  ?ACTH is pending. ? ?Assessment: ? ?#1.  Left-sided colitis presenting with acute onset of left-sided pain and leukocytosis but no frank bleeding.  Did notice some blood yesterday.  Has not experienced diarrhea either.  She remains on empiric IV  antibiotic therapy.  Significant improvement in last 24 hours.  She is tolerating full liquid diet. ? ?#2.  Mild anemia.  No evidence of significant GI bleed.  Need to check H&H to make sure she is not having any retroperitoneal bleed.  Index of suspicion low. ? ?#3.  Adrenal glands noted to be slightly enlarged on CT with Henrene Pastor adrenal inflammatory stranding. ? ? ?Recommendations ? ?Advance diet to low fiber diet. ?CBC on blood from this morning. ?If H&H remains normal she should be able to go home this afternoon. ?Would recommend transitioning to oral antibiotic regimen for another 5 days i.e. Cipro and metronidazole or Augmentin. ?Patient will contact Dr. Evette Doffing office next week for an appointment in 3 to 4 weeks and consider having colonoscopy in 6 to 8 weeks. ? ? ? ?  ?

## 2022-01-05 NOTE — Progress Notes (Signed)
Pharmacy Antibiotic Note ? ?Lynn [name] is a 71 y.o. female admitted on 01/02/2022 with  intra-abdominal infection .  Pharmacy has been consulted for zosyn dosing. ? ?Plan: ?Zosyn 3.375g IV once IV over 30 minutes and then q8h (4 hour infusion). ?F/u cxs and clinical progress ?Monitor V/S, labs ? ?Height: '5\' 1"'$  (154.9 cm) ?Weight: 75.3 kg (166 lb 0.1 oz) ?IBW/kg (Calculated) : 47.8 ? ?Temp (24hrs), Avg:98.2 ?F (36.8 ?C), Min:97.9 ?F (36.6 ?C), Max:98.5 ?F (36.9 ?C) ? ?Recent Labs  ?Lab 01/02/22 ?1729 01/02/22 ?2216 01/03/22 ?0003 01/03/22 ?1610 01/04/22 ?9604 01/05/22 ?5409  ?WBC 20.7*  --   --  21.7* 13.5*  --   ?CREATININE 0.81  --   --  0.81 0.64 0.62  ?LATICACIDVEN  --  3.2* 2.4* 0.6 0.6  --   ? ?  ?Estimated Creatinine Clearance: 62.5 mL/min (by C-G formula based on SCr of 0.62 mg/dL).   ? ?Allergies  ?Allergen Reactions  ? Diclofenac Sodium Anaphylaxis  ? Hydrocodone Itching and Nausea And Vomiting  ? Lipitor [Atorvastatin Calcium] Other (See Comments)  ?  Increased LFT's, myalgias  ? Niacin Other (See Comments)  ?  Flushing  ? Other Other (See Comments)  ?  Allergic to metal states body rejects it  ? Zolpidem Other (See Comments)  ?  Severe headache  ? ? ?Antimicrobials this admission: ?zosyn 4/20 >>  ?Vancomycin  4/19 >> 4/20 ?Cefepime 4/19> 4/20 ? ?Microbiology results: ?4.29 BCx: pending ? ?Thank you for allowing pharmacy to be a part of this patient?s care. ? ?Hart Robinsons, PharmD ?Clinical Pharmacist ? ?01/05/2022 9:51 AM ? ?

## 2022-01-05 NOTE — Discharge Summary (Addendum)
?Physician Discharge Summary ?  ?Patient: Lynn [name] MRN: [id number] DOB: 25-Nov-1950  ?Admit date:     01/02/2022  ?Discharge date: 01/05/22  ?Discharge Physician: Shanon Brow Sabryna Lahm  ? ?PCP: Nicholes Rough, PA-C  ? ?Recommendations at discharge:  ? Please follow up with primary care provider within 1-2 weeks ? Please repeat BMP and CBC in one week ? ? ? ? ?Hospital Course: ?71 year old female with a history of hypertension, PTSD, hyperlipidemia, depression, GERD, hyperlipidemia presenting with 2-week history of upper abdominal pain that has worsened over the past 2 days.  She has had intermittent nausea and vomiting and dry heaving.  She has not had any diarrhea, hematochezia, melena.  She states that she has felt somewhat constipated.  She had small bowel movement prior to coming to the hospital on 01/02/2022, but she described it as only "rabbit turds".  Her last bowel movement prior to that was about 2 weeks prior to this admission.  She has had some subjective fevers and chills, but denies any headache, neck pain, chest pain, shortness of breath, hematemesis.  There is no dysuria or hematuria.  She states that she has some abdominal fullness and pressure in the upper abdomen when she eats.  She denies any alcohol or NSAIDs or any new medications.  She quit smoking 2-1/2 years ago after 25-pack-year history. ?In the ED, the patient was afebrile hemodynamically stable with oxygen saturation 96% on 1 L.  WBC 20.7, hemoglobin 15.4, platelets 220,000.  Sodium 130, potassium 3.3, bicarb 25, serum creatinine 0.1.  LFTs were unremarkable with lipase 34.  UA was negative for pyuria.  CT of the abdomen and pelvis showed adrenal gland thickening and enlargement with periadrenal enhancement.  There is also inflammatory changes of the descending and sigmoid colon.  The patient was started on IV fluids and IV Zosyn.  General surgery was consulted to assist with management.  GI was also consulted to assist with  management. ? ?Assessment and Plan: ?* Ischemic colitis ([place]) ?GI consult appreciated>>continue current medical therapy ?Continue empiric Zosyn>>clinically improving ?Lactic acid peaked at 3.2>> 2.4>>0.6 ?Clear liquid diet>>full liquids>>soft diet which she tolerated ?Continue IV fluids ?D/c home with amox/clav x 5 more days ? ?Adrenal abnormality ([place]) ?Suspect this is related to ASVD/ischemia ?-am cortisol--14.7 ?-No clinical signs of adrenal crisis presently ?-will ultimately need outpt endocrine follow up>>referral made to Dr. Dorris Fetch outpt ? ?Hypokalemia ?Repleted ?Check magnesium 2.0 ? ?Depression ?Continue Paxil ?PDMP reviewed--patient receives alprazolam 1 mg, #60 monthly ?-- Last refill 11/28/2021 ? ?Obesity ?BMI 31.18 ?Lifestyle modification ? ?Mixed hyperlipidemia ?Continue statin ? ?GERD (gastroesophageal reflux disease) ?Continue PPI ? ?Essential hypertension ?Holding amlodipine and benazepril due to soft BPs initially>>restart at d/c ?Monitor clinically ? ? ? ? ?  ? ? ?Consultants: GI, general surgery ?Procedures performed: none  ?Disposition: Home ?Diet recommendation:  ?Soft diet ?DISCHARGE MEDICATION: ?Allergies as of 01/05/2022   ? ?   Reactions  ? Diclofenac Sodium Anaphylaxis  ? Hydrocodone Itching, Nausea And Vomiting  ? Lipitor [atorvastatin Calcium] Other (See Comments)  ? Increased LFT's, myalgias  ? Niacin Other (See Comments)  ? Flushing  ? Other Other (See Comments)  ? Allergic to metal states body rejects it  ? Zolpidem Other (See Comments)  ? Severe headache  ? ?  ? ?  ?Medication List  ?  ? ?STOP taking these medications   ? ?traMADol 50 MG tablet ?Commonly known as: ULTRAM ?  ?triamcinolone cream 0.1 % ?Commonly known as: KENALOG ?  ? ?  ? ?  TAKE these medications   ? ?ALPRAZolam 1 MG tablet ?Commonly known as: Duanne Moron ?Take 1 tablet (1 mg total) by mouth 2 (two) times daily. ?  ?amLODipine 10 MG tablet ?Commonly known as: NORVASC ?Take 1 tablet (10 mg total) by mouth daily. ?   ?amoxicillin-clavulanate 875-125 MG tablet ?Commonly known as: AUGMENTIN ?Take 1 tablet by mouth every 12 (twelve) hours. ?  ?benazepril 10 MG tablet ?Commonly known as: LOTENSIN ?Take 10 mg by mouth every morning. ?  ?cholecalciferol 1000 units tablet ?Commonly known as: VITAMIN D ?Take 2,000 Units by mouth 2 (two) times daily. ?  ?Dexilant 60 MG capsule ?Generic drug: dexlansoprazole ?TAKE 1 CAPSULE BY MOUTH EVERY DAY ?  ?famotidine 20 MG tablet ?Commonly known as: PEPCID ?Take 20 mg by mouth 2 (two) times daily. ?  ?latanoprost 0.005 % ophthalmic solution ?Commonly known as: XALATAN ?1 drop at bedtime. ?  ?ondansetron 4 MG disintegrating tablet ?Commonly known as: ZOFRAN-ODT ?TAKE 1 TABLET BY MOUTH EVERY 8 HOURS AS NEEDED FOR NAUSEA AND VOMITING ?  ?ondansetron 4 MG tablet ?Commonly known as: ZOFRAN ?Take 1 tablet (4 mg total) by mouth every 8 (eight) hours as needed for nausea or vomiting. ?  ?PARoxetine 40 MG tablet ?Commonly known as: PAXIL ?TAKE 1 TABLET BY MOUTH EVERY DAY ?  ?polyethylene glycol powder 17 GM/SCOOP powder ?Commonly known as: GLYCOLAX/MIRALAX ?Take 17 g by mouth 2 (two) times daily as needed. ?  ?pravastatin 80 MG tablet ?Commonly known as: PRAVACHOL ?Take 1 tablet (80 mg total) by mouth daily. ?  ? ?  ? ? ?Discharge Exam: ?Filed Weights  ? 01/02/22 1609 01/03/22 1447  ?Weight: 74.8 kg 75.3 kg  ? ?HEENT:  [place]/AT, No thrush, no icterus ?CV:  RRR, no rub, no S3, no S4 ?Lung:  CTA, no wheeze, no rhonchi ?Abd:  soft/+BS, NT ?Ext:  No edema, no lymphangitis, no synovitis, no rash ? ? ?Condition at discharge: stable ? ?The results of significant diagnostics from this hospitalization (including imaging, microbiology, ancillary and laboratory) are listed below for reference.  ? ?Imaging Studies: ?DG Abdomen 1 View ? ?Result Date: 01/02/2022 ?CLINICAL DATA:  Constipation and abdominal pain EXAM: ABDOMEN - 1 VIEW COMPARISON:  None. FINDINGS: The bowel gas pattern is normal. No radio-opaque calculi or  other significant radiographic abnormality are seen. IMPRESSION: Negative. Electronically Signed   By: Ulyses Jarred [name].   On: 01/02/2022 19:54  ? ?CT Abdomen Pelvis W Contrast ? ?Result Date: 01/02/2022 ?CLINICAL DATA:  Abdominal pain, acute, nonlocalized EXAM: CT ABDOMEN AND PELVIS WITH CONTRAST TECHNIQUE: Multidetector CT imaging of the abdomen and pelvis was performed using the standard protocol following bolus administration of intravenous contrast. RADIATION DOSE REDUCTION: This exam was performed according to the departmental dose-optimization program which includes automated exposure control, adjustment of the mA and/or kV according to patient size and/or use of iterative reconstruction technique. CONTRAST:  151m OMNIPAQUE IOHEXOL 300 MG/ML  SOLN COMPARISON:  10/01/2019 FINDINGS: Lower chest: Small atrial septal defect (image # 8/2). Calcification of the aortic valve leaflets noted. Cardiac size is within normal limits. Visualized lung bases are clear. Hepatobiliary: No focal liver abnormality is seen. No gallstones, gallbladder wall thickening, or biliary dilatation. Pancreas: Unremarkable Spleen: The spleen appears relatively small and multilobulated with prominent clefts which may reflect the sequela of remote infarction or trauma. Normal enhancement of the residual spleen. Adrenals/Urinary Tract: The adrenal glands are slightly thickened and enlarged when compared to prior examination. The glands demonstrate preserved enhancement with slight washout  on delayed images. There is extensive periadrenal inflammatory stranding. Altogether, these findings may reflect changes of adrenal congestion, a finding that may proceed nontraumatic adrenal hemorrhage. The kidneys are normal.  The bladder is unremarkable. Stomach/Bowel: There is mild pericolonic inflammatory stranding and subtle circumferential bowel wall thickening involving the descending and proximal sigmoid colon which may reflect changes of acute  infectious, inflammatory, or ischemic colitis. The stomach, small bowel, and large bowel are otherwise unremarkable. No obstruction. Appendix normal. No free intraperitoneal gas or fluid. Vascular/Lymphatic: Moderate aortoiliac atherosclerotic ca

## 2022-01-05 NOTE — Progress Notes (Signed)
Nursing Discharge Note ?  ?Admit Date: 01/02/2022  ? ?Discharge date: 01/05/2022 ?  ?Lynn [name] is to be discharged home per MD order.  AVS completed. Reviewed with patient and daughter at bedside. Highlighted copy provided for patient to take home.  ?Patient able to verbalize understanding of discharge instructions. PIV removed. Patient stable upon discharge.  ? ?Allergies as of 01/05/2022   ? ?   Reactions  ? Diclofenac Sodium Anaphylaxis  ? Hydrocodone Itching, Nausea And Vomiting  ? Lipitor [atorvastatin Calcium] Other (See Comments)  ? Increased LFT's, myalgias  ? Niacin Other (See Comments)  ? Flushing  ? Other Other (See Comments)  ? Allergic to metal states body rejects it  ? Zolpidem Other (See Comments)  ? Severe headache  ? ?  ? ?  ?Medication List  ?  ? ?STOP taking these medications   ? ?traMADol 50 MG tablet ?Commonly known as: ULTRAM ?  ?triamcinolone cream 0.1 % ?Commonly known as: KENALOG ?  ? ?  ? ?TAKE these medications   ? ?ALPRAZolam 1 MG tablet ?Commonly known as: Duanne Moron ?Take 1 tablet (1 mg total) by mouth 2 (two) times daily. ?  ?amLODipine 10 MG tablet ?Commonly known as: NORVASC ?Take 1 tablet (10 mg total) by mouth daily. ?  ?amoxicillin-clavulanate 875-125 MG tablet ?Commonly known as: AUGMENTIN ?Take 1 tablet by mouth every 12 (twelve) hours. ?  ?benazepril 10 MG tablet ?Commonly known as: LOTENSIN ?Take 10 mg by mouth every morning. ?  ?cholecalciferol 1000 units tablet ?Commonly known as: VITAMIN D ?Take 2,000 Units by mouth 2 (two) times daily. ?  ?Dexilant 60 MG capsule ?Generic drug: dexlansoprazole ?TAKE 1 CAPSULE BY MOUTH EVERY DAY ?  ?famotidine 20 MG tablet ?Commonly known as: PEPCID ?Take 20 mg by mouth 2 (two) times daily. ?  ?latanoprost 0.005 % ophthalmic solution ?Commonly known as: XALATAN ?1 drop at bedtime. ?  ?ondansetron 4 MG disintegrating tablet ?Commonly known as: ZOFRAN-ODT ?TAKE 1 TABLET BY MOUTH EVERY 8 HOURS AS NEEDED FOR NAUSEA AND VOMITING ?  ?ondansetron 4 MG  tablet ?Commonly known as: ZOFRAN ?Take 1 tablet (4 mg total) by mouth every 8 (eight) hours as needed for nausea or vomiting. ?  ?PARoxetine 40 MG tablet ?Commonly known as: PAXIL ?TAKE 1 TABLET BY MOUTH EVERY DAY ?  ?polyethylene glycol powder 17 GM/SCOOP powder ?Commonly known as: GLYCOLAX/MIRALAX ?Take 17 g by mouth 2 (two) times daily as needed. ?  ?pravastatin 80 MG tablet ?Commonly known as: PRAVACHOL ?Take 1 tablet (80 mg total) by mouth daily. ?  ? ?  ?  ? ?Discharge Instructions/ Education: ?Discharge instructions given to patient/family with verbalized understanding. ?Discharge education completed with patient/family including: follow up instructions, medication list, discharge activities, and limitations if indicated. Patient escorted to lobby via wheelchair to lobby and discharged home via private automobile.  ? ?

## 2022-01-07 LAB — ACTH: C206 ACTH: 5.3 pg/mL — ABNORMAL LOW (ref 7.2–63.3)

## 2022-01-09 LAB — CULTURE, BLOOD (ROUTINE X 2)
Culture: NO GROWTH
Culture: NO GROWTH
Special Requests: ADEQUATE
Special Requests: ADEQUATE

## 2022-01-10 ENCOUNTER — Telehealth: Payer: Self-pay | Admitting: "Endocrinology

## 2022-01-10 NOTE — Telephone Encounter (Signed)
-----   Message from Cassandria Anger, MD sent at 01/09/2022  4:50 PM EDT ----- ?Regarding: FW: Adrenal abnormality ?For scheduling a routine consult . Thanks. ?----- Message ----- ?From: Orson Eva, MD ?Sent: 01/05/2022   4:52 PM EDT ?To: Cassandria Anger, MD ?Subject: Adrenal abnormality                           ? ?Can you please see this patient after hospital discharge? ?She had CT abd/pelvis with bilateral adrenal thickening and peri-adrenal inflammatory stranding. ?No signs of adrenal crises during hospitalization.  Am cortisol 14.7 ? ?Thank you, ? ?Shanon Brow ? ? ?

## 2022-01-10 NOTE — Telephone Encounter (Signed)
Tried to contact patient to set up new pt appt. No answer. Schedule as a routine new patient appt per Dr Dorris Fetch.  ?

## 2022-01-30 ENCOUNTER — Encounter: Payer: Self-pay | Admitting: Internal Medicine

## 2022-01-30 ENCOUNTER — Ambulatory Visit (INDEPENDENT_AMBULATORY_CARE_PROVIDER_SITE_OTHER): Payer: Medicare Other | Admitting: Internal Medicine

## 2022-01-30 VITALS — BP 124/62 | HR 60 | Ht 61.0 in | Wt 158.6 lb

## 2022-01-30 DIAGNOSIS — K559 Vascular disorder of intestine, unspecified: Secondary | ICD-10-CM | POA: Diagnosis not present

## 2022-01-30 DIAGNOSIS — K581 Irritable bowel syndrome with constipation: Secondary | ICD-10-CM

## 2022-01-30 MED ORDER — PLENVU 140 G PO SOLR: 1.0000 | ORAL | 0 refills | Status: DC
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
## 2022-01-30 NOTE — Patient Instructions (Signed)
You have been scheduled for a colonoscopy. Please follow written instructions given to you at your visit today.  ?Please pick up your prep supplies at the pharmacy within the next 1-3 days. ?If you use inhalers (even only as needed), please bring them with you on the day of your procedure. ? ?Take miralax daily. Titrate it up/down as needed. ? ?I appreciate the opportunity to care for you. ?Silvano Rusk, MD, [facility] ?

## 2022-02-13 ENCOUNTER — Encounter: Payer: Self-pay | Admitting: Internal Medicine

## 2022-02-13 ENCOUNTER — Ambulatory Visit (AMBULATORY_SURGERY_CENTER): Payer: Medicare Other | Admitting: Internal Medicine

## 2022-02-13 VITALS — BP 141/73 | HR 94 | Temp 97.8°F | Resp 15 | Ht 61.0 in | Wt 158.0 lb

## 2022-02-13 DIAGNOSIS — K644 Residual hemorrhoidal skin tags: Secondary | ICD-10-CM

## 2022-02-13 DIAGNOSIS — K5730 Diverticulosis of large intestine without perforation or abscess without bleeding: Secondary | ICD-10-CM | POA: Diagnosis not present

## 2022-02-13 DIAGNOSIS — D123 Benign neoplasm of transverse colon: Secondary | ICD-10-CM | POA: Diagnosis not present

## 2022-02-13 DIAGNOSIS — D124 Benign neoplasm of descending colon: Secondary | ICD-10-CM | POA: Diagnosis not present

## 2022-02-13 DIAGNOSIS — D125 Benign neoplasm of sigmoid colon: Secondary | ICD-10-CM

## 2022-02-13 DIAGNOSIS — K559 Vascular disorder of intestine, unspecified: Secondary | ICD-10-CM

## 2022-02-13 DIAGNOSIS — R933 Abnormal findings on diagnostic imaging of other parts of digestive tract: Secondary | ICD-10-CM

## 2022-02-13 MED ORDER — SODIUM CHLORIDE 0.9 % IV SOLN: 500.0000 mL | Freq: Once | INTRAVENOUS | Status: DC

## 2022-02-13 NOTE — Progress Notes (Signed)
History and Physical Interval Note:  02/13/2022 8:45 AM  Lynn [name]  has presented today for endoscopic procedure(s), with the diagnosis of  Encounter Diagnosis  Name Primary?   Ischemic colitis ([place]) Yes  .  The various methods of evaluation and treatment have been discussed with the patient and/or family. After consideration of risks, benefits and other options for treatment, the patient has consented to  the endoscopic procedure(s).   The patient's history has been reviewed, patient examined, no change in status, stable for endoscopic procedure(s).  I have reviewed the patient's chart and labs.  Questions were answered to the patient's satisfaction.     [name], MD, [facility]

## 2022-02-13 NOTE — Progress Notes (Signed)
Called to room to assist during endoscopic procedure.  Patient ID and intended procedure confirmed with present staff. Received instructions for my participation in the procedure from the performing physician.  

## 2022-02-13 NOTE — Progress Notes (Signed)
Pt's states no medical or surgical changes since previsit or office visit. 

## 2022-02-13 NOTE — Op Note (Addendum)
[place] Patient Name: Lynn [name] Procedure Date: 02/13/2022 8:46 AM MRN: [id number] Endoscopist: [name] , MD Age: 71 Referring MD:  Date of Birth: September 24, 1950 Gender: Female Account #: [id number] Procedure:                Colonoscopy Indications:              Abnormal CT of the GI tract - ischemic colitis Medicines:                Monitored Anesthesia Care Procedure:                Pre-Anesthesia Assessment:                           - Prior to the procedure, a History and Physical                            was performed, and patient medications and                            allergies were reviewed. The patient's tolerance of                            previous anesthesia was also reviewed. The risks                            and benefits of the procedure and the sedation                            options and risks were discussed with the patient.                            All questions were answered, and informed consent                            was obtained. Prior Anticoagulants: The patient has                            taken no previous anticoagulant or antiplatelet                            agents. ASA Grade Assessment: II - A patient with                            mild systemic disease. After reviewing the risks                            and benefits, the patient was deemed in                            satisfactory condition to undergo the procedure.                           After obtaining informed consent, the colonoscope  was passed under direct vision. Throughout the                            procedure, the patient's blood pressure, pulse, and                            oxygen saturations were monitored continuously. The                            Olympus PCF-H190DL (#[id number]) Colonoscope was                            introduced through the anus and advanced to the the                            cecum,  identified by appendiceal orifice and                            ileocecal valve. The colonoscopy was performed                            without difficulty. The patient tolerated the                            procedure well. The quality of the bowel                            preparation was good. The bowel preparation used                            was Plenvu via split dose instruction. The                            ileocecal valve, appendiceal orifice, and rectum                            were photographed. Scope In: 8:54:54 AM Scope Out: 9:08:43 AM Scope Withdrawal Time: 0 hours 10 minutes 16 seconds  Total Procedure Duration: 0 hours 13 minutes 49 seconds  Findings:                 Skin tags were found on perianal exam.                           The digital rectal exam findings include decreased                            sphincter tone. Exagerrated descent with simulated                            defecation. [pertinent negatives].                           Four sessile polyps were found in the sigmoid  colon, descending colon and transverse colon. The                            polyps were diminutive in size. These polyps were                            removed with a cold snare. Resection and retrieval                            were complete. Verification of patient                            identification for the specimen was done. Estimated                            blood loss was minimal.                           Diverticula were found in the sigmoid colon.                           The exam was otherwise without abnormality on                            direct and retroflexion views. Complications:            No immediate complications. Estimated Blood Loss:     Estimated blood loss was minimal. Impression:               - Perianal skin tags found on perianal exam.                           - Decreased sphincter tone found on digital rectal                             exam.                           - Four diminutive polyps in the sigmoid colon, in                            the descending colon and in the transverse colon,                            removed with a cold snare. Resected and retrieved.                           - Diverticulosis in the sigmoid colon.                           - The examination was otherwise normal on direct                            and retroflexion views.                           -  Personal history of colonic polyps. 5 diminutive                            adenomas 2021 Recommendation:           - Patient has a contact number available for                            emergencies. The signs and symptoms of potential                            delayed complications were discussed with the                            patient. Return to normal activities tomorrow.                            Written discharge instructions were provided to the                            patient.                           - Resume previous diet.                           - Continue present medications.                           - Await pathology results.                           - Repeat colonoscopy is recommended for                            surveillance. The colonoscopy date will be                            determined after pathology results from today's                            exam become available for review.                           - Rectal exam reveals reduced resting and voluntary                            tone and increased descent with simulated                            defecation. - I have recommended she try pelvic                            floor PT for constipation and defecation problems.  If she is interested we will refer. [name], MD 02/13/2022 9:19:08 AM This report has been signed electronically.

## 2022-02-13 NOTE — Progress Notes (Signed)
To pacu, VSS. Report to [email or web address] 

## 2022-02-13 NOTE — Patient Instructions (Addendum)
No inflammation seen. I did find and remove 4 tiny polyps. I will let you know pathology results and when to have another routine colonoscopy by mail and/or My Chart.  I think your bowel movement issues are related to weak muscles in the pelvis, anus and rectum. There is a special form of physical therapy called pelvic floor PT that can help.  It is available in [place] and I can make a referral if interested. Please let me know.  I appreciate the opportunity to care for you. [name], MD, [facility]   Thank you for letting us take care of your healthcare needs today. Please see handouts given to you on Polyps.   YOU HAD AN ENDOSCOPIC PROCEDURE TODAY AT [facility]:   Refer to the procedure report that was given to you for any specific questions about what was found during the examination.  If the procedure report does not answer your questions, please call your gastroenterologist to clarify.  If you requested that your care partner not be given the details of your procedure findings, then the procedure report has been included in a sealed envelope for you to review at your convenience later.  YOU SHOULD EXPECT: Some feelings of bloating in the abdomen. Passage of more gas than usual.  Walking can help get rid of the air that was put into your GI tract during the procedure and reduce the bloating. If you had a lower endoscopy (such as a colonoscopy or flexible sigmoidoscopy) you may notice spotting of blood in your stool or on the toilet paper. If you underwent a bowel prep for your procedure, you may not have a normal bowel movement for a few days.  Please Note:  You might notice some irritation and congestion in your nose or some drainage.  This is from the oxygen used during your procedure.  There is no need for concern and it should clear up in a day or so.  SYMPTOMS TO REPORT IMMEDIATELY:  Following lower endoscopy (colonoscopy or flexible  sigmoidoscopy):  Excessive amounts of blood in the stool  Significant tenderness or worsening of abdominal pains  Swelling of the abdomen that is new, acute  Fever of 100F or higher   For urgent or emergent issues, a gastroenterologist can be reached at any hour by calling [phone]. Do not use MyChart messaging for urgent concerns.    DIET:  We do recommend a small meal at first, but then you may proceed to your regular diet.  Drink plenty of fluids but you should avoid alcoholic beverages for 24 hours.  ACTIVITY:  You should plan to take it easy for the rest of today and you should NOT DRIVE or use heavy machinery until tomorrow (because of the sedation medicines used during the test).    FOLLOW UP: Our staff will call the number listed on your records 48-72 hours following your procedure to check on you and address any questions or concerns that you may have regarding the information given to you following your procedure. If we do not reach you, we will leave a message.  We will attempt to reach you two times.  During this call, we will ask if you have developed any symptoms of COVID 19. If you develop any symptoms (ie: fever, flu-like symptoms, shortness of breath, cough etc.) before then, please call [phone].  If you test positive for Covid 19 in the 2 weeks post procedure, please call and report this information to [place].  If any biopsies were taken you will be contacted by phone or by letter within the next 1-3 weeks.  Please call us at [phone] if you have not heard about the biopsies in 3 weeks.    SIGNATURES/CONFIDENTIALITY: You and/or your care partner have signed paperwork which will be entered into your electronic medical record.  These signatures attest to the fact that that the information above on your After Visit Summary has been reviewed and is understood.  Full responsibility of the confidentiality of this discharge information lies with you and/or your  care-partner.

## 2022-02-14 ENCOUNTER — Telehealth: Payer: Self-pay

## 2022-02-14 ENCOUNTER — Telehealth: Payer: Self-pay | Admitting: *Deleted

## 2022-02-14 ENCOUNTER — Other Ambulatory Visit: Payer: Self-pay

## 2022-02-14 DIAGNOSIS — K5900 Constipation, unspecified: Secondary | ICD-10-CM

## 2022-02-14 DIAGNOSIS — K6289 Other specified diseases of anus and rectum: Secondary | ICD-10-CM

## 2022-02-14 NOTE — Telephone Encounter (Signed)
-----   Message from [name], MD sent at 02/13/2022 11:34 AM EDT ----- Regarding: Pelvic Floor PT referral Please refer to [facility]  Dx constipation and anal sphincter weakness

## 2022-02-14 NOTE — Telephone Encounter (Signed)
Referral  to [facility] for pelvic floor therapy entered into Epic Tried to contact pt and make aware. Unable to reach pt, No voice mail available.

## 2022-02-14 NOTE — Telephone Encounter (Signed)
  Follow up Call-     02/13/2022    7:42 AM 03/22/2020    8:39 AM  Call back number  Post procedure Call Back phone  # [phone]  Permission to leave phone message Yes Yes     Patient questions:  Do you have a fever, pain , or abdominal swelling? No. Pain Score  0 *  Have you tolerated food without any problems? Yes.    Have you been able to return to your normal activities? Yes.    Do you have any questions about your discharge instructions: Diet   No. Medications  No. Follow up visit  No.  Do you have questions or concerns about your Care? No.  Actions: * If pain score is 4 or above: No action needed, pain <4.

## 2022-02-19 ENCOUNTER — Encounter: Payer: Self-pay | Admitting: Internal Medicine

## 2022-02-19 DIAGNOSIS — Z860101 Personal history of adenomatous and serrated colon polyps: Secondary | ICD-10-CM

## 2022-02-19 DIAGNOSIS — Z86010 Personal history of colonic polyps: Secondary | ICD-10-CM | POA: Insufficient documentation

## 2022-02-19 HISTORY — DX: Personal history of adenomatous and serrated colon polyps: Z86.0101

## 2022-02-19 HISTORY — DX: Personal history of colonic polyps: Z86.010

## 2022-02-25 NOTE — Telephone Encounter (Signed)
Pt was made aware of Dr. Carlean Purl recommendations: Pt was made aware that the referral has been sent in to [facility] for physical therapy. Pt stated that they have already contacted her and she has an appointment for the 14th: Pt verbalized understanding with all questions answered.

## 2022-02-27 ENCOUNTER — Ambulatory Visit: Payer: Medicare Other | Admitting: Physical Therapy

## 2023-02-12 ENCOUNTER — Ambulatory Visit (HOSPITAL_COMMUNITY): Payer: 59

## 2023-06-20 ENCOUNTER — Ambulatory Visit: Payer: 59 | Admitting: Cardiology
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
## 2023-09-08 ENCOUNTER — Ambulatory Visit: Payer: 59 | Admitting: Cardiology

## 2023-09-16 ENCOUNTER — Encounter: Payer: Self-pay | Admitting: Cardiology

## 2023-12-01 ENCOUNTER — Encounter: Payer: Self-pay | Admitting: "Endocrinology

## 2023-12-01 ENCOUNTER — Ambulatory Visit (INDEPENDENT_AMBULATORY_CARE_PROVIDER_SITE_OTHER): Payer: Medicare Other | Admitting: "Endocrinology

## 2023-12-01 VITALS — BP 114/76 | HR 64 | Ht 61.0 in | Wt 145.8 lb

## 2023-12-01 DIAGNOSIS — E279 Disorder of adrenal gland, unspecified: Secondary | ICD-10-CM | POA: Diagnosis not present
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
## 2023-12-04 LAB — CORTISOL-AM, BLOOD: Cortisol - AM: 16.1 ug/dL (ref 6.2–19.4)

## 2023-12-22 ENCOUNTER — Ambulatory Visit: Admitting: "Endocrinology

## 2023-12-27 ENCOUNTER — Ambulatory Visit (HOSPITAL_COMMUNITY)
Admission: RE | Admit: 2023-12-27 | Discharge: 2023-12-27 | Disposition: A | Discharge status: Home / Self Care | Source: Ambulatory Visit | Attending: "Endocrinology | Admitting: "Endocrinology

## 2023-12-27 DIAGNOSIS — R935 Abnormal findings on diagnostic imaging of other abdominal regions, including retroperitoneum: Secondary | ICD-10-CM | POA: Insufficient documentation

## 2023-12-27 DIAGNOSIS — E279 Disorder of adrenal gland, unspecified: Secondary | ICD-10-CM | POA: Diagnosis present

## 2023-12-27 MED ORDER — IOHEXOL 300 MG/ML  SOLN
100.0000 mL | Freq: Once | INTRAMUSCULAR | Status: AC | PRN
  Administered 2023-12-27: 100 mL via INTRAVENOUS

## 2023-12-27 MED ORDER — SODIUM CHLORIDE (PF) 0.9 % IJ SOLN
INTRAMUSCULAR | Status: AC
  Filled 2023-12-27: qty 50

## 2023-12-30 ENCOUNTER — Ambulatory Visit: Admitting: "Endocrinology

## 2024-01-21 ENCOUNTER — Ambulatory Visit (INDEPENDENT_AMBULATORY_CARE_PROVIDER_SITE_OTHER): Admitting: "Endocrinology

## 2024-01-21 ENCOUNTER — Encounter: Payer: Self-pay | Admitting: "Endocrinology

## 2024-01-21 VITALS — BP 120/76 | HR 60 | Ht 61.0 in | Wt 148.6 lb

## 2024-01-21 DIAGNOSIS — E279 Disorder of adrenal gland, unspecified: Secondary | ICD-10-CM | POA: Diagnosis not present
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
## 2024-04-27 ENCOUNTER — Ambulatory Visit: Admitting: Internal Medicine
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
## 2024-08-26 ENCOUNTER — Encounter: Payer: Self-pay | Admitting: Cardiology

## 2024-08-26 ENCOUNTER — Ambulatory Visit: Attending: Internal Medicine | Admitting: Cardiology

## 2024-08-26 VITALS — BP 127/73 | HR 69 | Ht 61.0 in | Wt 150.0 lb

## 2024-08-26 DIAGNOSIS — E7800 Pure hypercholesterolemia, unspecified: Secondary | ICD-10-CM

## 2024-08-26 DIAGNOSIS — R072 Precordial pain: Secondary | ICD-10-CM | POA: Diagnosis not present

## 2024-08-26 DIAGNOSIS — I359 Nonrheumatic aortic valve disorder, unspecified: Secondary | ICD-10-CM

## 2024-08-26 DIAGNOSIS — I10 Essential (primary) hypertension: Secondary | ICD-10-CM | POA: Diagnosis not present

## 2024-08-26 MED ORDER — METOPROLOL TARTRATE 100 MG PO TABS: ORAL_TABLET | ORAL | 0 refills | Status: AC

## 2024-08-26 NOTE — Patient Instructions (Addendum)
 Testing/Procedures:  Your physician has requested that you have an echocardiogram. Echocardiography is a painless test that uses sound waves to create images of your heart. It provides your doctor with information about the size and shape of your heart and how well your hearts chambers and valves are working. This procedure takes approximately one hour. There are no restrictions for this procedure. Please do NOT wear cologne, perfume, aftershave, or lotions (deodorant is allowed). Please arrive 15 minutes prior to your appointment time.  Please note: We ask at that you not bring children with you during ultrasound (echo/ vascular) testing. Due to room size and safety concerns, children are not allowed in the ultrasound rooms during exams. Our front office staff cannot provide observation of children in our lobby area while testing is being conducted. An adult accompanying a patient to their appointment will only be allowed in the ultrasound room at the discretion of the ultrasound technician under special circumstances. We apologize for any inconvenience. [address]  Follow-Up: At [facility], you and your health needs are our priority.  As part of our continuing mission to provide you with exceptional heart care, our providers are all part of one team.  This team includes your primary Cardiologist (physician) and Advanced Practice Providers or APPs (Physician Assistants and Nurse Practitioners) who all work together to provide you with the care you need, when you need it.  Your next appointment:   6 month(s)  Provider:   REDELL SHALLOW MD   We recommend signing up for the patient portal called MyChart.  Sign up information is provided on this After Visit Summary.  MyChart is used to connect with patients for Virtual Visits (Telemedicine).  Patients are able to view lab/test results, encounter notes, upcoming appointments, etc.  Non-urgent messages can be sent to your provider  as well.   To learn more about what you can do with MyChart, go to [email or web address].   Other Instructions   Your cardiac CT will be scheduled at    [facility] [address]  [place], [place] [address]    If scheduled at the Heart and Vascular Tower at [address], please enter the parking lot using the [address] entrance and use the FREE valet service at the patient drop-off area. Enter the building and check-in with registration on the main floor.   Please follow these instructions carefully (unless otherwise directed):  An IV will be required for this test and Nitroglycerin will be given.    On the Night Before the Test: Be sure to Drink plenty of water . Do not consume any caffeinated/decaffeinated beverages or chocolate 12 hours prior to your test. Do not take any antihistamines 12 hours prior to your test.   On the Day of the Test: Drink plenty of water  until 1 hour prior to the test. Do not eat any food 1 hour prior to test. You may take your regular medications prior to the test.  Take metoprolol (Lopressor) 100 MG two hours prior to test. FEMALES- please wear underwire-free bra if available, avoid dresses & tight clothing  After the Test: Drink plenty of water . After receiving IV contrast, you may experience a mild flushed feeling. This is normal. On occasion, you may experience a mild rash up to 24 hours after the test. This is not dangerous. If this occurs, you can take Benadryl  25 mg, Zyrtec , Claritin, or Allegra and increase your fluid intake. (Patients taking Tikosyn should avoid Benadryl ,  and may take Zyrtec , Claritin, or Allegra) If you experience trouble breathing, this can be serious. If it is severe call 911 IMMEDIATELY. If it is mild, please call our office.  We will call to schedule your test 2-4 weeks out understanding that some insurance companies will need an authorization prior to the service being  performed.   For more information and frequently asked questions, please visit our website : [email or web address]  For non-scheduling related questions, please contact the cardiac imaging nurse navigator should you have any questions/concerns: Cardiac Imaging Nurse Navigators Direct Office Dial: [phone]   For scheduling needs, including cancellations and rescheduling, please call Brittany, [phone].

## 2024-09-13 ENCOUNTER — Telehealth (HOSPITAL_COMMUNITY): Payer: Self-pay | Admitting: *Deleted

## 2024-09-13 NOTE — Telephone Encounter (Signed)
 Reaching out to patient to offer assistance regarding upcoming cardiac imaging study; pt verbalizes understanding of appt date/time, parking situation and where to check in, pre-test NPO status and medications ordered, and verified current allergies; name and call back number provided for further questions should they arise Sid Seats RN Navigator Cardiac Imaging [facility] [phone] office [phone] cell

## 2024-09-14 ENCOUNTER — Ambulatory Visit (HOSPITAL_COMMUNITY)
Admission: RE | Admit: 2024-09-14 | Discharge: 2024-09-14 | Disposition: A | Discharge status: Home / Self Care | Source: Ambulatory Visit | Attending: Cardiology | Admitting: Cardiology

## 2024-09-14 DIAGNOSIS — R072 Precordial pain: Secondary | ICD-10-CM | POA: Diagnosis not present

## 2024-09-14 MED ORDER — NITROGLYCERIN 0.4 MG SL SUBL
0.8000 mg | SUBLINGUAL_TABLET | Freq: Once | SUBLINGUAL | Status: AC
  Administered 2024-09-14: 0.8 mg via SUBLINGUAL

## 2024-09-14 MED ORDER — IOHEXOL 350 MG/ML SOLN
100.0000 mL | Freq: Once | INTRAVENOUS | Status: AC | PRN
  Administered 2024-09-14: 100 mL via INTRAVENOUS

## 2024-09-15 ENCOUNTER — Other Ambulatory Visit (HOSPITAL_BASED_OUTPATIENT_CLINIC_OR_DEPARTMENT_OTHER): Payer: Self-pay | Admitting: Cardiology

## 2024-09-15 ENCOUNTER — Ambulatory Visit (HOSPITAL_COMMUNITY)
Admission: RE | Admit: 2024-09-15 | Discharge: 2024-09-15 | Disposition: A | Discharge status: Home / Self Care | Source: Ambulatory Visit | Attending: Cardiology | Admitting: Cardiology

## 2024-09-15 ENCOUNTER — Ambulatory Visit: Payer: Self-pay | Admitting: Cardiology

## 2024-09-15 DIAGNOSIS — R931 Abnormal findings on diagnostic imaging of heart and coronary circulation: Secondary | ICD-10-CM

## 2024-09-15 DIAGNOSIS — I359 Nonrheumatic aortic valve disorder, unspecified: Secondary | ICD-10-CM

## 2024-09-17 MED ORDER — ASPIRIN 81 MG PO TBEC: 81.0000 mg | DELAYED_RELEASE_TABLET | Freq: Every day | ORAL | Status: AC

## 2024-09-17 NOTE — Telephone Encounter (Signed)
Patient is returning phone call. Please advise.

## 2024-09-20 ENCOUNTER — Telehealth: Payer: Self-pay | Admitting: *Deleted

## 2024-09-20 DIAGNOSIS — E7800 Pure hypercholesterolemia, unspecified: Secondary | ICD-10-CM

## 2024-09-20 NOTE — Telephone Encounter (Signed)
 Patient aware of coronary CTA results. She had labs drawn by her medical doctor 05/10/24 and those results were, cholesterol= 144, triglycerides=81, HDL=56 and LDL=72. Cmp was normal. Will forward to dr pietro to see if more labs are needed. Patient is currently taking pravastatin  80 mg daily and has only taken atorvastatin in the past and had issues with it.

## 2024-09-23 MED ORDER — EZETIMIBE 10 MG PO TABS: 10.0000 mg | ORAL_TABLET | Freq: Every day | ORAL | 3 refills | Status: AC

## 2024-09-23 NOTE — Telephone Encounter (Signed)
Spoke with pt, Aware of dr crenshaw's recommendations.  ?New script sent to the pharmacy  ?Lab orders mailed to the pt  ?

## 2024-10-07 ENCOUNTER — Ambulatory Visit (HOSPITAL_COMMUNITY)
Admission: RE | Admit: 2024-10-07 | Discharge: 2024-10-07 | Disposition: A | Discharge status: Home / Self Care | Source: Ambulatory Visit | Attending: Cardiology | Admitting: Cardiology

## 2024-10-07 DIAGNOSIS — I359 Nonrheumatic aortic valve disorder, unspecified: Secondary | ICD-10-CM | POA: Insufficient documentation

## 2024-10-07 LAB — ECHOCARDIOGRAM COMPLETE
AR max vel: 0.97 cm2
AV Area VTI: 1.01 cm2
AV Area mean vel: 0.86 cm2
AV Mean grad: 32 mmHg
AV Peak grad: 53 mmHg
Ao pk vel: 3.64 m/s
Area-P 1/2: 3.86 cm2
P 1/2 time: 157 ms
S' Lateral: 2.89 cm

## 2025-02-16 ENCOUNTER — Ambulatory Visit: Admitting: Cardiology
